# Patient Record
Sex: Male | Born: 1940
Health system: Southern US, Community
[De-identification: ages and names within clinical notes are randomized; demographics above are authoritative.]

## PROBLEM LIST (undated history)

## (undated) DIAGNOSIS — H9190 Unspecified hearing loss, unspecified ear: Secondary | ICD-10-CM

## (undated) DIAGNOSIS — Z720 Tobacco use: Secondary | ICD-10-CM

## (undated) DIAGNOSIS — Z860100 Personal history of colon polyps, unspecified: Secondary | ICD-10-CM

## (undated) DIAGNOSIS — J449 Chronic obstructive pulmonary disease, unspecified: Secondary | ICD-10-CM

## (undated) DIAGNOSIS — H02839 Dermatochalasis of unspecified eye, unspecified eyelid: Secondary | ICD-10-CM

## (undated) DIAGNOSIS — I10 Essential (primary) hypertension: Secondary | ICD-10-CM

## (undated) DIAGNOSIS — K760 Fatty (change of) liver, not elsewhere classified: Secondary | ICD-10-CM

## (undated) DIAGNOSIS — E7800 Pure hypercholesterolemia, unspecified: Secondary | ICD-10-CM

## (undated) DIAGNOSIS — R0989 Other specified symptoms and signs involving the circulatory and respiratory systems: Secondary | ICD-10-CM

## (undated) DIAGNOSIS — K635 Polyp of colon: Secondary | ICD-10-CM

## (undated) DIAGNOSIS — E119 Type 2 diabetes mellitus without complications: Secondary | ICD-10-CM

## (undated) DIAGNOSIS — J189 Pneumonia, unspecified organism: Secondary | ICD-10-CM

## (undated) DIAGNOSIS — Z87438 Personal history of other diseases of male genital organs: Secondary | ICD-10-CM

## (undated) DIAGNOSIS — M1990 Unspecified osteoarthritis, unspecified site: Secondary | ICD-10-CM

## (undated) HISTORY — DX: Polyp of colon: K63.5

## (undated) HISTORY — DX: Dermatochalasis of unspecified eye, unspecified eyelid: H02.839

## (undated) HISTORY — DX: Pure hypercholesterolemia, unspecified: E78.00

## (undated) HISTORY — PX: BLEPHAROPLASTY: SUR158

## (undated) HISTORY — PX: COLONOSCOPY: SHX174

## (undated) HISTORY — PX: CATARACT EXTRACTION W/ INTRAOCULAR LENS  IMPLANT, BILATERAL: SHX1307

## (undated) HISTORY — DX: Essential (primary) hypertension: I10

## (undated) HISTORY — DX: Tobacco use: Z72.0

## (undated) HISTORY — DX: Personal history of other diseases of male genital organs: Z87.438

## (undated) HISTORY — DX: Type 2 diabetes mellitus without complications: E11.9

## (undated) HISTORY — DX: Unspecified hearing loss, unspecified ear: H91.90

---

## 1999-08-20 ENCOUNTER — Encounter: Admission: RE | Admit: 1999-08-20 | Discharge: 1999-08-20 | Payer: Self-pay | Admitting: Family Medicine

## 1999-08-20 ENCOUNTER — Encounter: Payer: Self-pay | Admitting: Family Medicine

## 2004-04-01 ENCOUNTER — Ambulatory Visit: Payer: Self-pay | Admitting: Internal Medicine

## 2004-04-01 LAB — HM COLONOSCOPY

## 2004-04-14 ENCOUNTER — Ambulatory Visit: Payer: Self-pay | Admitting: Internal Medicine

## 2005-09-27 ENCOUNTER — Ambulatory Visit: Payer: Self-pay | Admitting: Cardiology

## 2010-10-02 ENCOUNTER — Encounter: Payer: Self-pay | Admitting: Family Medicine

## 2011-04-26 ENCOUNTER — Other Ambulatory Visit: Payer: Self-pay | Admitting: Family Medicine

## 2011-04-26 DIAGNOSIS — F17200 Nicotine dependence, unspecified, uncomplicated: Secondary | ICD-10-CM

## 2011-04-30 ENCOUNTER — Ambulatory Visit
Admission: RE | Admit: 2011-04-30 | Discharge: 2011-04-30 | Disposition: A | Discharge status: Home / Self Care | Payer: Medicare Other | Source: Ambulatory Visit | Attending: Family Medicine | Admitting: Family Medicine

## 2011-04-30 DIAGNOSIS — F17200 Nicotine dependence, unspecified, uncomplicated: Secondary | ICD-10-CM

## 2012-02-17 ENCOUNTER — Other Ambulatory Visit: Payer: Self-pay | Admitting: Family Medicine

## 2012-02-17 ENCOUNTER — Ambulatory Visit
Admission: RE | Admit: 2012-02-17 | Discharge: 2012-02-17 | Disposition: A | Discharge status: Home / Self Care | Payer: Medicare Other | Source: Ambulatory Visit | Attending: Family Medicine | Admitting: Family Medicine

## 2012-02-17 DIAGNOSIS — M545 Low back pain, unspecified: Secondary | ICD-10-CM

## 2012-06-26 ENCOUNTER — Ambulatory Visit: Payer: Self-pay | Admitting: Family Medicine

## 2012-06-30 ENCOUNTER — Ambulatory Visit (INDEPENDENT_AMBULATORY_CARE_PROVIDER_SITE_OTHER): Payer: Medicare Other | Admitting: Family Medicine

## 2012-06-30 ENCOUNTER — Encounter: Payer: Self-pay | Admitting: Family Medicine

## 2012-06-30 VITALS — BP 130/72 | HR 78 | Temp 98.2°F | Resp 14 | Wt 231.0 lb

## 2012-06-30 DIAGNOSIS — L309 Dermatitis, unspecified: Secondary | ICD-10-CM

## 2012-06-30 DIAGNOSIS — L259 Unspecified contact dermatitis, unspecified cause: Secondary | ICD-10-CM

## 2012-06-30 DIAGNOSIS — E119 Type 2 diabetes mellitus without complications: Secondary | ICD-10-CM | POA: Insufficient documentation

## 2012-06-30 MED ORDER — PREDNISONE 20 MG PO TABS: ORAL_TABLET | ORAL | Status: DC

## 2012-06-30 NOTE — Progress Notes (Signed)
  Subjective:    Patient ID: Scott Ward, male    DOB: [birth year], 72 y.o.   MRN: [id number]  HPI  Patient reports a one to two-month history of a rash on his back. It is very dry and pruritic. He did continue with over-the-counter cortisone cream with limited success. He now seems to be spreading to the dorsums of both forearms. He denies any contacts with an itchy rash. He denies any fevers. He denies any weight loss. Past Medical History  Diagnosis Date  . High cholesterol   . High blood pressure   . Nicotine abuse   . HOH (hard of hearing)   . Colon polyp   . History of BPH   . Diabetes mellitus without complication    Current Outpatient Prescriptions on File Prior to Visit  Medication Sig Dispense Refill  . aspirin 81 MG tablet Take 81 mg by mouth daily.        Marland Kitchen lisinopril (PRINIVIL,ZESTRIL) 20 MG tablet Take 20 mg by mouth daily.        . simvastatin (ZOCOR) 10 MG tablet Take 10 mg by mouth at bedtime.        . verapamil (VERELAN PM) 240 MG 24 hr capsule Take 240 mg by mouth at bedtime.         No current facility-administered medications on file prior to visit.   No Known Allergies History   Social History  . Marital Status: Divorced    Spouse Name: N/A    Number of Children: N/A  . Years of Education: N/A   Occupational History  . Not on file.   Social History Main Topics  . Smoking status: Current Every Day Smoker    Types: Cigarettes  . Smokeless tobacco: Not on file     Comment: 10 cigs a day  . Alcohol Use: No  . Drug Use: No  . Sexually Active: Not on file   Other Topics Concern  . Not on file   Social History Narrative  . No narrative on file     Review of Systems  All other systems reviewed and are negative.       Objective:   Physical Exam  Constitutional: He appears well-developed and well-nourished.  Cardiovascular: Normal rate and normal heart sounds.   Pulmonary/Chest: Effort normal. He has wheezes.   skin shows erythematous  plaques on his back, both shoulder blades and the dorsum of both forearms.  The plaques have lichenified striae and fine scale. They have an erythematous base. There are also numerous excoriations around the plaques..        Assessment & Plan:  1. Dermatitis This has the appearance of atopic dermatitis versus lichen sclerosis versus possible mycosis fungoides.  I explained to the patient I am not sure yet of the diagnosis. Recommended prednisone and 20 mg tablets. He is to take 3 tablets on days 1 and 2, 2 tabs on days 3 and 4, 1 tablet on day 5 and 6.  He is to return in one week if it is no better so that I can perform a skin biopsy to evaluate lesion further

## 2012-07-13 ENCOUNTER — Telehealth: Payer: Self-pay | Admitting: Family Medicine

## 2012-07-13 MED ORDER — PREDNISONE 20 MG PO TABS: ORAL_TABLET | ORAL | Status: DC

## 2012-07-13 NOTE — Telephone Encounter (Signed)
He could have 1 more prednisone taper pack.

## 2012-07-13 NOTE — Telephone Encounter (Signed)
Rx Refilled  

## 2012-07-13 NOTE — Telephone Encounter (Signed)
Pt wants to know if you can prescribe him another dose of prednisone please..states he is getting better but feels if he gets another dose then it should go away.

## 2012-08-09 ENCOUNTER — Other Ambulatory Visit: Payer: Self-pay | Admitting: Family Medicine

## 2012-10-11 ENCOUNTER — Telehealth: Payer: Self-pay | Admitting: Family Medicine

## 2012-10-11 MED ORDER — VERAPAMIL HCL ER 240 MG PO CP24: 240.0000 mg | ORAL_CAPSULE | Freq: Every day | ORAL | Status: DC

## 2012-10-11 MED ORDER — LISINOPRIL 20 MG PO TABS: ORAL_TABLET | ORAL | Status: DC

## 2012-10-11 NOTE — Telephone Encounter (Signed)
Rx Refilled  

## 2012-10-11 NOTE — Telephone Encounter (Addendum)
Lisinopril 20 mg tab 1 QD #90 Verapamil ER 240 mg tab 1 QD #90

## 2013-01-09 ENCOUNTER — Other Ambulatory Visit: Payer: Self-pay | Admitting: Family Medicine

## 2013-01-18 ENCOUNTER — Other Ambulatory Visit: Payer: Self-pay | Admitting: Family Medicine

## 2013-01-18 ENCOUNTER — Ambulatory Visit (INDEPENDENT_AMBULATORY_CARE_PROVIDER_SITE_OTHER): Payer: Medicare Other | Admitting: Family Medicine

## 2013-01-18 ENCOUNTER — Ambulatory Visit
Admission: RE | Admit: 2013-01-18 | Discharge: 2013-01-18 | Disposition: A | Discharge status: Home / Self Care | Payer: Medicare Other | Source: Ambulatory Visit | Attending: Family Medicine | Admitting: Family Medicine

## 2013-01-18 ENCOUNTER — Encounter: Payer: Self-pay | Admitting: Family Medicine

## 2013-01-18 VITALS — BP 136/74 | HR 80 | Temp 97.5°F | Resp 20 | Ht 71.0 in | Wt 234.0 lb

## 2013-01-18 DIAGNOSIS — Z125 Encounter for screening for malignant neoplasm of prostate: Secondary | ICD-10-CM

## 2013-01-18 DIAGNOSIS — E785 Hyperlipidemia, unspecified: Secondary | ICD-10-CM

## 2013-01-18 DIAGNOSIS — D485 Neoplasm of uncertain behavior of skin: Secondary | ICD-10-CM

## 2013-01-18 DIAGNOSIS — F17200 Nicotine dependence, unspecified, uncomplicated: Secondary | ICD-10-CM

## 2013-01-18 DIAGNOSIS — I10 Essential (primary) hypertension: Secondary | ICD-10-CM

## 2013-01-18 DIAGNOSIS — Z23 Encounter for immunization: Secondary | ICD-10-CM

## 2013-01-18 LAB — LIPID PANEL
HDL: 37 mg/dL — ABNORMAL LOW (ref >39)
Total CHOL/HDL Ratio: 3.6 Ratio

## 2013-01-18 LAB — CBC WITH DIFFERENTIAL/PLATELET
Basophils Absolute: 0 10*3/uL (ref 0.0–0.1)
Basophils Relative: 0 % (ref 0–1)
Eosinophils Absolute: 0.2 10*3/uL (ref 0.0–0.7)
Eosinophils Relative: 2 % (ref 0–5)
Lymphocytes Relative: 18 % (ref 12–46)
MCV: 92.9 fL (ref 78.0–100.0)
Platelets: 186 10*3/uL (ref 150–400)
RDW: 14.1 % (ref 11.5–15.5)
WBC: 8.4 10*3/uL (ref 4.0–10.5)

## 2013-01-18 LAB — COMPLETE METABOLIC PANEL WITH GFR
ALT: 21 U/L (ref 0–53)
Alkaline Phosphatase: 103 U/L (ref 39–117)
GFR, Est Non African American: 75 mL/min
Glucose, Bld: 130 mg/dL — ABNORMAL HIGH (ref 70–99)
Sodium: 142 mEq/L (ref 135–145)
Total Bilirubin: 0.5 mg/dL (ref 0.3–1.2)
Total Protein: 6.2 g/dL (ref 6.0–8.3)

## 2013-01-18 NOTE — Progress Notes (Signed)
Subjective:    Patient ID: Scott Ward, male    DOB: December 17, 1940, 72 y.o.   MRN: [id number]  HPI Patient is a 72 year old smoker with a history of hypertension hyperlipidemia and borderline diabetes mellitus. He is here for followup for his blood pressure and his cholesterol. He denies any chest pain shortness of breath or dyspnea on exertion. He is wheezing today on exam consistent with his long smoking habit and early emphysema. I saw him last in the summer and gave him a prednisone Dosepak for age eczema-like rash on his back. The rash subsided on prednisone and return immediately off it. He is again complaining of itching on his back. There are widespread erythematous slightly elevated plaques that are approximately 2-3 cm in size all over his back with fine white scale. He is overdue for colonoscopy. He is due for his prostate exam. Given his long smoking history he is also due for chest x-ray which is not had any more than 3 years. Past Medical History  Diagnosis Date  . High cholesterol   . High blood pressure   . Nicotine abuse   . HOH (hard of hearing)   . Colon polyp   . History of BPH   . Diabetes mellitus without complication    Current Outpatient Prescriptions on File Prior to Visit  Medication Sig Dispense Refill  . aspirin 81 MG tablet Take 81 mg by mouth daily.        Marland Kitchen lisinopril (PRINIVIL,ZESTRIL) 20 MG tablet TAKE 1 TABLET BY MOUTH DAILY  30 tablet  5  . simvastatin (ZOCOR) 10 MG tablet TAKE 1 TABLET AT BEDTIME  90 tablet  1  . verapamil (VERELAN PM) 240 MG 24 hr capsule Take 1 capsule (240 mg total) by mouth at bedtime.  30 capsule  5   No current facility-administered medications on file prior to visit.   No Known Allergies History   Social History  . Marital Status: Divorced    Spouse Name: N/A    Number of Children: N/A  . Years of Education: N/A   Occupational History  . Not on file.   Social History Main Topics  . Smoking status: Current Every Day Smoker     Types: Cigarettes  . Smokeless tobacco: Not on file     Comment: 10 cigs a day  . Alcohol Use: No  . Drug Use: No  . Sexual Activity: Not on file   Other Topics Concern  . Not on file   Social History Narrative  . No narrative on file   No family history on file.    Review of Systems  All other systems reviewed and are negative.       Objective:   Physical Exam  Vitals reviewed. Neck: Neck supple. No JVD present. No thyromegaly present.  Cardiovascular: Normal rate, regular rhythm and normal heart sounds.  Exam reveals no gallop and no friction rub.   No murmur heard. Pulmonary/Chest: Effort normal. No respiratory distress. He has wheezes. He has no rales. He exhibits no tenderness.  Abdominal: Soft. Bowel sounds are normal. He exhibits no distension. There is no tenderness. There is no rebound and no guarding.  Musculoskeletal: He exhibits no edema.  Lymphadenopathy:    He has no cervical adenopathy.  Skin: Rash noted. There is erythema.          Assessment & Plan:  1. HTN (hypertension) Patient's blood pressures well controlled. Continue his current medications at the present dosages. I will  check a CMP to evaluate his kidney function as well as his potassium. - COMPLETE METABOLIC PANEL WITH GFR - CBC with Differential  2. HLD (hyperlipidemia) Given his smoking history I recommend an LDL less than 100. I will check a fasting lipid panel today to see if he is at goal. I also want to recheck his fasting blood sugar to see if he now requires medication control his diabetes he - Lipid panel  3. Prostate cancer screening He is due for a PSA and declines digital rectal exam. He also declines a colonoscopy. - PSA, Medicare  4. Smoker unmotivated to quit I recommended smoking cessation. I will schedule the patient for chest x-ray to rule out pulmonary neoplasm. - DG Chest 2 View; Future  5. Neoplasm of uncertain behavior of skin I am unsure what the rash on  his back he is. It could be atopic dermatitis versus lichen planus versus mycosis fungoides. I anesthetized the lesion on his lower left flank with 0.1% lidocaine with epinephrine. Shave biopsy was performed and sent to pathology using sterile technique. Hemostasis was achieved with Drysol. Await the results of biopsy before determining the next course of treatment - Pathology  6. Need for prophylactic vaccination and inoculation against unspecified single disease - Flu Vaccine QUAD 36+ mos IM - Pneumococcal conjugate vaccine 13-valent IM

## 2013-01-24 ENCOUNTER — Encounter: Payer: Self-pay | Admitting: Family Medicine

## 2013-01-24 ENCOUNTER — Other Ambulatory Visit: Payer: Self-pay | Admitting: Family Medicine

## 2013-01-24 MED ORDER — MOMETASONE FUROATE 0.1 % EX CREA: 1.0000 "application " | TOPICAL_CREAM | Freq: Every day | CUTANEOUS | Status: DC

## 2013-01-24 NOTE — Telephone Encounter (Signed)
Med sent to pharmacy.

## 2013-07-04 ENCOUNTER — Other Ambulatory Visit: Payer: Self-pay | Admitting: Family Medicine

## 2013-07-04 NOTE — Telephone Encounter (Signed)
Medication refilled per protocol. 

## 2013-07-22 ENCOUNTER — Other Ambulatory Visit: Payer: Self-pay | Admitting: Family Medicine

## 2013-07-24 ENCOUNTER — Other Ambulatory Visit: Payer: Self-pay | Admitting: Family Medicine

## 2013-07-25 ENCOUNTER — Other Ambulatory Visit: Payer: Self-pay | Admitting: Family Medicine

## 2013-07-25 MED ORDER — VERAPAMIL HCL ER 240 MG PO TBCR: EXTENDED_RELEASE_TABLET | ORAL | Status: DC

## 2013-07-25 NOTE — Telephone Encounter (Signed)
Rx Refilled  

## 2013-10-01 ENCOUNTER — Other Ambulatory Visit: Payer: Self-pay | Admitting: Family Medicine

## 2013-10-12 ENCOUNTER — Encounter: Payer: Self-pay | Admitting: *Deleted

## 2013-10-15 ENCOUNTER — Encounter: Payer: Self-pay | Admitting: Neurology

## 2013-10-15 ENCOUNTER — Ambulatory Visit (INDEPENDENT_AMBULATORY_CARE_PROVIDER_SITE_OTHER): Payer: Medicare HMO | Admitting: Neurology

## 2013-10-15 VITALS — BP 154/85 | HR 79 | Ht 70.5 in | Wt 230.0 lb

## 2013-10-15 DIAGNOSIS — R5381 Other malaise: Secondary | ICD-10-CM

## 2013-10-15 DIAGNOSIS — R5383 Other fatigue: Secondary | ICD-10-CM

## 2013-10-15 DIAGNOSIS — R531 Weakness: Secondary | ICD-10-CM

## 2013-10-15 DIAGNOSIS — H02403 Unspecified ptosis of bilateral eyelids: Secondary | ICD-10-CM

## 2013-10-15 DIAGNOSIS — H02409 Unspecified ptosis of unspecified eyelid: Secondary | ICD-10-CM

## 2013-10-15 DIAGNOSIS — H538 Other visual disturbances: Secondary | ICD-10-CM

## 2013-10-15 NOTE — Progress Notes (Addendum)
[facility]    Provider:  Dr Jaynee Eagles Referring Provider: Susy Frizzle, MD Primary Care Physician:  Odette Fraction, MD  CC:  "Droopy Eyelids"  HPI:  Scott Ward is a 72 y.o. male here as a referral from Dr. Dennard Schaumann for ptosis and vision changes to rule out myasthenia gravis  Patient reports he has had "droopy eyelids" for 8 years with right>left.Unsure of setting it started in but no known inciting factors. Can't see unless he lifts his eyelids. Over the years ptosis has been getting worse, is severe. Has had one surgery so far to pin up his eyelids and now needs another. Son accompanies father and says the patient's father and siblings have also had droopy lids that occurred in their 45s as well. The son also feels his eyelids are starting to droop at the end of the day. In the morning the patient wakes up with the eyelid problems (right worse than left) and gets worse throughout the day. Vision is always blurry and gets worse throughout the day. No fatiguable chewing. No respiratory problems. Can't get out of a chair without using hands, Has difficulty walking up stairs and has to rest. Denies slurred speech or dysarthria. No changes in facial muscles or expression. Denies neck weakness. No other focal neurologic symptoms. Symptoms are progressive especially in the last 2 years with the right eyelid being more and more involved. No FHx of neurodegenerative disease or neuromuscular disease. Had cataract surgery previous to symptoms and doesn't feel that is a factor.   Reviewed notes, labs and imaging from outside physicians, which showed: Ophthalmology office visit from 10/01/2013 sites [facility] right 20/60, [facility] left 20/50. PERRL, no APD, normal slit lamp exam, normal fundoscopic exam,   Review of Systems: Patient complains of symptoms per HPI as well as the following symptoms blurry vision, eye lids, eye sight, and rash. Pertinent negatives per HPI. Otherwise out of a complete  14 system review, and all other reviewed systems are negative.   History   Social History  . Marital Status: Divorced    Spouse Name: N/A    Number of Children: 1  . Years of Education: 8   Occupational History  . RETIRED    Social History Main Topics  . Smoking status: Current Every Day Smoker -- 1.00 packs/day    Types: Cigarettes  . Smokeless tobacco: Never Used     Comment: 10 cigs a day  . Alcohol Use: No  . Drug Use: No  . Sexual Activity: Not on file   Other Topics Concern  . Not on file   Social History Narrative   Patient is single with one child.   Patient is left handed.   Patient has an 8 th grade education.   Patient drinks up to 3 cups daily.    No family history on file.Father died of heart attack. Grandfather and siblings and sons with droopy eyelids later in life. Otherwise no FHx of neuromuscular or neurodegenerative disorders.  Past Medical History  Diagnosis Date  . High cholesterol   . High blood pressure   . Nicotine abuse   . HOH (hard of hearing)   . Colon polyp   . History of BPH   . Diabetes mellitus without complication   . Dermatochalasis     Past Surgical History  Procedure Laterality Date  . Blepharoplasty    . Cataract extraction      Current Outpatient Prescriptions  Medication Sig Dispense Refill  . aspirin  81 MG tablet Take 81 mg by mouth daily.        Marland Kitchen lisinopril (PRINIVIL,ZESTRIL) 20 MG tablet TAKE 1 TABLET BY MOUTH DAILY  30 tablet  5  . mometasone (ELOCON) 0.1 % cream Apply 1 application topically daily.  45 g  1  . simvastatin (ZOCOR) 10 MG tablet TAKE 1 TABLET BY MOUTH AT BEDTIME  90 tablet  0  . verapamil (CALAN-SR) 240 MG CR tablet Take 240 mg by mouth daily. TAKE 1 TABLET AT BEDTIME       No current facility-administered medications for this visit.    Allergies as of 10/15/2013  . (No Known Allergies)    Vitals: BP 154/85  Pulse 79  Ht 5' 10.5" (1.791 m)  Wt 230 lb (104.327 kg)  BMI 32.52 kg/m2 Last  Weight:  Wt Readings from Last 1 Encounters:  10/15/13 230 lb (104.327 kg)   Last Height:   Ht Readings from Last 1 Encounters:  10/15/13 5' 10.5" (1.791 m)     Physical exam: Exam: Gen: NAD, conversant Eyes: anicteric sclerae, moist conjunctivae HENT: Atraumatic, oropharynx clear Neck: Trachea midline; supple,  Lungs: Decrease breath sounds at the bases, +rales                     CV: RRR, no MRG Abdomen: Soft, non-tender; obese Extremities: No peripheral edema  Skin: Normal temperature, no apparent rash,  Psych: Appropriate affect, pleasant   Neuro: Detailed Neurologic Exam   Speech:    Speech is normal; fluent and spontaneous with normal comprehension. No nasal speech.  Cognition:    The patient is oriented to person, place, and time;   Cranial Nerves:    The pupils are equal, round, and reactive to light. The fundi are normal. Visual fields are full to finger confrontation if eyelids are manually raised. Extraocular movements are intact. fatigueable ptosis with prolonged up gaze.  Trigeminal sensation is intact and the muscles of mastication are normal. Ptosis right > left otherwise the face is symmetric. Ice pack test left eye appears to be positive, right eye negative. The palate elevates in the midline. Voice is normal. Shoulder shrug is normal. The tongue has normal motion without fasciculations.   Coordination:    Normal finger to nose and heel to shin.   Gait:    Heel-toe and tandem gait are normal.     Motor Observation:    No asymmetry, no atrophy, and no involuntary movements noted. No pronator drift.  Tone:    Normal muscle tone.   Posture:    Posture is normal.     Strength: Mild bilateral hip flexion weakness otherwise strength is V/V in the upper and lower limbs.    Light Touch:    Normal light touch sensation in upper and lower extremities.   impaired right upper extremity.    Proprioception:    Normal proprioception in upper and lower  extremities. Romberg negative.  Reflex Exam:  DTR's:    Deep tendon reflexes in the upper extremities are brisk for age but symmetrical, and lower extremities are normal bilaterally.   Toes:    The toes are downgoing bilaterally.  Clonus:    Clonus is absent.     Assessment/plan: 73 year old male with a PMHx of HTN, HLD who is here for 8 years of worsening ptosis and blurred vision. He was referred for evaluation of Myasthenia Gravis. Patient does endorse worsening ptosis and blurry vision throughout the day as well as  weakness getting out of chairs, going up stairs and holding arms overhead. Denies dysphagia, dysarthria, changes in nasal quality or facial strength, respiratory symptoms or fatiguable chewing. Reports that there is a family history of late-onset ptosis in his son, brother, sister and father. Neuro exam significant for fatiguable ptosis and extra-occular muscles on prolonged upgaze and possibly improved left ptosis with ice-pack test in the office.  Will order workup for AchR antibodies and antibodies to rule out lambert eaton. If seronegative can consider late-onset congenital/hereditary myasthenic syndrome considering family history. Discussed EMG/NCS and they would prefer to wait for results of blood work to consider any further tests. Wil call son [name] at [phone]. Explained if patient has any respiratory difficulty or symptoms progress rapidly that they should proceed to the emergency room or call 911.    Harlene Salts, [facility] [address] [place], [place] [address]  Phone [phone] Fax [phone]   Lenor Coffin

## 2013-10-15 NOTE — Patient Instructions (Signed)
Overall you are doing fairly well but I do want to suggest a few things today:   Remember to drink plenty of fluid, eat healthy meals and do not skip any meals. Try to eat protein with a every meal and eat a healthy snack such as fruit or nuts in between meals. Try to keep a regular sleep-wake schedule and try to exercise daily, particularly in the form of walking, 20-30 minutes a day, if you can.    As far as diagnostic testing: please proceed to lab today   Please call us with any interim questions, concerns, problems, updates or refill requests.   I will call you so we can go over results with you on the phone and consider next steps.  My clinical assistant and I will answer any of your questions and relay your messages to me and also relay most of my messages to you.   Our phone number is [phone]. We also have an after hours call service for urgent matters and there is a physician on-call for urgent questions. For any emergencies you know to call 911 or go to the nearest emergency room

## 2013-10-20 LAB — ACETYLCHOLINE RECEPTOR, MODULATING: Acetylcholine Rec Mod Ab: <12 % (ref 0–20)

## 2013-10-20 LAB — ACETYLCHOLINE RECEPTOR, BINDING

## 2013-10-20 LAB — ACETYLCHOLINE RECEPTOR, BLOCKING: AChR Blocking Abs, Serum: 20 % (ref 0–25)

## 2013-10-20 LAB — VGCC ANTIBODY: VGCC ANTIBODY: NEGATIVE

## 2013-10-28 ENCOUNTER — Other Ambulatory Visit: Payer: Self-pay | Admitting: Family Medicine

## 2013-10-29 NOTE — Telephone Encounter (Signed)
Refill appropriate and filled per protocol. 

## 2014-01-05 ENCOUNTER — Other Ambulatory Visit: Payer: Self-pay | Admitting: Family Medicine

## 2014-01-07 NOTE — Telephone Encounter (Signed)
Med refilled for 30 days only, needs ov

## 2014-05-06 ENCOUNTER — Other Ambulatory Visit: Payer: Self-pay | Admitting: Family Medicine

## 2014-05-06 ENCOUNTER — Encounter: Payer: Self-pay | Admitting: Family Medicine

## 2014-05-06 NOTE — Telephone Encounter (Signed)
Pt has not been seen since 12/2012.  One month refill only.  Letter sent to make appt.

## 2014-05-17 ENCOUNTER — Other Ambulatory Visit (INDEPENDENT_AMBULATORY_CARE_PROVIDER_SITE_OTHER): Payer: Medicare HMO

## 2014-05-17 ENCOUNTER — Ambulatory Visit: Payer: Medicare HMO | Admitting: Family Medicine

## 2014-05-17 DIAGNOSIS — Z23 Encounter for immunization: Secondary | ICD-10-CM

## 2014-05-17 DIAGNOSIS — I10 Essential (primary) hypertension: Secondary | ICD-10-CM | POA: Diagnosis not present

## 2014-05-17 DIAGNOSIS — E119 Type 2 diabetes mellitus without complications: Secondary | ICD-10-CM | POA: Diagnosis not present

## 2014-05-17 DIAGNOSIS — Z0000 Encounter for general adult medical examination without abnormal findings: Secondary | ICD-10-CM | POA: Diagnosis not present

## 2014-05-17 LAB — LIPID PANEL
CHOLESTEROL: 145 mg/dL (ref 0–200)
HDL: 37 mg/dL — ABNORMAL LOW (ref >=40)
LDL Cholesterol: 78 mg/dL (ref 0–99)
TRIGLYCERIDES: 150 mg/dL — AB (ref <150)
Total CHOL/HDL Ratio: 3.9 Ratio
VLDL: 30 mg/dL (ref 0–40)

## 2014-05-17 LAB — CBC WITH DIFFERENTIAL/PLATELET
Basophils Absolute: 0 10*3/uL (ref 0.0–0.1)
Basophils Relative: 0 % (ref 0–1)
EOS ABS: 0.2 10*3/uL (ref 0.0–0.7)
Eosinophils Relative: 2 % (ref 0–5)
HEMATOCRIT: 49.5 % (ref 39.0–52.0)
HEMOGLOBIN: 16.9 g/dL (ref 13.0–17.0)
LYMPHS ABS: 2.1 10*3/uL (ref 0.7–4.0)
Lymphocytes Relative: 26 % (ref 12–46)
MCH: 31.8 pg (ref 26.0–34.0)
MCHC: 34.1 g/dL (ref 30.0–36.0)
MCV: 93 fL (ref 78.0–100.0)
MONO ABS: 0.7 10*3/uL (ref 0.1–1.0)
MONOS PCT: 9 % (ref 3–12)
MPV: 10.5 fL (ref 8.6–12.4)
Neutro Abs: 5 10*3/uL (ref 1.7–7.7)
Neutrophils Relative %: 63 % (ref 43–77)
Platelets: 191 10*3/uL (ref 150–400)
RBC: 5.32 MIL/uL (ref 4.22–5.81)
RDW: 14.3 % (ref 11.5–15.5)
WBC: 8 10*3/uL (ref 4.0–10.5)

## 2014-05-17 LAB — COMPLETE METABOLIC PANEL WITH GFR
ALBUMIN: 3.8 g/dL (ref 3.5–5.2)
ALK PHOS: 102 U/L (ref 39–117)
ALT: 18 U/L (ref 0–53)
AST: 17 U/L (ref 0–37)
BILIRUBIN TOTAL: 0.5 mg/dL (ref 0.2–1.2)
BUN: 17 mg/dL (ref 6–23)
CO2: 24 mEq/L (ref 19–32)
Calcium: 8.9 mg/dL (ref 8.4–10.5)
Chloride: 104 mEq/L (ref 96–112)
Creat: 0.84 mg/dL (ref 0.50–1.35)
GFR, EST NON AFRICAN AMERICAN: 86 mL/min
GFR, Est African American: >89 mL/min
GLUCOSE: 125 mg/dL — AB (ref 70–99)
POTASSIUM: 4.3 meq/L (ref 3.5–5.3)
Sodium: 140 mEq/L (ref 135–145)
Total Protein: 6.4 g/dL (ref 6.0–8.3)

## 2014-05-17 LAB — HEMOGLOBIN A1C
Hgb A1c MFr Bld: 6.8 % — ABNORMAL HIGH (ref <5.7)
MEAN PLASMA GLUCOSE: 148 mg/dL — AB (ref <117)

## 2014-05-18 LAB — PSA, MEDICARE: PSA: 1.35 ng/mL (ref <=4.00)

## 2014-05-20 ENCOUNTER — Ambulatory Visit (INDEPENDENT_AMBULATORY_CARE_PROVIDER_SITE_OTHER): Payer: Medicare HMO | Admitting: Family Medicine

## 2014-05-20 ENCOUNTER — Encounter: Payer: Self-pay | Admitting: Family Medicine

## 2014-05-20 VITALS — BP 136/84 | HR 80 | Temp 98.0°F | Resp 20 | Ht 71.0 in | Wt 235.0 lb

## 2014-05-20 DIAGNOSIS — Z23 Encounter for immunization: Secondary | ICD-10-CM

## 2014-05-20 DIAGNOSIS — E785 Hyperlipidemia, unspecified: Secondary | ICD-10-CM | POA: Diagnosis not present

## 2014-05-20 DIAGNOSIS — I10 Essential (primary) hypertension: Secondary | ICD-10-CM | POA: Diagnosis not present

## 2014-05-20 DIAGNOSIS — IMO0002 Reserved for concepts with insufficient information to code with codable children: Secondary | ICD-10-CM

## 2014-05-20 DIAGNOSIS — E1165 Type 2 diabetes mellitus with hyperglycemia: Secondary | ICD-10-CM | POA: Diagnosis not present

## 2014-05-20 NOTE — Addendum Note (Signed)
Addended by: Shary Decamp B on: 05/20/2014 03:55 PM   Modules accepted: Orders

## 2014-05-20 NOTE — Progress Notes (Signed)
Subjective:    Patient ID: Scott Ward, male    DOB: 08-24-1940, 74 y.o.   MRN: [id number]  HPI Patient is a very pleasant 74 year old white male who is here today for a checkup. His blood pressures well controlled at 136/84. He denies any chest pain shortness of breath or dyspnea on exertion. Unfortunately the patient continues to smoke. He denies any cough or hemoptysis. He is wheezing on examination today but he denies any shortness of breath. He is also on simvastatin 10 mg a day for hyperlipidemia. He denies any myalgias or right upper quadrant pain. His most recent lab work as listed below. His blood sugar test also continues to rise. His Hemoccult A1c is now up to 6.8. He is currently drinking a lot of sweets. Eating a lot of bread. Lab on 05/17/2014  Component Date Value Ref Range Status  . WBC 05/17/2014 8.0  4.0 - 10.5 K/uL Final  . RBC 05/17/2014 5.32  4.22 - 5.81 MIL/uL Final  . Hemoglobin 05/17/2014 16.9  13.0 - 17.0 g/dL Final  . HCT 05/17/2014 49.5  39.0 - 52.0 % Final  . MCV 05/17/2014 93.0  78.0 - 100.0 fL Final  . MCH 05/17/2014 31.8  26.0 - 34.0 pg Final  . MCHC 05/17/2014 34.1  30.0 - 36.0 g/dL Final  . RDW 05/17/2014 14.3  11.5 - 15.5 % Final  . Platelets 05/17/2014 191  150 - 400 K/uL Final  . MPV 05/17/2014 10.5  8.6 - 12.4 fL Final  . Neutrophils Relative % 05/17/2014 63  43 - 77 % Final  . Neutro Abs 05/17/2014 5.0  1.7 - 7.7 K/uL Final  . Lymphocytes Relative 05/17/2014 26  12 - 46 % Final  . Lymphs Abs 05/17/2014 2.1  0.7 - 4.0 K/uL Final  . Monocytes Relative 05/17/2014 9  3 - 12 % Final  . Monocytes Absolute 05/17/2014 0.7  0.1 - 1.0 K/uL Final  . Eosinophils Relative 05/17/2014 2  0 - 5 % Final  . Eosinophils Absolute 05/17/2014 0.2  0.0 - 0.7 K/uL Final  . Basophils Relative 05/17/2014 0  0 - 1 % Final  . Basophils Absolute 05/17/2014 0.0  0.0 - 0.1 K/uL Final  . Smear Review 05/17/2014 Criteria for review not met   Final  . Sodium 05/17/2014 140  135 -  145 mEq/L Final  . Potassium 05/17/2014 4.3  3.5 - 5.3 mEq/L Final  . Chloride 05/17/2014 104  96 - 112 mEq/L Final  . CO2 05/17/2014 24  19 - 32 mEq/L Final  . Glucose, Bld 05/17/2014 125* 70 - 99 mg/dL Final  . BUN 05/17/2014 17  6 - 23 mg/dL Final  . Creat 05/17/2014 0.84  0.50 - 1.35 mg/dL Final  . Total Bilirubin 05/17/2014 0.5  0.2 - 1.2 mg/dL Final  . Alkaline Phosphatase 05/17/2014 102  39 - 117 U/L Final  . AST 05/17/2014 17  0 - 37 U/L Final  . ALT 05/17/2014 18  0 - 53 U/L Final  . Total Protein 05/17/2014 6.4  6.0 - 8.3 g/dL Final  . Albumin 05/17/2014 3.8  3.5 - 5.2 g/dL Final  . Calcium 05/17/2014 8.9  8.4 - 10.5 mg/dL Final  . GFR, Est African American 05/17/2014 >89   Final  . GFR, Est Non African American 05/17/2014 86   Final   Comment:   The estimated GFR is a calculation valid for adults (>=9 years old) that uses the CKD-EPI algorithm to adjust for  age and sex. It is   not to be used for children, pregnant women, hospitalized patients,    patients on dialysis, or with rapidly changing kidney function. According to the NKDEP, eGFR >89 is normal, 60-89 shows mild impairment, 30-59 shows moderate impairment, 15-29 shows severe impairment and <15 is ESRD.     Marland Kitchen Cholesterol 05/17/2014 145  0 - 200 mg/dL Final   Comment: ATP III Classification:       < 200        mg/dL        Desirable      200 - 239     mg/dL        Borderline High      >= 240        mg/dL        High     . Triglycerides 05/17/2014 150* <150 mg/dL Final  . HDL 05/17/2014 37* >=40 mg/dL Final   ** Please note change in reference range(s). **  . Total CHOL/HDL Ratio 05/17/2014 3.9   Final  . VLDL 05/17/2014 30  0 - 40 mg/dL Final  . LDL Cholesterol 05/17/2014 78  0 - 99 mg/dL Final   Comment:   Total Cholesterol/HDL Ratio:CHD Risk                        Coronary Heart Disease Risk Table                                        Men       Women          1/2 Average Risk              3.4         3.3              Average Risk              5.0        4.4           2X Average Risk              9.6        7.1           3X Average Risk             23.4       11.0 Use the calculated Patient Ratio above and the CHD Risk table  to determine the patient's CHD Risk. ATP III Classification (LDL):       < 100        mg/dL         Optimal      100 - 129     mg/dL         Near or Above Optimal      130 - 159     mg/dL         Borderline High      160 - 189     mg/dL         High       > 190        mg/dL         Very High     . Hgb A1c MFr Bld 05/17/2014 6.8* <5.7 % Final   Comment:  According to the ADA Clinical Practice Recommendations for 2011, when HbA1c is used as a screening test:     >=6.5%   Diagnostic of Diabetes Mellitus            (if abnormal result is confirmed)   5.7-6.4%   Increased risk of developing Diabetes Mellitus   References:Diagnosis and Classification of Diabetes Mellitus,Diabetes PJAS,5053,97(QBHAL 1):S62-S69 and Standards of Medical Care in         Diabetes - 2011,Diabetes PFXT,0240,97 (Suppl 1):S11-S61.     . Mean Plasma Glucose 05/17/2014 148* <117 mg/dL Final  . PSA 05/17/2014 1.35  <=4.00 ng/mL Final   Comment: Test Methodology: ECLIA PSA (Electrochemiluminescence Immunoassay)   For PSA values from 2.5-4.0, particularly in younger men <60 years old, the AUA and NCCN suggest testing for % Free PSA (3515) and evaluation of the rate of increase in PSA (PSA velocity).    Past Medical History  Diagnosis Date  . High cholesterol   . High blood pressure   . Nicotine abuse   . HOH (hard of hearing)   . Colon polyp   . History of BPH   . Diabetes mellitus without complication   . Dermatochalasis    Past Surgical History  Procedure Laterality Date  . Blepharoplasty    . Cataract extraction     Current Outpatient Prescriptions on File Prior to Visit  Medication Sig Dispense Refill   . aspirin 81 MG tablet Take 81 mg by mouth daily.      Marland Kitchen lisinopril (PRINIVIL,ZESTRIL) 20 MG tablet TAKE 1 TABLET DAILY 30 tablet 0  . simvastatin (ZOCOR) 10 MG tablet TAKE 1 TABLET BY MOUTH AT BEDTIME 30 tablet 1  . verapamil (CALAN-SR) 240 MG CR tablet Take 240 mg by mouth daily. TAKE 1 TABLET AT BEDTIME     No current facility-administered medications on file prior to visit.   No Known Allergies History   Social History  . Marital Status: Divorced    Spouse Name: N/A  . Number of Children: 1  . Years of Education: 8   Occupational History  . RETIRED    Social History Main Topics  . Smoking status: Current Every Day Smoker -- 1.00 packs/day    Types: Cigarettes  . Smokeless tobacco: Never Used     Comment: 10 cigs a day  . Alcohol Use: No  . Drug Use: No  . Sexual Activity: Not on file   Other Topics Concern  . Not on file   Social History Narrative   Patient is single with one child.   Patient is left handed.   Patient has an 8 th grade education.   Patient drinks up to 3 cups daily.      Review of Systems  All other systems reviewed and are negative.      Objective:   Physical Exam  Constitutional: He appears well-developed and well-nourished. No distress.  HENT:  Right Ear: External ear normal.  Left Ear: External ear normal.  Nose: Nose normal.  Mouth/Throat: Oropharynx is clear and moist. No oropharyngeal exudate.  Neck: Neck supple. No JVD present. No thyromegaly present.  Cardiovascular: Normal rate, regular rhythm, normal heart sounds and intact distal pulses.   No murmur heard. Pulmonary/Chest: Effort normal and breath sounds normal. No respiratory distress. He has no wheezes. He has no rales.  Abdominal: Soft. Bowel sounds are normal. He exhibits no distension and no mass. There is no tenderness. There is no rebound and no guarding.  Musculoskeletal: He  exhibits no edema.  Lymphadenopathy:    He has no cervical adenopathy.  Skin: He is not  diaphoretic.  Vitals reviewed.         Assessment & Plan:  HLD (hyperlipidemia)  Benign essential HTN  Diabetes mellitus type II, uncontrolled  Patient's blood pressures well controlled. I will make no changes in his simvastatin. His blood pressure is adequately controlled on make no changes in his lisinopril or his verapamil. Unfortunately his blood sugar is elevated. His goal hemoglobin A1c is less than 6.5. We had a long discussion regarding low carbohydrate diet. I will recheck his hemoglobin A1c in 6 months. I've also recommended smoking cessation. Patient received Pneumovax 23 today in clinic. This completes his vaccination series. His PSA is normal. I did recommend a colonoscopy but the patient refused.

## 2014-06-12 ENCOUNTER — Other Ambulatory Visit: Payer: Self-pay | Admitting: Family Medicine

## 2014-06-12 NOTE — Telephone Encounter (Signed)
Refill appropriate and filled per protocol. 

## 2014-08-26 ENCOUNTER — Other Ambulatory Visit: Payer: Self-pay | Admitting: Family Medicine

## 2014-08-26 MED ORDER — LISINOPRIL 20 MG PO TABS: 20.0000 mg | ORAL_TABLET | Freq: Every day | ORAL | Status: DC

## 2014-08-26 MED ORDER — VERAPAMIL HCL ER 240 MG PO TBCR: 240.0000 mg | EXTENDED_RELEASE_TABLET | Freq: Every day | ORAL | Status: DC

## 2014-10-04 ENCOUNTER — Other Ambulatory Visit: Payer: Self-pay | Admitting: Family Medicine

## 2014-10-04 NOTE — Telephone Encounter (Signed)
Refill appropriate and filled per protocol. 

## 2014-10-15 ENCOUNTER — Other Ambulatory Visit: Payer: Self-pay | Admitting: Family Medicine

## 2014-10-15 NOTE — Telephone Encounter (Signed)
Refill appropriate and filled per protocol. 

## 2014-11-22 ENCOUNTER — Encounter: Payer: Self-pay | Admitting: Family Medicine

## 2014-11-22 ENCOUNTER — Ambulatory Visit (INDEPENDENT_AMBULATORY_CARE_PROVIDER_SITE_OTHER): Payer: Medicare HMO | Admitting: Family Medicine

## 2014-11-22 VITALS — BP 140/80 | HR 82 | Temp 97.8°F | Resp 20 | Ht 71.0 in | Wt 232.0 lb

## 2014-11-22 DIAGNOSIS — E119 Type 2 diabetes mellitus without complications: Secondary | ICD-10-CM

## 2014-11-22 DIAGNOSIS — E785 Hyperlipidemia, unspecified: Secondary | ICD-10-CM

## 2014-11-22 DIAGNOSIS — I10 Essential (primary) hypertension: Secondary | ICD-10-CM | POA: Diagnosis not present

## 2014-11-22 DIAGNOSIS — J438 Other emphysema: Secondary | ICD-10-CM

## 2014-11-22 DIAGNOSIS — Z23 Encounter for immunization: Secondary | ICD-10-CM

## 2014-11-22 LAB — CBC WITH DIFFERENTIAL/PLATELET
Basophils Absolute: 0.1 10*3/uL (ref 0.0–0.1)
Basophils Relative: 1 % (ref 0–1)
Eosinophils Absolute: 0.2 10*3/uL (ref 0.0–0.7)
Eosinophils Relative: 3 % (ref 0–5)
HEMATOCRIT: 47.9 % (ref 39.0–52.0)
HEMOGLOBIN: 16.6 g/dL (ref 13.0–17.0)
LYMPHS PCT: 19 % (ref 12–46)
Lymphs Abs: 1.6 10*3/uL (ref 0.7–4.0)
MCH: 32.2 pg (ref 26.0–34.0)
MCHC: 34.7 g/dL (ref 30.0–36.0)
MCV: 92.8 fL (ref 78.0–100.0)
MONO ABS: 0.7 10*3/uL (ref 0.1–1.0)
MONOS PCT: 8 % (ref 3–12)
MPV: 10.5 fL (ref 8.6–12.4)
NEUTROS ABS: 5.7 10*3/uL (ref 1.7–7.7)
Neutrophils Relative %: 69 % (ref 43–77)
Platelets: 203 10*3/uL (ref 150–400)
RBC: 5.16 MIL/uL (ref 4.22–5.81)
RDW: 14.7 % (ref 11.5–15.5)
WBC: 8.2 10*3/uL (ref 4.0–10.5)

## 2014-11-22 LAB — HEMOGLOBIN A1C
Hgb A1c MFr Bld: 7.2 % — ABNORMAL HIGH (ref <5.7)
MEAN PLASMA GLUCOSE: 160 mg/dL — AB (ref <117)

## 2014-11-22 LAB — COMPLETE METABOLIC PANEL WITH GFR
ALBUMIN: 3.9 g/dL (ref 3.6–5.1)
ALK PHOS: 98 U/L (ref 40–115)
ALT: 23 U/L (ref 9–46)
AST: 19 U/L (ref 10–35)
BILIRUBIN TOTAL: 0.5 mg/dL (ref 0.2–1.2)
BUN: 19 mg/dL (ref 7–25)
CALCIUM: 8.8 mg/dL (ref 8.6–10.3)
CO2: 28 mmol/L (ref 20–31)
CREATININE: 0.93 mg/dL (ref 0.70–1.18)
Chloride: 104 mmol/L (ref 98–110)
GFR, Est African American: >89 mL/min (ref >=60)
GFR, Est Non African American: 80 mL/min (ref >=60)
Glucose, Bld: 134 mg/dL — ABNORMAL HIGH (ref 70–99)
POTASSIUM: 4.2 mmol/L (ref 3.5–5.3)
Sodium: 139 mmol/L (ref 135–146)
Total Protein: 6.2 g/dL (ref 6.1–8.1)

## 2014-11-22 LAB — LIPID PANEL
CHOL/HDL RATIO: 3.2 ratio (ref <=5.0)
CHOLESTEROL: 134 mg/dL (ref 125–200)
HDL: 42 mg/dL (ref >=40)
LDL Cholesterol: 70 mg/dL (ref <130)
Triglycerides: 109 mg/dL (ref <150)
VLDL: 22 mg/dL (ref <30)

## 2014-11-22 MED ORDER — MOMETASONE FUROATE 0.1 % EX CREA: 1.0000 "application " | TOPICAL_CREAM | Freq: Every day | CUTANEOUS | Status: DC

## 2014-11-22 MED ORDER — ALBUTEROL SULFATE HFA 108 (90 BASE) MCG/ACT IN AERS
2.0000 | INHALATION_SPRAY | Freq: Four times a day (QID) | RESPIRATORY_TRACT | Status: DC | PRN
Start: 2014-11-22 — End: 2016-03-08

## 2014-11-22 NOTE — Progress Notes (Signed)
Subjective:    Patient ID: Scott Ward, male    DOB: Nov 29, 1940, 74 y.o.   MRN: [id number]  HPI 05/20/14 Patient is a very pleasant 74 year old white male who is here today for a checkup. His blood pressures well controlled at 136/84. He denies any chest pain shortness of breath or dyspnea on exertion. Unfortunately the patient continues to smoke. He denies any cough or hemoptysis. He is wheezing on examination today but he denies any shortness of breath. He is also on simvastatin 10 mg a day for hyperlipidemia. He denies any myalgias or right upper quadrant pain. His most recent lab work as listed below. His blood sugar test also continues to rise. His Hemoccult A1c is now up to 6.8. He is currently drinking a lot of sweets. Eating a lot of bread.  At thattime, my plkan was: Patient's blood pressures well controlled. I will make no changes in his simvastatin. His blood pressure is adequately controlled on make no changes in his lisinopril or his verapamil. Unfortunately his blood sugar is elevated. His goal hemoglobin A1c is less than 6.5. We had a long discussion regarding low carbohydrate diet. I will recheck his hemoglobin A1c in 6 months. I've also recommended smoking cessation. Patient received Pneumovax 23 today in clinic. This completes his vaccination series. His PSA is normal. I did recommend a colonoscopy but the patient refused. 11/22/14 Patient is here today for follow-up. He is not adhering to a low carbohydrate diet. He continues to smoke one pack of cigarettes per day. He is taking an aspirin 81 mg per day. He has audible wheezing today on his physical exam. He does report shortness of breath with exertion and easy fatigability. His blood pressure is adequately controlled at 140/80. He denies any chest pain or angina. He denies any myalgias or right upper quadrant pain. He denies any polyuria, polydipsia, or blurred vision Past Medical History  Diagnosis Date  . High cholesterol   .  High blood pressure   . Nicotine abuse   . HOH (hard of hearing)   . Colon polyp   . History of BPH   . Diabetes mellitus without complication   . Dermatochalasis    Past Surgical History  Procedure Laterality Date  . Blepharoplasty    . Cataract extraction     Current Outpatient Prescriptions on File Prior to Visit  Medication Sig Dispense Refill  . aspirin 81 MG tablet Take 81 mg by mouth daily.      Marland Kitchen lisinopril (PRINIVIL,ZESTRIL) 20 MG tablet Take 1 tablet (20 mg total) by mouth daily. 90 tablet 3  . simvastatin (ZOCOR) 10 MG tablet TAKE 1 TABLET BY MOUTH EVERY NIGHT AT BEDTIME 30 tablet 3  . verapamil (CALAN-SR) 240 MG CR tablet TAKE 1 TABLET BY MOUTH EVERY NIGHT AT BEDTIME 30 tablet 2   No current facility-administered medications on file prior to visit.   No Known Allergies Social History   Social History  . Marital Status: Divorced    Spouse Name: N/A  . Number of Children: 1  . Years of Education: 8   Occupational History  . RETIRED    Social History Main Topics  . Smoking status: Current Every Day Smoker -- 1.00 packs/day    Types: Cigarettes  . Smokeless tobacco: Never Used     Comment: 10 cigs a day  . Alcohol Use: No  . Drug Use: No  . Sexual Activity: Not on file   Other Topics Concern  .  Not on file   Social History Narrative   Patient is single with one child.   Patient is left handed.   Patient has an 8 th grade education.   Patient drinks up to 3 cups daily.      Review of Systems  All other systems reviewed and are negative.      Objective:   Physical Exam  Constitutional: He appears well-developed and well-nourished. No distress.  HENT:  Right Ear: External ear normal.  Left Ear: External ear normal.  Nose: Nose normal.  Mouth/Throat: Oropharynx is clear and moist. No oropharyngeal exudate.  Neck: Neck supple. No JVD present. No thyromegaly present.  Cardiovascular: Normal rate, regular rhythm, normal heart sounds and intact  distal pulses.   No murmur heard. Pulmonary/Chest: Effort normal and breath sounds normal. No respiratory distress. He has no wheezes. He has no rales.  Abdominal: Soft. Bowel sounds are normal. He exhibits no distension and no mass. There is no tenderness. There is no rebound and no guarding.  Musculoskeletal: He exhibits no edema.  Lymphadenopathy:    He has no cervical adenopathy.  Skin: He is not diaphoretic.  Vitals reviewed.         Assessment & Plan:  Diabetes mellitus type II, controlled - Plan: CBC with Differential/Platelet, COMPLETE METABOLIC PANEL WITH GFR, Lipid panel, Hemoglobin A1c, Microalbumin, urine, Flu Vaccine QUAD 36+ mos IM  Other emphysema - Plan: albuterol (PROVENTIL HFA;VENTOLIN HFA) 108 (90 BASE) MCG/ACT inhaler  Need for prophylactic vaccination and inoculation against influenza - Plan: Flu Vaccine QUAD 36+ mos IM  Benign essential HTN  HLD (hyperlipidemia)  I believe his shortness of breath is likely due to emphysema. I continue to strongly encourage the patient quit smoking. Meanwhile I'll start him on albuterol 2 puffs inhaled every 6 hours as needed. I will check a fasting lipid panel. Goal LDL cholesterol is less than 100. I will also check a hemoglobin A1c. Goal hemoglobin A1c is less than 6.5. I spent 5-10 minutes discussing a low carbohydrate diet. I believe his blood pressure is adequately controlled today at 140/80.  Patient's pneumonia vaccines are up-to-date. He received his flu shot today.

## 2014-11-23 LAB — MICROALBUMIN, URINE: Microalb, Ur: <0.2 mg/dL (ref <2.0)

## 2014-11-27 ENCOUNTER — Encounter: Payer: Self-pay | Admitting: Family Medicine

## 2014-11-29 ENCOUNTER — Telehealth: Payer: Self-pay | Admitting: *Deleted

## 2014-11-29 MED ORDER — METFORMIN HCL 500 MG PO TABS: 500.0000 mg | ORAL_TABLET | Freq: Two times a day (BID) | ORAL | Status: DC

## 2014-11-29 NOTE — Telephone Encounter (Signed)
Pt called stating that he called his pharmacy to see if his metformin was ready and they did not have it, I filled the medication while on phone and informed him that it is at pharmacy now.

## 2015-01-31 ENCOUNTER — Other Ambulatory Visit: Payer: Self-pay | Admitting: Family Medicine

## 2015-07-02 ENCOUNTER — Other Ambulatory Visit: Payer: Self-pay | Admitting: Family Medicine

## 2015-07-02 MED ORDER — SIMVASTATIN 10 MG PO TABS: 10.0000 mg | ORAL_TABLET | Freq: Every day | ORAL | Status: DC

## 2015-07-31 ENCOUNTER — Other Ambulatory Visit: Payer: Self-pay | Admitting: Family Medicine

## 2015-07-31 ENCOUNTER — Encounter: Payer: Self-pay | Admitting: Family Medicine

## 2015-07-31 NOTE — Telephone Encounter (Signed)
Medication refill for one time only.  Patient needs to be seen.  Letter sent for patient to call and schedule 

## 2015-08-15 ENCOUNTER — Encounter: Payer: Self-pay | Admitting: Family Medicine

## 2015-08-15 ENCOUNTER — Ambulatory Visit (INDEPENDENT_AMBULATORY_CARE_PROVIDER_SITE_OTHER): Payer: Medicare HMO | Admitting: Family Medicine

## 2015-08-15 VITALS — BP 136/76 | HR 80 | Temp 97.7°F | Resp 18 | Ht 71.0 in | Wt 229.0 lb

## 2015-08-15 DIAGNOSIS — E119 Type 2 diabetes mellitus without complications: Secondary | ICD-10-CM

## 2015-08-15 DIAGNOSIS — I10 Essential (primary) hypertension: Secondary | ICD-10-CM

## 2015-08-15 DIAGNOSIS — J438 Other emphysema: Secondary | ICD-10-CM

## 2015-08-15 DIAGNOSIS — E785 Hyperlipidemia, unspecified: Secondary | ICD-10-CM | POA: Diagnosis not present

## 2015-08-15 LAB — LIPID PANEL
CHOL/HDL RATIO: 3.5 ratio (ref <=5.0)
CHOLESTEROL: 159 mg/dL (ref 125–200)
HDL: 45 mg/dL (ref >=40)
LDL Cholesterol: 87 mg/dL (ref <130)
Triglycerides: 136 mg/dL (ref <150)
VLDL: 27 mg/dL (ref <30)

## 2015-08-15 LAB — CBC WITH DIFFERENTIAL/PLATELET
BASOS ABS: 0 {cells}/uL (ref 0–200)
BASOS PCT: 0 %
EOS ABS: 142 {cells}/uL (ref 15–500)
Eosinophils Relative: 2 %
HCT: 47.9 % (ref 38.5–50.0)
HEMOGLOBIN: 16.5 g/dL (ref 13.0–17.0)
Lymphocytes Relative: 19 %
Lymphs Abs: 1349 cells/uL (ref 850–3900)
MCH: 32 pg (ref 27.0–33.0)
MCHC: 34.4 g/dL (ref 32.0–36.0)
MCV: 93 fL (ref 80.0–100.0)
MPV: 10.5 fL (ref 7.5–12.5)
Monocytes Absolute: 781 cells/uL (ref 200–950)
Monocytes Relative: 11 %
NEUTROS ABS: 4828 {cells}/uL (ref 1500–7800)
Neutrophils Relative %: 68 %
Platelets: 197 10*3/uL (ref 140–400)
RBC: 5.15 MIL/uL (ref 4.20–5.80)
RDW: 14.3 % (ref 11.0–15.0)
WBC: 7.1 10*3/uL (ref 3.8–10.8)

## 2015-08-15 LAB — COMPLETE METABOLIC PANEL WITH GFR
ALBUMIN: 4 g/dL (ref 3.6–5.1)
ALK PHOS: 86 U/L (ref 40–115)
ALT: 16 U/L (ref 9–46)
AST: 16 U/L (ref 10–35)
BUN: 19 mg/dL (ref 7–25)
CALCIUM: 8.8 mg/dL (ref 8.6–10.3)
CO2: 27 mmol/L (ref 20–31)
Chloride: 104 mmol/L (ref 98–110)
Creat: 0.91 mg/dL (ref 0.70–1.18)
GFR, EST NON AFRICAN AMERICAN: 82 mL/min (ref >=60)
Glucose, Bld: 130 mg/dL — ABNORMAL HIGH (ref 70–99)
POTASSIUM: 4.3 mmol/L (ref 3.5–5.3)
Sodium: 140 mmol/L (ref 135–146)
Total Bilirubin: 0.4 mg/dL (ref 0.2–1.2)
Total Protein: 6.3 g/dL (ref 6.1–8.1)

## 2015-08-15 NOTE — Progress Notes (Signed)
   Subjective:    Patient ID: Scott Ward, male    DOB: 1940-09-22, 75 y.o.   MRN: [id number]  HPI Patient is here today for follow-up of his diabetes.  In September, his hemoglobin A1c had risen to 7.2 and at that time I started him on metformin. The patient discontinued metformin due to increased gas and bloating. He has been on no medication for 3 months. He is still taking his blood pressure medication and his cholesterol medication. Unfortunately he is still smoking. On his examination he is wheezing audibly bilaterally but he denies any shortness of breath or episodes of bronchitis and he has no desire to quit smoking. He is compliant with taking an aspirin. Past Medical History  Diagnosis Date  . High cholesterol   . High blood pressure   . Nicotine abuse   . HOH (hard of hearing)   . Colon polyp   . History of BPH   . Diabetes mellitus without complication ([place])   . Dermatochalasis    Past Surgical History  Procedure Laterality Date  . Blepharoplasty    . Cataract extraction     Current Outpatient Prescriptions on File Prior to Visit  Medication Sig Dispense Refill  . albuterol (PROVENTIL HFA;VENTOLIN HFA) 108 (90 BASE) MCG/ACT inhaler Inhale 2 puffs into the lungs every 6 (six) hours as needed for wheezing or shortness of breath. 1 Inhaler 0  . aspirin 81 MG tablet Take 81 mg by mouth daily.      Marland Kitchen lisinopril (PRINIVIL,ZESTRIL) 20 MG tablet Take 1 tablet (20 mg total) by mouth daily. 90 tablet 3  . metFORMIN (GLUCOPHAGE) 500 MG tablet TAKE 1 TABLET (500 MG TOTAL) BY MOUTH 2  TIMES DAILY WITH A MEAL. (Patient not taking: Reported on 08/15/2015) 60 tablet 3  . mometasone (ELOCON) 0.1 % cream Apply 1 application topically daily. 45 g 3  . simvastatin (ZOCOR) 10 MG tablet TAKE ONE TABLET BY MOUTH EVERY NIGHT AT BEDTIME 30 tablet 0  . verapamil (CALAN-SR) 240 MG CR tablet TAKE 1 TABLET BY MOUTH EVERY NIGHT AT BEDTIME 30 tablet 2   No current facility-administered medications on  file prior to visit.   No Known Allergies    Review of Systems  All other systems reviewed and are negative.      Objective:   Physical Exam  Cardiovascular: Normal rate, regular rhythm and normal heart sounds.   No murmur heard. Pulmonary/Chest: Effort normal and breath sounds normal. No respiratory distress. He has no wheezes. He has no rales.  Abdominal: Soft. Bowel sounds are normal. He exhibits no distension. There is no tenderness. There is no rebound.          Assessment & Plan:  Controlled type 2 diabetes mellitus without complication, without long-term current use of insulin ([place]) - Plan: COMPLETE METABOLIC PANEL WITH GFR, CBC with Differential/Platelet, Lipid panel, Microalbumin, urine, Hemoglobin A1c  Other emphysema ([facility])  Benign essential HTN  HLD (hyperlipidemia)  I will recheck the patient's hemoglobin A1c. If greater than 7, I would recommend Januvia. Recommended smoking cessation but he has no desire to quit smoking. His blood pressures well controlled. I will check a fasting lipid panel, his goal LDL cholesterol is less than 100.

## 2015-08-16 LAB — HEMOGLOBIN A1C
Hgb A1c MFr Bld: 6.4 % — ABNORMAL HIGH (ref <5.7)
MEAN PLASMA GLUCOSE: 137 mg/dL

## 2015-08-16 LAB — MICROALBUMIN, URINE: MICROALB UR: 1.4 mg/dL

## 2015-08-19 ENCOUNTER — Encounter: Payer: Self-pay | Admitting: Family Medicine

## 2015-09-03 ENCOUNTER — Other Ambulatory Visit: Payer: Self-pay | Admitting: Family Medicine

## 2015-09-03 NOTE — Telephone Encounter (Signed)
Refill appropriate and filled per protocol. 

## 2015-10-05 ENCOUNTER — Other Ambulatory Visit: Payer: Self-pay | Admitting: Family Medicine

## 2015-10-08 ENCOUNTER — Other Ambulatory Visit: Payer: Self-pay | Admitting: Family Medicine

## 2015-10-08 NOTE — Telephone Encounter (Signed)
Refill appropriate and filled per protocol. 

## 2015-12-19 ENCOUNTER — Other Ambulatory Visit: Payer: Self-pay | Admitting: Family Medicine

## 2015-12-19 MED ORDER — SIMVASTATIN 10 MG PO TABS: 10.0000 mg | ORAL_TABLET | Freq: Every day | ORAL | 0 refills | Status: DC

## 2015-12-19 MED ORDER — LISINOPRIL 20 MG PO TABS: 20.0000 mg | ORAL_TABLET | Freq: Every day | ORAL | 3 refills | Status: DC

## 2015-12-19 MED ORDER — METFORMIN HCL 500 MG PO TABS: ORAL_TABLET | ORAL | 0 refills | Status: DC

## 2015-12-19 NOTE — Telephone Encounter (Signed)
Medication called/sent to requested pharmacy  

## 2016-01-12 ENCOUNTER — Telehealth: Payer: Self-pay | Admitting: Family Medicine

## 2016-01-12 NOTE — Telephone Encounter (Signed)
Pt called and states that [facility] put him on metformin and he is allergic to it and when ask what it does to him he states it just gives him too much gas. He has not taken any in the last 2-4 months and does not want to take it as he states his last blood work was normal. He does need a f/u appt however he wanted to wait until the 1st of the year as he will have new ins. Appt make for January 2018. He is aware to come fasting.

## 2016-03-01 DIAGNOSIS — J189 Pneumonia, unspecified organism: Secondary | ICD-10-CM

## 2016-03-01 HISTORY — DX: Pneumonia, unspecified organism: J18.9

## 2016-03-02 ENCOUNTER — Inpatient Hospital Stay (HOSPITAL_COMMUNITY)
Admission: EM | Admit: 2016-03-02 | Discharge: 2016-03-08 | DRG: 193 | Disposition: A | Discharge status: Home / Self Care | Payer: Medicare HMO | Attending: Internal Medicine | Admitting: Internal Medicine

## 2016-03-02 ENCOUNTER — Emergency Department (HOSPITAL_COMMUNITY): Payer: Medicare HMO

## 2016-03-02 ENCOUNTER — Encounter (HOSPITAL_COMMUNITY): Payer: Self-pay

## 2016-03-02 DIAGNOSIS — E11649 Type 2 diabetes mellitus with hypoglycemia without coma: Secondary | ICD-10-CM | POA: Diagnosis present

## 2016-03-02 DIAGNOSIS — H9190 Unspecified hearing loss, unspecified ear: Secondary | ICD-10-CM | POA: Diagnosis present

## 2016-03-02 DIAGNOSIS — T380X5A Adverse effect of glucocorticoids and synthetic analogues, initial encounter: Secondary | ICD-10-CM | POA: Diagnosis present

## 2016-03-02 DIAGNOSIS — Z23 Encounter for immunization: Secondary | ICD-10-CM | POA: Diagnosis not present

## 2016-03-02 DIAGNOSIS — I10 Essential (primary) hypertension: Secondary | ICD-10-CM | POA: Diagnosis present

## 2016-03-02 DIAGNOSIS — Z791 Long term (current) use of non-steroidal anti-inflammatories (NSAID): Secondary | ICD-10-CM | POA: Diagnosis not present

## 2016-03-02 DIAGNOSIS — E785 Hyperlipidemia, unspecified: Secondary | ICD-10-CM | POA: Diagnosis present

## 2016-03-02 DIAGNOSIS — J181 Lobar pneumonia, unspecified organism: Secondary | ICD-10-CM

## 2016-03-02 DIAGNOSIS — Z7982 Long term (current) use of aspirin: Secondary | ICD-10-CM | POA: Diagnosis not present

## 2016-03-02 DIAGNOSIS — E119 Type 2 diabetes mellitus without complications: Secondary | ICD-10-CM | POA: Diagnosis not present

## 2016-03-02 DIAGNOSIS — R05 Cough: Secondary | ICD-10-CM | POA: Diagnosis not present

## 2016-03-02 DIAGNOSIS — J189 Pneumonia, unspecified organism: Secondary | ICD-10-CM | POA: Diagnosis not present

## 2016-03-02 DIAGNOSIS — J438 Other emphysema: Secondary | ICD-10-CM

## 2016-03-02 DIAGNOSIS — J9600 Acute respiratory failure, unspecified whether with hypoxia or hypercapnia: Secondary | ICD-10-CM | POA: Diagnosis present

## 2016-03-02 DIAGNOSIS — R69 Illness, unspecified: Secondary | ICD-10-CM | POA: Diagnosis not present

## 2016-03-02 DIAGNOSIS — F17210 Nicotine dependence, cigarettes, uncomplicated: Secondary | ICD-10-CM | POA: Diagnosis present

## 2016-03-02 DIAGNOSIS — J441 Chronic obstructive pulmonary disease with (acute) exacerbation: Secondary | ICD-10-CM | POA: Diagnosis present

## 2016-03-02 DIAGNOSIS — Z8249 Family history of ischemic heart disease and other diseases of the circulatory system: Secondary | ICD-10-CM | POA: Diagnosis not present

## 2016-03-02 DIAGNOSIS — Z9849 Cataract extraction status, unspecified eye: Secondary | ICD-10-CM | POA: Diagnosis not present

## 2016-03-02 DIAGNOSIS — Z888 Allergy status to other drugs, medicaments and biological substances status: Secondary | ICD-10-CM | POA: Diagnosis not present

## 2016-03-02 DIAGNOSIS — Z79899 Other long term (current) drug therapy: Secondary | ICD-10-CM

## 2016-03-02 DIAGNOSIS — R0902 Hypoxemia: Secondary | ICD-10-CM | POA: Diagnosis not present

## 2016-03-02 DIAGNOSIS — J440 Chronic obstructive pulmonary disease with acute lower respiratory infection: Secondary | ICD-10-CM | POA: Diagnosis present

## 2016-03-02 DIAGNOSIS — J9601 Acute respiratory failure with hypoxia: Secondary | ICD-10-CM | POA: Diagnosis present

## 2016-03-02 DIAGNOSIS — J449 Chronic obstructive pulmonary disease, unspecified: Secondary | ICD-10-CM | POA: Diagnosis not present

## 2016-03-02 HISTORY — DX: Pneumonia, unspecified organism: J18.9

## 2016-03-02 HISTORY — DX: Unspecified osteoarthritis, unspecified site: M19.90

## 2016-03-02 HISTORY — DX: Chronic obstructive pulmonary disease, unspecified: J44.9

## 2016-03-02 LAB — BASIC METABOLIC PANEL
ANION GAP: 11 (ref 5–15)
BUN: 25 mg/dL — AB (ref 6–20)
CHLORIDE: 104 mmol/L (ref 101–111)
CO2: 24 mmol/L (ref 22–32)
Calcium: 8.8 mg/dL — ABNORMAL LOW (ref 8.9–10.3)
Creatinine, Ser: 0.98 mg/dL (ref 0.61–1.24)
GFR calc Af Amer: >60 mL/min (ref >60)
GFR calc non Af Amer: >60 mL/min (ref >60)
Glucose, Bld: 135 mg/dL — ABNORMAL HIGH (ref 65–99)
POTASSIUM: 3.8 mmol/L (ref 3.5–5.1)
SODIUM: 139 mmol/L (ref 135–145)

## 2016-03-02 LAB — PROTIME-INR
INR: 1.12
PROTHROMBIN TIME: 14.4 s (ref 11.4–15.2)

## 2016-03-02 LAB — I-STAT ARTERIAL BLOOD GAS, ED
BICARBONATE: 24.3 mmol/L (ref 20.0–28.0)
O2 Saturation: 93 %
PO2 ART: 66 mmHg — AB (ref 83.0–108.0)
TCO2: 25 mmol/L (ref 0–100)
pCO2 arterial: 36.9 mmHg (ref 32.0–48.0)
pH, Arterial: 7.426 (ref 7.350–7.450)

## 2016-03-02 LAB — CBC
HEMATOCRIT: 44 % (ref 39.0–52.0)
HEMOGLOBIN: 15.3 g/dL (ref 13.0–17.0)
MCH: 32.3 pg (ref 26.0–34.0)
MCHC: 34.8 g/dL (ref 30.0–36.0)
MCV: 93 fL (ref 78.0–100.0)
Platelets: 184 10*3/uL (ref 150–400)
RBC: 4.73 MIL/uL (ref 4.22–5.81)
RDW: 13.8 % (ref 11.5–15.5)
WBC: 14.4 10*3/uL — AB (ref 4.0–10.5)

## 2016-03-02 LAB — GLUCOSE, CAPILLARY: Glucose-Capillary: 287 mg/dL — ABNORMAL HIGH (ref 65–99)

## 2016-03-02 LAB — BRAIN NATRIURETIC PEPTIDE: B NATRIURETIC PEPTIDE 5: 127.8 pg/mL — AB (ref 0.0–100.0)

## 2016-03-02 LAB — I-STAT CG4 LACTIC ACID, ED
LACTIC ACID, VENOUS: 1.41 mmol/L (ref 0.5–1.9)
Lactic Acid, Venous: 1.24 mmol/L (ref 0.5–1.9)

## 2016-03-02 MED ORDER — ALBUTEROL SULFATE (2.5 MG/3ML) 0.083% IN NEBU
5.0000 mg | INHALATION_SOLUTION | Freq: Once | RESPIRATORY_TRACT | Status: DC
  Filled 2016-03-02: qty 6

## 2016-03-02 MED ORDER — SODIUM CHLORIDE 0.9 % IV SOLN
INTRAVENOUS | Status: DC
  Administered 2016-03-02: 21:00:00 via INTRAVENOUS

## 2016-03-02 MED ORDER — ALBUTEROL SULFATE (2.5 MG/3ML) 0.083% IN NEBU
5.0000 mg | INHALATION_SOLUTION | Freq: Once | RESPIRATORY_TRACT | Status: AC
  Administered 2016-03-02: 5 mg via RESPIRATORY_TRACT
  Filled 2016-03-02: qty 6

## 2016-03-02 MED ORDER — ONDANSETRON HCL 4 MG PO TABS: 4.0000 mg | ORAL_TABLET | Freq: Four times a day (QID) | ORAL | Status: DC | PRN

## 2016-03-02 MED ORDER — POLYETHYLENE GLYCOL 3350 17 G PO PACK
17.0000 g | PACK | Freq: Every day | ORAL | Status: DC
  Administered 2016-03-03 – 2016-03-08 (×4): 17 g via ORAL
  Filled 2016-03-02 (×6): qty 1

## 2016-03-02 MED ORDER — ACETAMINOPHEN 650 MG RE SUPP: 650.0000 mg | Freq: Four times a day (QID) | RECTAL | Status: DC | PRN

## 2016-03-02 MED ORDER — ACETAMINOPHEN 325 MG PO TABS: 650.0000 mg | ORAL_TABLET | Freq: Four times a day (QID) | ORAL | Status: DC | PRN

## 2016-03-02 MED ORDER — HYDRALAZINE HCL 20 MG/ML IJ SOLN: 10.0000 mg | Freq: Four times a day (QID) | INTRAMUSCULAR | Status: DC | PRN

## 2016-03-02 MED ORDER — DEXTROSE 5 % IV SOLN
500.0000 mg | Freq: Once | INTRAVENOUS | Status: AC
  Administered 2016-03-02: 500 mg via INTRAVENOUS
  Filled 2016-03-02: qty 500

## 2016-03-02 MED ORDER — LISINOPRIL 10 MG PO TABS
20.0000 mg | ORAL_TABLET | Freq: Every day | ORAL | Status: DC
  Administered 2016-03-03 – 2016-03-08 (×6): 20 mg via ORAL
  Filled 2016-03-02 (×6): qty 2

## 2016-03-02 MED ORDER — INSULIN ASPART 100 UNIT/ML ~~LOC~~ SOLN
0.0000 [IU] | Freq: Three times a day (TID) | SUBCUTANEOUS | Status: DC
  Administered 2016-03-03: 2 [IU] via SUBCUTANEOUS
  Administered 2016-03-03: 5 [IU] via SUBCUTANEOUS
  Administered 2016-03-03 – 2016-03-04 (×3): 2 [IU] via SUBCUTANEOUS
  Administered 2016-03-04: 3 [IU] via SUBCUTANEOUS
  Administered 2016-03-05 (×2): 2 [IU] via SUBCUTANEOUS
  Administered 2016-03-06: 1 [IU] via SUBCUTANEOUS
  Administered 2016-03-06 – 2016-03-07 (×2): 3 [IU] via SUBCUTANEOUS
  Administered 2016-03-08: 1 [IU] via SUBCUTANEOUS

## 2016-03-02 MED ORDER — ALBUTEROL SULFATE (2.5 MG/3ML) 0.083% IN NEBU
2.5000 mg | INHALATION_SOLUTION | RESPIRATORY_TRACT | Status: DC | PRN
  Filled 2016-03-02: qty 3

## 2016-03-02 MED ORDER — IPRATROPIUM BROMIDE 0.02 % IN SOLN
0.5000 mg | RESPIRATORY_TRACT | Status: DC
  Administered 2016-03-02 – 2016-03-04 (×7): 0.5 mg via RESPIRATORY_TRACT
  Filled 2016-03-02 (×9): qty 2.5

## 2016-03-02 MED ORDER — METHYLPREDNISOLONE SODIUM SUCC 125 MG IJ SOLR
125.0000 mg | Freq: Once | INTRAMUSCULAR | Status: AC
  Administered 2016-03-02: 125 mg via INTRAVENOUS
  Filled 2016-03-02: qty 2

## 2016-03-02 MED ORDER — DEXTROSE 5 % IV SOLN
1.0000 g | Freq: Once | INTRAVENOUS | Status: AC
  Administered 2016-03-02: 1 g via INTRAVENOUS
  Filled 2016-03-02: qty 10

## 2016-03-02 MED ORDER — INFLUENZA VAC SPLIT QUAD 0.5 ML IM SUSY
0.5000 mL | PREFILLED_SYRINGE | INTRAMUSCULAR | Status: AC
  Administered 2016-03-03: 0.5 mL via INTRAMUSCULAR

## 2016-03-02 MED ORDER — SILVER SULFADIAZINE 1 % EX CREA
TOPICAL_CREAM | Freq: Two times a day (BID) | CUTANEOUS | Status: DC
  Administered 2016-03-03 (×3): via TOPICAL
  Administered 2016-03-04: 1 via TOPICAL
  Administered 2016-03-04 – 2016-03-06 (×5): via TOPICAL
  Filled 2016-03-02: qty 85

## 2016-03-02 MED ORDER — DEXTROSE 5 % IV SOLN
1.0000 g | INTRAVENOUS | Status: DC
  Administered 2016-03-03 – 2016-03-08 (×6): 1 g via INTRAVENOUS
  Filled 2016-03-02 (×6): qty 10

## 2016-03-02 MED ORDER — DEXTROSE 5 % IV SOLN
500.0000 mg | INTRAVENOUS | Status: DC
  Administered 2016-03-03 – 2016-03-06 (×3): 500 mg via INTRAVENOUS
  Filled 2016-03-02 (×6): qty 500

## 2016-03-02 MED ORDER — ALBUTEROL SULFATE (2.5 MG/3ML) 0.083% IN NEBU
2.5000 mg | INHALATION_SOLUTION | Freq: Once | RESPIRATORY_TRACT | Status: AC
  Administered 2016-03-02: 2.5 mg via RESPIRATORY_TRACT

## 2016-03-02 MED ORDER — ALBUTEROL SULFATE (2.5 MG/3ML) 0.083% IN NEBU
2.5000 mg | INHALATION_SOLUTION | RESPIRATORY_TRACT | Status: DC
  Administered 2016-03-02 – 2016-03-04 (×9): 2.5 mg via RESPIRATORY_TRACT
  Filled 2016-03-02 (×9): qty 3

## 2016-03-02 MED ORDER — ONDANSETRON HCL 4 MG/2ML IJ SOLN: 4.0000 mg | Freq: Four times a day (QID) | INTRAMUSCULAR | Status: DC | PRN

## 2016-03-02 MED ORDER — SIMVASTATIN 10 MG PO TABS
10.0000 mg | ORAL_TABLET | Freq: Every day | ORAL | Status: DC
  Administered 2016-03-02 – 2016-03-07 (×6): 10 mg via ORAL
  Filled 2016-03-02 (×6): qty 1

## 2016-03-02 MED ORDER — OXYCODONE HCL 5 MG PO TABS: 5.0000 mg | ORAL_TABLET | ORAL | Status: DC | PRN

## 2016-03-02 MED ORDER — ENOXAPARIN SODIUM 40 MG/0.4ML ~~LOC~~ SOLN
40.0000 mg | SUBCUTANEOUS | Status: DC
  Administered 2016-03-02 – 2016-03-07 (×6): 40 mg via SUBCUTANEOUS
  Filled 2016-03-02 (×6): qty 0.4

## 2016-03-02 MED ORDER — VERAPAMIL HCL ER 120 MG PO TBCR
120.0000 mg | EXTENDED_RELEASE_TABLET | Freq: Every day | ORAL | Status: DC
  Administered 2016-03-03 – 2016-03-07 (×5): 120 mg via ORAL
  Filled 2016-03-02 (×6): qty 1

## 2016-03-02 MED ORDER — SODIUM CHLORIDE 0.9% FLUSH
3.0000 mL | Freq: Two times a day (BID) | INTRAVENOUS | Status: DC
  Administered 2016-03-03 – 2016-03-08 (×8): 3 mL via INTRAVENOUS

## 2016-03-02 MED ORDER — METHYLPREDNISOLONE SODIUM SUCC 125 MG IJ SOLR
60.0000 mg | Freq: Four times a day (QID) | INTRAMUSCULAR | Status: DC
  Administered 2016-03-02 – 2016-03-04 (×7): 60 mg via INTRAVENOUS
  Filled 2016-03-02 (×7): qty 2

## 2016-03-02 MED ORDER — ASPIRIN EC 81 MG PO TBEC
81.0000 mg | DELAYED_RELEASE_TABLET | Freq: Every day | ORAL | Status: DC
  Administered 2016-03-02 – 2016-03-08 (×7): 81 mg via ORAL
  Filled 2016-03-02 (×7): qty 1

## 2016-03-02 MED ORDER — ALBUTEROL (5 MG/ML) CONTINUOUS INHALATION SOLN
10.0000 mg/h | INHALATION_SOLUTION | RESPIRATORY_TRACT | Status: AC
  Administered 2016-03-02: 10 mg/h via RESPIRATORY_TRACT
  Filled 2016-03-02: qty 20

## 2016-03-02 MED ORDER — SENNA 8.6 MG PO TABS
1.0000 | ORAL_TABLET | Freq: Two times a day (BID) | ORAL | Status: DC
  Administered 2016-03-02 – 2016-03-08 (×8): 8.6 mg via ORAL
  Filled 2016-03-02 (×11): qty 1

## 2016-03-02 MED ORDER — NICOTINE 14 MG/24HR TD PT24
14.0000 mg | MEDICATED_PATCH | Freq: Every day | TRANSDERMAL | Status: DC
  Administered 2016-03-02 – 2016-03-08 (×7): 14 mg via TRANSDERMAL
  Filled 2016-03-02 (×7): qty 1

## 2016-03-02 MED ORDER — ADULT MULTIVITAMIN W/MINERALS CH
1.0000 | ORAL_TABLET | Freq: Every day | ORAL | Status: DC
  Administered 2016-03-02 – 2016-03-08 (×7): 1 via ORAL
  Filled 2016-03-02 (×7): qty 1

## 2016-03-02 NOTE — H&P (Signed)
[facility] History and Physical  Scott Ward [id number] DOB: 29-Jan-1941 DOA: 03/02/2016   PCP: Odette Fraction, MD  Specialists: None  Chief Complaint: Shortness of breath and cough  HPI: Scott Ward is a 76 y.o. male with a past medical history of hypertension, diabetes, which is primarily diet controlled, tobacco abuse, no known lung disease, who was in his usual state of health till about a week ago when he started developing a cough. Then he became progressively short of breath. He was coughing up a greenish expectoration. Denies any blood in the sputum. He was wheezing. He thinks he may have had a fever but is not certain. Denies any sick contacts, although he did attend a few Christmas parties. Denies any travel anywhere. He does have some chest pain especially with cough. He does get leg swelling at the end of the day, but it goes away after he spends the night on a bed. He denies any nausea, vomiting. Has had some lightheadedness, but denies any syncopal episode. On Tuesday he was on the floor attempting to turn on his fireplace and then he couldn't get up due to weakness and had to call for help. He dragged his head, across the carpet and as a result sustained injury to his forehead. Today patient's son got concerned about the patient's breathing and so he took him to an urgent care center. From there he was sent to the emergency department for further evaluation.  In the emergency department, patient was found be wheezing. He was noted have oxygen saturation of 88%. He was tachypneic. Evaluation revealed a pneumonia in the right lower lobe. Patient also was thought to have an element of COPD exacerbation.  Home Medications: Prior to Admission medications   Medication Sig Start Date End Date Taking? Authorizing Provider  aspirin 81 MG tablet Take 81 mg by mouth daily.     Yes Historical Provider, MD  ibuprofen (ADVIL,MOTRIN) 200 MG tablet Take 400 mg by mouth every 8  (eight) hours as needed for moderate pain.   Yes Historical Provider, MD  lisinopril (PRINIVIL,ZESTRIL) 20 MG tablet Take 1 tablet (20 mg total) by mouth daily. 12/19/15  Yes Susy Frizzle, MD  Pseudoeph-Doxylamine-DM-APAP (DAYQUIL/NYQUIL COLD/FLU RELIEF PO) Take 2 capsules by mouth 2 (two) times daily as needed (cold).   Yes Historical Provider, MD  simvastatin (ZOCOR) 10 MG tablet Take 1 tablet (10 mg total) by mouth at bedtime. 12/19/15  Yes Susy Frizzle, MD  verapamil (CALAN-SR) 240 MG CR tablet TAKE 1 TABLET (240 MG TOTAL) BY MOUTH AT BEDTIME. 10/08/15  Yes Susy Frizzle, MD  albuterol (PROVENTIL HFA;VENTOLIN HFA) 108 (90 BASE) MCG/ACT inhaler Inhale 2 puffs into the lungs every 6 (six) hours as needed for wheezing or shortness of breath. 11/22/14   Susy Frizzle, MD    Allergies:  Allergies  Allergen Reactions  . Metformin And Related Other (See Comments)    Per pt gives him too much gas and refuses to take.    Past Medical History: Past Medical History:  Diagnosis Date  . Colon polyp   . Dermatochalasis   . Diabetes mellitus without complication ([place])   . High blood pressure   . High cholesterol   . History of BPH   . HOH (hard of hearing)   . Nicotine abuse     Past Surgical History:  Procedure Laterality Date  . BLEPHAROPLASTY    . CATARACT EXTRACTION      Social History: Patient  lives alone. His son and grandchildren take care of him out and help him out. He does drive himself. Usually independent with daily activities. He smokes one to one and half packs of cigarettes on a daily basis. Denies any alcohol use or illicit drug use.  Family History:  Family History  Problem Relation Age of Onset  . Heart attack Father   . Heart attack Sister   . Lung cancer Sister      Review of Systems - General ROS: positive for  - fatigue Psychological ROS: negative Ophthalmic ROS: negative ENT ROS: negative Allergy and Immunology ROS: negative Hematological and  Lymphatic ROS: negative Endocrine ROS: negative Respiratory ROS: As in history of present illness Cardiovascular ROS: As in history of present illness Gastrointestinal ROS: Constipated Genito-Urinary ROS: positive for - change in urinary stream Musculoskeletal ROS: negative Neurological ROS: no TIA or stroke symptoms Dermatological ROS: negative  Physical Examination  Vitals:   03/02/16 1630 03/02/16 1643 03/02/16 1645 03/02/16 1700  BP: 154/73   163/70  Pulse: 82  80 88  Resp: 25  (!) 29 (!) 31  Temp:      TempSrc:      SpO2: (!) 89% 92% 92% 92%  Weight:      Height:        BP 163/70   Pulse 88   Temp 98.8 F (37.1 C) (Oral)   Resp (!) 31   Ht 5\' 9"  (1.753 m)   Wt 107.5 kg (237 lb)   SpO2 92%   BMI 35.00 kg/m   General appearance: alert, cooperative, appears stated age and no distress Head: Abrasion noted over the forehead Eyes: conjunctivae/corneas clear. PERRL, EOM's intact.  Throat: lips, mucosa, and tongue normal; teeth and gums normal Neck: no adenopathy, no carotid bruit, no JVD, supple, symmetrical, trachea midline and thyroid not enlarged, symmetric, no tenderness/mass/nodules Back: symmetric, no curvature. ROM normal. No CVA tenderness. Resp: Coarse breath sounds bilaterally with the wheezing. No rhonchi. Few crackles at the right base Cardio: regular rate and rhythm, S1, S2 normal, no murmur, click, rub or gallop GI: soft, non-tender; bowel sounds normal; no masses,  no organomegaly Extremities: Edema is noted. Bilateral lower extremities. No erythema. Pulses are present. Pulses: 2+ and symmetric Skin: Skin color, texture, turgor normal. No rashes or lesions Lymph nodes: Cervical, supraclavicular, and axillary nodes normal. Neurologic: Cranial nerves 2-12 intact. Motor strength equal bilateral upper and lower extremities. He is awake and alert. Oriented 3.   Labs on Admission: I have personally reviewed following labs and imaging  studies  CBC:  Recent Labs Lab 03/02/16 1227  WBC 14.4*  HGB 15.3  HCT 44.0  MCV 93.0  PLT [id number]   Basic Metabolic Panel:  Recent Labs Lab 03/02/16 1227  NA 139  K 3.8  CL 104  CO2 24  GLUCOSE 135*  BUN 25*  CREATININE 0.98  CALCIUM 8.8*   GFR: Estimated Creatinine Clearance: 77.5 mL/min (by C-G formula based on SCr of 0.98 mg/dL).  Coagulation Profile:  Recent Labs Lab 03/02/16 1227  INR 1.12   Radiological Exams on Admission: Dg Chest 2 View  Result Date: 03/02/2016 CLINICAL DATA:  Productive cough with hypoxia. EXAM: CHEST  2 VIEW COMPARISON:  01/18/2013. FINDINGS: Upright AP and lateral radiographs demonstrates asymmetric opacity in the RIGHT middle lobe consistent with pneumonia. Findings represent an interval change from priors. Mild bibasilar subsegmental atelectasis. No effusion or pneumothorax. Low lung volumes accentuate cardiac size which could be mildly increased. Thoracic atherosclerosis.  No acute osseous findings. IMPRESSION: RIGHT middle lobe pneumonia. Electronically Signed   By: Staci Righter M.D.   On: 03/02/2016 14:46    My interpretation of Electrocardiogram: Sinus rhythm in the 70s. Normal axis. Intervals are normal. No Q waves. No concerning ST changes. And nonspecific T-wave changes.   Problem List  Principal Problem:   CAP (community acquired pneumonia) Active Problems:   Diabetes mellitus without complication ([place])   Acute respiratory failure (HCC)   Acute exacerbation of chronic obstructive pulmonary disease (COPD) ([place])   Essential hypertension   Assessment: This is a 76 year old Caucasian male with a past medical history as stated earlier, who presents with a one-week history of progressively worsening shortness of breath and cough. Patient is found to have pneumonia. There is also an element of COPD exacerbation. He has acute respiratory failure with hypoxia. He is afebrile here. Influenza is less likely.  Plan: #1 acute  respiratory failure with hypoxia: Secondary to COPD exacerbation and pneumonia. He is doing better on oxygen. ABG will be checked to help decide if patient needs to go to step down or if he can just go to telemetry floor.  #2 Community-acquired pneumonia: Patient has been given ceftriaxone and azithromycin. This will be continued. Urine for strep and legionella antigens.  #3 possible acute COPD exacerbation: Patient has a history of smoking. He's never been diagnosed formally with COPD, but he likely has it. He is wheezing. He'll be given Solu-Medrol, nebulizer treatments. Antibiotics as mentioned above. Smoking cessation counseling has been provided. Nicotine patch will be utilized.  #4 history of essential hypertension: Continue with his home medications. Hydralazine as needed.   #5 history of diabetes mellitus type 2: Patient had previously been on metformin. However, he has not tolerated this well. He is primarily diet controlled. Check CBGs. Check HbA1c. Since he will be on steroids, we will place him on a sliding scale coverage.  #6 Constipation: He reports that he hasn't had a bowel movement in a week. His been constipated on and off for a year. He has had a colonoscopy. We'll check TSH.   DVT Prophylaxis: Lovenox Code Status: Full code Family Communication: Discussed with the patient and his son  Disposition Plan: PT and OT evaluation will be obtained. Patient appears to be deconditioned. If he recovers quickly, he should be able to return home at discharge. Consults called: None  Admission status: Patient has acute respiratory failure with hypoxia and has COPD exacerbation and pneumonia. He will be inpatient status.      Further management decisions will depend on results of further testing and patient's response to treatment.   [facility]  [facility] Pager [phone]  If 7PM-7AM, please contact night-coverage [email or web address] Password [email or web address]  03/02/2016, 5:22 PM

## 2016-03-02 NOTE — ED Provider Notes (Addendum)
[facility] DEPT Provider Note   CSN: [id number] Arrival date & time: 03/02/16 1139     History    Chief Complaint  Patient presents with  . Cough     HPI Scott Ward is a 76 y.o. male.  76yo M w/ PMH including T2DM, HLD, HTN who p/w cough and shortness of breath. Patient reports a one-week history of persistent cough productive of yellow phlegm as well as progressively worsening shortness of breath. He reports intermittent subjective fevers at home. He denies any chest pain, vomiting, or diarrhea. He has had associated increased weakness over the past few days. No sick contacts or recent travel. Son states that he has never been formally worked up for COPD but he is suspicious of it because of his one pack per day smoking history.   Past Medical History:  Diagnosis Date  . Colon polyp   . Dermatochalasis   . Diabetes mellitus without complication ([place])   . High blood pressure   . High cholesterol   . History of BPH   . HOH (hard of hearing)   . Nicotine abuse      Patient Active Problem List   Diagnosis Date Noted  . Ptosis 10/15/2013  . Blurred vision, bilateral 10/15/2013  . Weakness 10/15/2013  . Diabetes mellitus without complication [facility])     Past Surgical History:  Procedure Laterality Date  . BLEPHAROPLASTY    . CATARACT EXTRACTION          Home Medications    Prior to Admission medications   Medication Sig Start Date End Date Taking? Authorizing Provider  aspirin 81 MG tablet Take 81 mg by mouth daily.     Yes Historical Provider, MD  ibuprofen (ADVIL,MOTRIN) 200 MG tablet Take 400 mg by mouth every 8 (eight) hours as needed for moderate pain.   Yes Historical Provider, MD  lisinopril (PRINIVIL,ZESTRIL) 20 MG tablet Take 1 tablet (20 mg total) by mouth daily. 12/19/15  Yes Susy Frizzle, MD  Pseudoeph-Doxylamine-DM-APAP (DAYQUIL/NYQUIL COLD/FLU RELIEF PO) Take 2 capsules by mouth 2 (two) times daily as needed (cold).   Yes Historical  Provider, MD  simvastatin (ZOCOR) 10 MG tablet Take 1 tablet (10 mg total) by mouth at bedtime. 12/19/15  Yes Susy Frizzle, MD  verapamil (CALAN-SR) 240 MG CR tablet TAKE 1 TABLET (240 MG TOTAL) BY MOUTH AT BEDTIME. 10/08/15  Yes Susy Frizzle, MD  albuterol (PROVENTIL HFA;VENTOLIN HFA) 108 (90 BASE) MCG/ACT inhaler Inhale 2 puffs into the lungs every 6 (six) hours as needed for wheezing or shortness of breath. 11/22/14   Susy Frizzle, MD      Family History  Problem Relation Age of Onset  . Heart attack Father      Social History  Substance Use Topics  . Smoking status: Current Every Day Smoker    Packs/day: 1.00    Types: Cigarettes  . Smokeless tobacco: Never Used     Comment: 10 cigs a day  . Alcohol use No     Allergies     Metformin and related    Review of Systems  10 Systems reviewed and are negative for acute change except as noted in the HPI.   Physical Exam Updated Vital Signs BP 153/91   Pulse 90   Temp 98.8 F (37.1 C) (Oral)   Resp (!) 30   Ht 5\' 9"  (1.753 m)   Wt 237 lb (107.5 kg)   SpO2 92%   BMI 35.00 kg/m  Physical Exam  Constitutional: He is oriented to person, place, and time. He appears well-developed and well-nourished. No distress.  uncomfortable  HENT:  Head: Normocephalic and atraumatic.  Moist mucous membranes  Eyes: Conjunctivae are normal. Pupils are equal, round, and reactive to light.  Eyes watering, clear drainage b/l  Neck: Neck supple.  Cardiovascular: Normal rate, regular rhythm and normal heart sounds.   No murmur heard. Pulmonary/Chest:  Increased WOB w/ tachypnea, crackles and rhonchi b/l, occasional end-expiratory wheeze  Abdominal: Soft. Bowel sounds are normal. He exhibits no distension. There is no tenderness.  Musculoskeletal: He exhibits edema (1+ pitting edema BLE).  Neurological: He is alert and oriented to person, place, and time.  Fluent speech  Skin: Skin is warm and dry.  Psychiatric: He has  a normal mood and affect. Judgment normal.  Nursing note and vitals reviewed.     ED Treatments / Results  Labs (all labs ordered are listed, but only abnormal results are displayed) Labs Reviewed  BASIC METABOLIC PANEL - Abnormal; Notable for the following:       Result Value   Glucose, Bld 135 (*)    BUN 25 (*)    Calcium 8.8 (*)    All other components within normal limits  CBC - Abnormal; Notable for the following:    WBC 14.4 (*)    All other components within normal limits  PROTIME-INR  BRAIN NATRIURETIC PEPTIDE  I-STAT CG4 LACTIC ACID, ED  I-STAT CG4 LACTIC ACID, ED     EKG  EKG Interpretation  Date/Time:  Tuesday March 02 2016 12:26:34 EST Ventricular Rate:  79 PR Interval:  138 QRS Duration: 102 QT Interval:  412 QTC Calculation: 472 R Axis:   28 Text Interpretation:  Normal sinus rhythm Normal ECG No previous ECGs available Confirmed by Taela Charbonneau MD, Jaylynn Siefert [id number]) on 03/02/2016 1:04:36 PM         Radiology Dg Chest 2 View  Result Date: 03/02/2016 CLINICAL DATA:  Productive cough with hypoxia. EXAM: CHEST  2 VIEW COMPARISON:  01/18/2013. FINDINGS: Upright AP and lateral radiographs demonstrates asymmetric opacity in the RIGHT middle lobe consistent with pneumonia. Findings represent an interval change from priors. Mild bibasilar subsegmental atelectasis. No effusion or pneumothorax. Low lung volumes accentuate cardiac size which could be mildly increased. Thoracic atherosclerosis. No acute osseous findings. IMPRESSION: RIGHT middle lobe pneumonia. Electronically Signed   By: Staci Righter M.D.   On: 03/02/2016 14:46    Procedures Procedures (including critical care time) .Critical Care Performed by: Sharlett Iles Authorized by: Sharlett Iles   Critical care provider statement:    Critical care time (minutes):  45   Critical care time was exclusive of:  Separately billable procedures and treating other patients   Critical care was  necessary to treat or prevent imminent or life-threatening deterioration of the following conditions:  Respiratory failure   Critical care was time spent personally by me on the following activities:  Development of treatment plan with patient or surrogate, evaluation of patient's response to treatment, examination of patient, obtaining history from patient or surrogate, ordering and performing treatments and interventions, ordering and review of laboratory studies, ordering and review of radiographic studies, pulse oximetry, re-evaluation of patient's condition and review of old charts    Medications Ordered in ED  Medications  azithromycin (ZITHROMAX) 500 mg in dextrose 5 % 250 mL IVPB (500 mg Intravenous New Bag/Given 03/02/16 1443)  albuterol (PROVENTIL) (2.5 MG/3ML) 0.083% nebulizer solution 5 mg (5 mg Nebulization  Given 03/02/16 1332)  cefTRIAXone (ROCEPHIN) 1 g in dextrose 5 % 50 mL IVPB (0 g Intravenous Stopped 03/02/16 1442)     Initial Impression / Assessment and Plan / ED Course  I have reviewed the triage vital signs and the nursing notes.  Pertinent labs & imaging results that were available during my care of the patient were reviewed by me and considered in my medical decision making (see chart for details).  Clinical Course     Pt w/ progressively worsening SOB and cough w/ fevers. On exam, he was tachypneic w/ crackles and rhonchi b/l, no respiratory distress. 88% on RA in triage, placed on 2L [place]. S x-ray does show right middle lobe pneumonia. Labs show normal lactate, CBC 14.4. Gave the patient ceftriaxone and azithromycin to treat CAP. The patient received 2 separate albuterol treatments and on reexamination he had much more pronounced wheezing. I think that he is moving better air and then his response I suspect possible COPD exacerbation underlying his pneumonia. Gave Solu-Medrol and placed on 1 hour continuous albuterol. He remains stable with mild improvement of symptoms on  reexamination. I discussed admission with [facility], Dr. [place] Pink; I appreciate his assistance with patient's care. He will admit the patient for further treatment.  Final Clinical Impressions(s) / ED Diagnoses   Final diagnoses:  Community acquired pneumonia of right middle lobe of lung ([place])  COPD exacerbation ([place])  Hypoxia     New Prescriptions   No medications on file       Sharlett Iles, MD 03/02/16 [place], MD 03/02/16 1746

## 2016-03-02 NOTE — ED Triage Notes (Signed)
Pt presents from [facility] for bilateral PNA. Pt has productive cough with yellow mucus. Pt son reports increased weakness over past few days. Pt is on RA at home, every day smoker. Pt O2 sats 88% on RA on arrival.

## 2016-03-02 NOTE — ED Notes (Signed)
Patient transported to X-ray 

## 2016-03-02 NOTE — ED Notes (Signed)
Placed patient on oxygen [place] (2L)

## 2016-03-03 ENCOUNTER — Ambulatory Visit: Payer: Medicare HMO | Admitting: Physician Assistant

## 2016-03-03 DIAGNOSIS — J189 Pneumonia, unspecified organism: Principal | ICD-10-CM

## 2016-03-03 LAB — COMPREHENSIVE METABOLIC PANEL
ALBUMIN: 2.7 g/dL — AB (ref 3.5–5.0)
ALT: 42 U/L (ref 17–63)
ANION GAP: 9 (ref 5–15)
AST: 52 U/L — AB (ref 15–41)
Alkaline Phosphatase: 77 U/L (ref 38–126)
BUN: 26 mg/dL — AB (ref 6–20)
CHLORIDE: 105 mmol/L (ref 101–111)
CO2: 27 mmol/L (ref 22–32)
Calcium: 8.9 mg/dL (ref 8.9–10.3)
Creatinine, Ser: 1.02 mg/dL (ref 0.61–1.24)
GFR calc Af Amer: >60 mL/min (ref >60)
GFR calc non Af Amer: >60 mL/min (ref >60)
GLUCOSE: 240 mg/dL — AB (ref 65–99)
POTASSIUM: 3.9 mmol/L (ref 3.5–5.1)
SODIUM: 141 mmol/L (ref 135–145)
TOTAL PROTEIN: 6.1 g/dL — AB (ref 6.5–8.1)
Total Bilirubin: 0.4 mg/dL (ref 0.3–1.2)

## 2016-03-03 LAB — CBC
HEMATOCRIT: 41.5 % (ref 39.0–52.0)
HEMOGLOBIN: 14.2 g/dL (ref 13.0–17.0)
MCH: 31.7 pg (ref 26.0–34.0)
MCHC: 34.2 g/dL (ref 30.0–36.0)
MCV: 92.6 fL (ref 78.0–100.0)
Platelets: 192 10*3/uL (ref 150–400)
RBC: 4.48 MIL/uL (ref 4.22–5.81)
RDW: 13.8 % (ref 11.5–15.5)
WBC: 14.6 10*3/uL — ABNORMAL HIGH (ref 4.0–10.5)

## 2016-03-03 LAB — TSH: TSH: 0.18 u[IU]/mL — ABNORMAL LOW (ref 0.350–4.500)

## 2016-03-03 LAB — HIV ANTIBODY (ROUTINE TESTING W REFLEX): HIV SCREEN 4TH GENERATION: NONREACTIVE

## 2016-03-03 LAB — GLUCOSE, CAPILLARY
GLUCOSE-CAPILLARY: 283 mg/dL — AB (ref 65–99)
Glucose-Capillary: 193 mg/dL — ABNORMAL HIGH (ref 65–99)
Glucose-Capillary: 189 mg/dL — ABNORMAL HIGH (ref 65–99)

## 2016-03-03 MED ORDER — IPRATROPIUM BROMIDE 0.02 % IN SOLN: 0.5000 mg | RESPIRATORY_TRACT | Status: DC | PRN

## 2016-03-03 MED ORDER — INSULIN ASPART 100 UNIT/ML ~~LOC~~ SOLN
3.0000 [IU] | Freq: Three times a day (TID) | SUBCUTANEOUS | Status: DC
  Administered 2016-03-03 – 2016-03-08 (×13): 3 [IU] via SUBCUTANEOUS

## 2016-03-03 NOTE — Progress Notes (Signed)
New Admission Note:   Arrival Method: From ED via stretcher  Mental Orientation: AOx4 Telemetry: box 2w11 Assessment: Completed Skin: Intact IV: R AC Pain: denies pain Tubes: None Safety Measures: Safety Fall Prevention Plan has been discussed  Admission:  2 [place] Orientation: Patient has been orientated to the room, unit and staff.  Family: Son at bedside  Orders to be reviewed and implemented. Will continue to monitor the patient. Call light has been placed within reach and bed alarm has been activated. Tele Box applied. CCMD notified.    Isac Caddy, RN

## 2016-03-03 NOTE — Evaluation (Signed)
Occupational Therapy Evaluation Patient Details Name: Scott Ward MRN: [id number] DOB: 1940/06/11 Today's Date: 03/03/2016    History of Present Illness Scott Ward is a 76 y.o. male with a past medical history of hypertension, diabetes, which is primarily diet controlled, tobacco abuse, no known lung disease, who was in his usual state of health till about a week ago when he started developing a cough. This was followed by an episode of weakness. In ED he was tachypneic with O2 sat 88% and chest x-ray suggestive of PNA.    Clinical Impression   Pt reports he was independent with ADL PTA. Currently pt min assist for ADL and functional mobility due to balance deficits and fatigue with activity. SpO2=89% on 4L O2 via [place] during activity. Pt planning to d/c home with intermittent supervision from family. Recommending HHOT and 24/7 supervision for follow up to maximize independence and safety with ADL and functional mobility upon return home. Pt would benefit from continued skilled OT to address established goals.    Follow Up Recommendations  Home health OT;Supervision/Assistance - 24 hour    Equipment Recommendations  Tub/shower seat    Recommendations for Other Services       Precautions / Restrictions Precautions Precautions: Fall Precaution Comments: watch O2 sats Restrictions Weight Bearing Restrictions: No      Mobility Bed Mobility               General bed mobility comments: Pt OOB in chair upon arrival  Transfers Overall transfer level: Needs assistance Equipment used: Rolling walker (2 wheeled) Transfers: Sit to/from Stand Sit to Stand: Min guard         General transfer comment: Min guard for sit to stand from chair. Good hand placement and technique. Min assist for balance once in standing.    Balance Overall balance assessment: Needs assistance Sitting-balance support: Feet supported;No upper extremity supported Sitting balance-Leahy Scale: Good      Standing balance support: Single extremity supported;During functional activity Standing balance-Leahy Scale: Poor Standing balance comment: Required single UE support and min assist for standing balance when voiding in urinal                            ADL Overall ADL's : Needs assistance/impaired Eating/Feeding: Set up;Sitting   Grooming: Minimal assistance;Standing Grooming Details (indicate cue type and reason): Min assist for standing balance Upper Body Bathing: Set up;Supervision/ safety;Sitting   Lower Body Bathing: Minimal assistance;Sit to/from stand   Upper Body Dressing : Set up;Supervision/safety;Sitting   Lower Body Dressing: Minimal assistance;Sit to/from stand Lower Body Dressing Details (indicate cue type and reason): Pt able to adjust socks in sitting. Min assist for balance in standing Toilet Transfer: Minimal assistance;Ambulation;RW Toilet Transfer Details (indicate cue type and reason): Simulated by sit to stand from chair with functional mobility. Pt able to stand and void in urinal with min assist for balance and single extremity supported.          Functional mobility during ADLs: Minimal assistance;Rolling walker General ADL Comments: SpO2=89% on 4L O2 following activity. Discussed potential need for 24/7 supervision upon return home; pts son reports they are working on setting up increased supervision.     Vision     Perception     Praxis      Pertinent Vitals/Pain Pain Assessment: No/denies pain     Hand Dominance     Extremity/Trunk Assessment Upper Extremity Assessment Upper Extremity Assessment: Generalized  weakness   Lower Extremity Assessment Lower Extremity Assessment: Defer to PT evaluation   Cervical / Trunk Assessment Cervical / Trunk Assessment: Kyphotic   Communication Communication Communication: No difficulties   Cognition Arousal/Alertness: Awake/alert Behavior During Therapy: WFL for tasks  assessed/performed Overall Cognitive Status: Within Functional Limits for tasks assessed                     General Comments       Exercises       Shoulder Instructions      Home Living Family/patient expects to be discharged to:: Private residence Living Arrangements: Alone Available Help at Discharge: Family;Available PRN/intermittently Type of Home: House Home Access: Stairs to enter [employer] of Steps: 2   Home Layout: One level     Bathroom Shower/Tub: Tub/shower unit Shower/tub characteristics: Architectural technologist:  (one standard, one handicapped)     Home Equipment: None   Additional Comments: son reports they are working on being able to provide pt with increased supervision upon return home. Son has access to RW and shower chair for pt.      Prior Functioning/Environment Level of Independence: Independent        Comments: pt drives, cooks, cleans        OT Problem List: Decreased strength;Decreased activity tolerance;Impaired balance (sitting and/or standing);Decreased knowledge of use of DME or AE;Decreased knowledge of precautions;Cardiopulmonary status limiting activity   OT Treatment/Interventions: Self-care/ADL training;Energy conservation;DME and/or AE instruction;Therapeutic activities;Patient/family education;Balance training    OT Goals(Current goals can be found in the care plan section) Acute Rehab OT Goals Patient Stated Goal: return home OT Goal Formulation: With patient/family Time For Goal Achievement: 03/17/16 Potential to Achieve Goals: Good ADL Goals Pt Will Perform Upper Body Bathing: sitting;with supervision Pt Will Perform Lower Body Bathing: with supervision;sit to/from stand Pt Will Perform Upper Body Dressing: with supervision;sitting (with gathering clothing items) Pt Will Perform Lower Body Dressing: with supervision;sit to/from stand (with gathering clothing items) Pt Will Perform Tub/Shower  Transfer: Tub transfer;with supervision;ambulating;shower seat;rolling walker Additional ADL Goal #1: Pt will independently verbally recall 3 energy conservation strategies.  OT Frequency: Min 2X/week   Barriers to D/C: Decreased caregiver support  pt lives alone       Co-evaluation              End of Session Equipment Utilized During Treatment: Rolling walker;Gait belt;Oxygen Nurse Communication: Mobility status;Other (comment) (SpO2, pt able to void in urinal)  Activity Tolerance: Patient tolerated treatment well Patient left: in chair;with call bell/phone within reach;with family/visitor present   Time: 1453-1511 OT Time Calculation (min): 18 min Charges:  OT General Charges $OT Visit: 1 Procedure OT Evaluation $OT Eval Moderate Complexity: 1 Procedure G-Codes:     Binnie Kand M.S., OTR/L Pager: [phone]  03/03/2016, 3:44 PM

## 2016-03-03 NOTE — Progress Notes (Addendum)
Patient ID: Scott Ward, male   DOB: 02-Jul-1940, 76 y.o.   MRN: [id number]  PROGRESS NOTE    Scott Ward  [id number] DOB: 04-08-1940 DOA: 03/02/2016  PCP: Odette Fraction, MD   Brief Narrative:   76 y.o. male with a past medical history of hypertension, diabetes, primarily diet controlled, tobacco abuse. Patient presented to [facility] 03/02/2016 with worsening shortness of breath, wheezing and cough productive of greenish sputum for past week prior to this admission. Patient also reported subjective fevers. No sick contacts at home. Patient also reported generalized weakness and fatigue.  On admission, patient was hemodynamically stable. His oxygen saturation was 88% on room air but has improved to 94% with nasal cannula oxygen support. His chest x-ray showed right middle lobe pneumonia. He was started on azithromycin and Rocephin and admitted for management of COPD exacerbation and pneumonia.   Assessment & Plan:   Acute respiratory failure with hypoxia / acute exacerbation of COPD - Hypoxia likely secondary to combination of COPD and pneumonia - Continue current nebulizer treatments, albuterol and Atrovent every 4 hours scheduled and every 2 hours as needed for shortness of breath or wheezing - Continue Solu-Medrol 60 mg IV every 6 hours - Continue oxygen support via nasal cannula to keep oxygen saturation above 90%  Right middle lobe pneumonia / leukocytosis - Chest x-ray concerning for right middle lobe pneumonia - Patient started on empiric Rocephin and azithromycin - Respiratory culture pending  - HIV, legionella and strep pneumonia pending   Essential hypertension - Continue lisinopril   Tobacco abuse  - Continue nicotine patch  - Counseled on smoking cessation   Steroid induced hypoglycemia - CBG 193 - Obtain A1c - Added novolog 3 Units TIDAC - Continue sliding scale insulin    DVT prophylaxis: Lovenox subcutaneous Code Status: full code  Family  Communication: No family at the bedside Disposition Plan: Anticipate discharge by 03/05/2016   Consultants:   PT  Procedures:   None   Antimicrobials:   Azithromycin and Rocephin 03/03/2015 -->   Subjective: No overnight events.  Objective: Vitals:   03/03/16 0419 03/03/16 0511 03/03/16 0735 03/03/16 1151  BP:  (!) 143/67    Pulse:  73    Resp:      Temp:  98 F (36.7 C)    TempSrc:  Oral    SpO2: 94% 93% 93% 93%  Weight:      Height:        Intake/Output Summary (Last 24 hours) at 03/03/16 1232 Last data filed at 03/03/16 0932  Gross per 24 hour  Intake              610 ml  Output                0 ml  Net              610 ml   Filed Weights   03/02/16 1202 03/02/16 2006  Weight: 107.5 kg (237 lb) 103 kg (227 lb 1.6 oz)    Examination:  General exam: Appears calm and comfortable  Respiratory system: Wheezing and rhonchi on the right side Cardiovascular system: S1 & S2 heard, RRR. No pedal edema. Gastrointestinal system: Abdomen is nondistended, soft and nontender. No organomegaly or masses felt. Normal bowel sounds heard. Central nervous system: Alert and oriented. No focal neurological deficits. Extremities: Symmetric 5 x 5 power. Skin: No rashes, lesions or ulcers Psychiatry: Judgement and insight appear normal. Mood & affect appropriate.   Data  Reviewed: I have personally reviewed following labs and imaging studies  CBC:  Recent Labs Lab 03/02/16 1227 03/03/16 0223  WBC 14.4* 14.6*  HGB 15.3 14.2  HCT 44.0 41.5  MCV 93.0 92.6  PLT 184 [id number]   Basic Metabolic Panel:  Recent Labs Lab 03/02/16 1227 03/03/16 0223  NA 139 141  K 3.8 3.9  CL 104 105  CO2 24 27  GLUCOSE 135* 240*  BUN 25* 26*  CREATININE 0.98 1.02  CALCIUM 8.8* 8.9   GFR: Estimated Creatinine Clearance: 72.9 mL/min (by C-G formula based on SCr of 1.02 mg/dL). Liver Function Tests:  Recent Labs Lab 03/03/16 0223  AST 52*  ALT 42  ALKPHOS 77  BILITOT 0.4  PROT  6.1*  ALBUMIN 2.7*   No results for input(s): LIPASE, AMYLASE in the last 168 hours. No results for input(s): AMMONIA in the last 168 hours. Coagulation Profile:  Recent Labs Lab 03/02/16 1227  INR 1.12   Cardiac Enzymes: No results for input(s): CKTOTAL, CKMB, CKMBINDEX, TROPONINI in the last 168 hours. BNP (last 3 results) No results for input(s): PROBNP in the last 8760 hours. HbA1C: No results for input(s): HGBA1C in the last 72 hours. CBG:  Recent Labs Lab 03/02/16 2333 03/03/16 0639  GLUCAP 287* 193*   Lipid Profile: No results for input(s): CHOL, HDL, LDLCALC, TRIG, CHOLHDL, LDLDIRECT in the last 72 hours. Thyroid Function Tests:  Recent Labs  03/03/16 0223  TSH 0.180*   Anemia Panel: No results for input(s): VITAMINB12, FOLATE, FERRITIN, TIBC, IRON, RETICCTPCT in the last 72 hours. Urine analysis: No results found for: COLORURINE, APPEARANCEUR, LABSPEC, PHURINE, GLUCOSEU, HGBUR, BILIRUBINUR, KETONESUR, PROTEINUR, UROBILINOGEN, NITRITE, LEUKOCYTESUR Sepsis Labs: @LABRCNTIP (procalcitonin:4,lacticidven:4)   )No results found for this or any previous visit (from the past 240 hour(s)).    Radiology Studies: Dg Chest 2 View Result Date: 03/02/2016 RIGHT middle lobe pneumonia. Electronically Signed   By: Staci Righter M.D.   On: 03/02/2016 14:46     Scheduled Meds: . albuterol  2.5 mg Nebulization Q4H  . aspirin EC  81 mg Oral Daily  . azithromycin  500 mg Intravenous Q24H  . cefTRIAXone (ROCEPHIN)  IV  1 g Intravenous Q24H  . enoxaparin (LOVENOX) injection  40 mg Subcutaneous Q24H  . insulin aspart  0-9 Units Subcutaneous TID WC  . ipratropium  0.5 mg Nebulization Q4H  . lisinopril  20 mg Oral Daily  . methylPREDNISolone (SOLU-MEDROL) injection  60 mg Intravenous Q6H  . multivitamin with minerals  1 tablet Oral Daily  . nicotine  14 mg Transdermal Daily  . polyethylene glycol  17 g Oral Daily  . senna  1 tablet Oral BID  . silver sulfADIAZINE    Topical BID  . simvastatin  10 mg Oral QHS  . sodium chloride flush  3 mL Intravenous Q12H  . verapamil  120 mg Oral QHS   Continuous Infusions:   LOS: 1 day    Time spent: 25 minutes  Greater than 50% of the time spent on counseling and coordinating the care.   Leisa Lenz, MD [facility] Pager [phone]  If 7PM-7AM, please contact night-coverage [email or web address] Password [facility] 03/03/2016, 12:32 PM

## 2016-03-03 NOTE — Care Management Note (Signed)
Case Management Note Marvetta Gibbons RN, BSN Unit 2W-Case Manager [phone]  Patient Details  Name: Scott VONGUNTEN MRN: [id number] Date of Birth: 03-12-1940  Subjective/Objective:   Pt admitted with CAP                 Action/Plan: PTA pt lived at home alone, per PT eval recommendations for HHPT, may need home 02- will monitor and see if pt able to wean off prior to d/c. CM to follow for d/c needs.   Expected Discharge Date:                  Expected Discharge Plan:  [place]  In-House Referral:     Discharge planning Services  CM Consult  Post Acute Care Choice:  Home Health Choice offered to:     DME Arranged:    DME Agency:     HH Arranged:    [place] Agency:     Status of Service:  In process, will continue to follow  If discussed at Long Length of Stay Meetings, dates discussed:    Additional Comments:  [name], RN 03/03/2016, 11:12 AM

## 2016-03-03 NOTE — Evaluation (Signed)
Physical Therapy Evaluation Patient Details Name: Scott Ward MRN: [id number] DOB: [birth year] Today's Date: 03/03/2016   History of Present Illness  LEVERNE Ward is a 76 y.o. male with a past medical history of hypertension, diabetes, which is primarily diet controlled, tobacco abuse, no known lung disease, who was in his usual state of health till about a week ago when he started developing a cough. This was followed by an episode of weakness. In ED he was tachypneic with O2 sat 88% and chest x-ray suggestive of PNA.   Clinical Impression  Pt admitted with above diagnosis. Pt currently with functional limitations due to the deficits listed below (see PT Problem List). Pt currently on 4L O2 and desat to 88% with ambulation at that level so unable to begin O2 wean. Suspect he may need to go home on O2. Pt fatigues quickly with gait, ambulated 10' with RW before needing to return to room.  Pt will benefit from skilled PT to increase their independence and safety with mobility to allow discharge to the venue listed below.       Follow Up Recommendations Home health PT;Supervision - Intermittent    Equipment Recommendations  Rolling walker with 5" wheels    Recommendations for Other Services       Precautions / Restrictions Precautions Precautions: Fall Precaution Comments: watch O2 sats Restrictions Weight Bearing Restrictions: No      Mobility  Bed Mobility Overal bed mobility: Modified Independent             General bed mobility comments: increased time  Transfers Overall transfer level: Needs assistance Equipment used: Rolling walker (2 wheeled) Transfers: Sit to/from Stand Sit to Stand: Supervision         General transfer comment: vc's for hand placement, supervision for safety. Pt with tremor noted throughout movement, improved as he was up longer.  Ambulation/Gait Ambulation/Gait assistance: Min assist Ambulation Distance (Feet): 100 Feet Assistive device:  Rolling walker (2 wheeled) Gait Pattern/deviations: Step-through pattern;Decreased stride length Gait velocity: decreased Gait velocity interpretation: Below normal speed for age/gender General Gait Details: pt steady with RW, he did not use on PTA but did not feel steady enough to try walking without. HR 115 bpm during ambulation. O2 sats remained 88% on 4L O2 so could not attempt ambulation without O2. After 73', pt very fatigued 2/4 DOE with wheezing, and needed to return to room  Stairs            Wheelchair Mobility    Modified Rankin (Stroke Patients Only)       Balance Overall balance assessment: Needs assistance Sitting-balance support: No upper extremity supported Sitting balance-Leahy Scale: Good     Standing balance support: Single extremity supported Standing balance-Leahy Scale: Fair                               Pertinent Vitals/Pain      Home Living Family/patient expects to be discharged to:: Private residence Living Arrangements: Alone Available Help at Discharge: Family;Available PRN/intermittently Type of Home: House Home Access: Stairs to enter   Entrance Stairs-Number of Steps: 2 Home Layout: One level Home Equipment: None Additional Comments: family lives about 10 mi away, appears to be involved, checks on him almost daily    Prior Function Level of Independence: Independent         Comments: pt drives, cooks, Chief Executive Officer  Extremity/Trunk Assessment   Upper Extremity Assessment Upper Extremity Assessment: Generalized weakness    Lower Extremity Assessment Lower Extremity Assessment: Generalized weakness    Cervical / Trunk Assessment Cervical / Trunk Assessment: Kyphotic  Communication   Communication: No difficulties  Cognition Arousal/Alertness: Awake/alert Behavior During Therapy: WFL for tasks assessed/performed Overall Cognitive Status: Within Functional Limits for tasks assessed                       General Comments General comments (skin integrity, edema, etc.): pt was not on O2 PTA but his son reports that he suspected that he had been having breathing difficulties past few months.     Exercises     Assessment/Plan    PT Assessment Patient needs continued PT services  PT Problem List Decreased strength;Decreased activity tolerance;Decreased balance;Decreased mobility;Decreased knowledge of use of DME;Decreased knowledge of precautions;Cardiopulmonary status limiting activity          PT Treatment Interventions DME instruction;Gait training;Stair training;Functional mobility training;Therapeutic activities;Therapeutic exercise;Balance training;Patient/family education    PT Goals (Current goals can be found in the Care Plan section)  Acute Rehab PT Goals Patient Stated Goal: return home PT Goal Formulation: With patient Time For Goal Achievement: 03/17/16 Potential to Achieve Goals: Good    Frequency Min 3X/week   Barriers to discharge Decreased caregiver support lives alone but son reports they are working on having someone with him, in part to keep him from smoking    Co-evaluation               End of Session Equipment Utilized During Treatment: Gait belt;Oxygen Activity Tolerance: Patient tolerated treatment well Patient left: in chair;with call bell/phone within reach;with family/visitor present Nurse Communication: Mobility status         Time: [id number] PT Time Calculation (min) (ACUTE ONLY): 46 min   Charges:   PT Evaluation $PT Eval Moderate Complexity: 1 Procedure PT Treatments $Gait Training: 8-22 mins $Therapeutic Activity: 8-22 mins   PT G Codes:      Leighton Roach, PT  Acute Rehab Services  [place] 03/03/2016, 10:45 AM

## 2016-03-04 LAB — CBC
HCT: 38.8 % — ABNORMAL LOW (ref 39.0–52.0)
Hemoglobin: 13.8 g/dL (ref 13.0–17.0)
MCH: 32.2 pg (ref 26.0–34.0)
MCHC: 35.6 g/dL (ref 30.0–36.0)
MCV: 90.4 fL (ref 78.0–100.0)
PLATELETS: 215 10*3/uL (ref 150–400)
RBC: 4.29 MIL/uL (ref 4.22–5.81)
RDW: 13.6 % (ref 11.5–15.5)
WBC: 25.9 10*3/uL — ABNORMAL HIGH (ref 4.0–10.5)

## 2016-03-04 LAB — GLUCOSE, CAPILLARY
GLUCOSE-CAPILLARY: 191 mg/dL — AB (ref 65–99)
Glucose-Capillary: 194 mg/dL — ABNORMAL HIGH (ref 65–99)
Glucose-Capillary: 165 mg/dL — ABNORMAL HIGH (ref 65–99)
Glucose-Capillary: 239 mg/dL — ABNORMAL HIGH (ref 65–99)

## 2016-03-04 LAB — EXPECTORATED SPUTUM ASSESSMENT W GRAM STAIN, RFLX TO RESP C

## 2016-03-04 LAB — BASIC METABOLIC PANEL
Anion gap: 7 (ref 5–15)
BUN: 34 mg/dL — AB (ref 6–20)
CHLORIDE: 106 mmol/L (ref 101–111)
CO2: 28 mmol/L (ref 22–32)
CREATININE: 0.98 mg/dL (ref 0.61–1.24)
Calcium: 8.8 mg/dL — ABNORMAL LOW (ref 8.9–10.3)
GFR calc Af Amer: >60 mL/min (ref >60)
GFR calc non Af Amer: >60 mL/min (ref >60)
Glucose, Bld: 153 mg/dL — ABNORMAL HIGH (ref 65–99)
Potassium: 3.9 mmol/L (ref 3.5–5.1)
SODIUM: 141 mmol/L (ref 135–145)

## 2016-03-04 LAB — HEMOGLOBIN A1C
Hgb A1c MFr Bld: 6.9 % — ABNORMAL HIGH (ref 4.8–5.6)
Mean Plasma Glucose: 151 mg/dL

## 2016-03-04 LAB — STREP PNEUMONIAE URINARY ANTIGEN: STREP PNEUMO URINARY ANTIGEN: NEGATIVE

## 2016-03-04 LAB — EXPECTORATED SPUTUM ASSESSMENT W REFEX TO RESP CULTURE

## 2016-03-04 MED ORDER — METHYLPREDNISOLONE SODIUM SUCC 125 MG IJ SOLR
60.0000 mg | INTRAMUSCULAR | Status: DC
  Administered 2016-03-05: 60 mg via INTRAVENOUS
  Filled 2016-03-04: qty 2

## 2016-03-04 MED ORDER — IPRATROPIUM BROMIDE 0.02 % IN SOLN
0.5000 mg | Freq: Three times a day (TID) | RESPIRATORY_TRACT | Status: DC
  Administered 2016-03-04 – 2016-03-05 (×2): 0.5 mg via RESPIRATORY_TRACT
  Filled 2016-03-04 (×2): qty 2.5

## 2016-03-04 MED ORDER — ALBUTEROL SULFATE (2.5 MG/3ML) 0.083% IN NEBU
2.5000 mg | INHALATION_SOLUTION | Freq: Three times a day (TID) | RESPIRATORY_TRACT | Status: DC
  Administered 2016-03-04 – 2016-03-05 (×2): 2.5 mg via RESPIRATORY_TRACT
  Filled 2016-03-04 (×2): qty 3

## 2016-03-04 NOTE — Progress Notes (Signed)
Physical Therapy Treatment Patient Details Name: Scott Ward MRN: [id number] DOB: 11-Jan-1941 Today's Date: 03/04/2016    History of Present Illness Scott Ward is a 76 y.o. male with a past medical history of hypertension, diabetes, which is primarily diet controlled, tobacco abuse, no known lung disease, who was in his usual state of health till about a week ago when he started developing a cough. This was followed by an episode of weakness. In ED he was tachypneic with O2 sat 88% and chest x-ray suggestive of PNA.     PT Comments    Pt de-sat on 3 L/min with gait to 87%.  With 4L/min de-sat to 88%, but improved with pursed lip breathing.  Pt ambulated pushing o2 tank, but feel RW is best option.  Con't to recommend HHPT.  Follow Up Recommendations  Home health PT;Supervision - Intermittent     Equipment Recommendations  Rolling walker with 5" wheels (son reports they already got)    Recommendations for Other Services       Precautions / Restrictions Precautions Precautions: Fall Precaution Comments: watch O2 sats Restrictions Weight Bearing Restrictions: No    Mobility  Bed Mobility               General bed mobility comments: Pt OOB in chair upon arrival  Transfers Overall transfer level: Needs assistance Equipment used: None Transfers: Sit to/from Stand Sit to Stand: Min guard         General transfer comment: min/guard for safety and steadying upon standing, but no physical assistance required  Ambulation/Gait Ambulation/Gait assistance: Min guard Ambulation Distance (Feet): 200 Feet (with 2 standing rest breaks) Assistive device:  (pushed o2 tank) Gait Pattern/deviations: Step-through pattern Gait velocity: decreased   General Gait Details: Pt wanted to try ambulating without RW, but wanted nearby, so had him push o2 tank for some support.  Amb on 3 L/min and o2 dropped to 87%, pursed lip breathing and increased only to 88%. increased to 4L/min and  after another bout of ambulation checked and 88%, but improved to 90% with pursed lip breathing.  Pt legs began feeling shaky at end of gait and pt reporting fatigue.   Stairs            Wheelchair Mobility    Modified Rankin (Stroke Patients Only)       Balance     Sitting balance-Leahy Scale: Good     Standing balance support: Single extremity supported Standing balance-Leahy Scale: Poor Standing balance comment: required UE support                    Cognition Arousal/Alertness: Awake/alert Behavior During Therapy: WFL for tasks assessed/performed Overall Cognitive Status: Within Functional Limits for tasks assessed                      Exercises      General Comments        Pertinent Vitals/Pain Pain Assessment: No/denies pain    Home Living                      Prior Function            PT Goals (current goals can now be found in the care plan section) Acute Rehab PT Goals Patient Stated Goal: return home PT Goal Formulation: With patient Time For Goal Achievement: 03/17/16 Potential to Achieve Goals: Good Progress towards PT goals: Progressing toward goals    Frequency  Min 3X/week      PT Plan Current plan remains appropriate    Co-evaluation             End of Session Equipment Utilized During Treatment: Gait belt;Oxygen Activity Tolerance: Patient tolerated treatment well Patient left: in chair;with call bell/phone within reach;with family/visitor present;Other (comment) (with respiratory therapy)     Time: [id number] PT Time Calculation (min) (ACUTE ONLY): 19 min  Charges:  $Gait Training: 8-22 mins                    G Codes:      Scott Ward 03/04/2016, 12:28 PM

## 2016-03-04 NOTE — Progress Notes (Signed)
Patient ID: Scott Ward, male   DOB: October 15, 1940, 76 y.o.   MRN: [id number]  PROGRESS NOTE    Scott Ward  [id number] DOB: 11-29-1940 DOA: 03/02/2016  PCP: Odette Fraction, MD   Brief Narrative:   76 y.o. male with a past medical history of hypertension, diabetes, primarily diet controlled, tobacco abuse. Patient presented to [facility] 03/02/2016 with worsening shortness of breath, wheezing and cough productive of greenish sputum for past week prior to this admission. Patient also reported subjective fevers. No sick contacts at home. Patient also reported generalized weakness and fatigue.  On admission, patient was hemodynamically stable. His oxygen saturation was 88% on room air but has improved to 94% with nasal cannula oxygen support. His chest x-ray showed right middle lobe pneumonia. He was started on azithromycin and Rocephin and admitted for management of COPD exacerbation and pneumonia.   Assessment & Plan:   Acute respiratory failure with hypoxia / acute exacerbation of COPD - Hypoxia likely secondary to combination of COPD and pneumonia - Continue current nebulizer treatments, albuterol and Atrovent every 4 hours scheduled and every 2 hours as needed for shortness of breath or wheezing - Decrease Solu-Medrol 60 mg IV from every 6 hours scheduled to every 24 hours - No rhonchi or wheezing on exam this am but pt is still little short of breath  - Continue oxygen support via nasal cannula to keep oxygen saturation above 90%  Right middle lobe pneumonia / leukocytosis - Chest x-ray concerning for right middle lobe pneumonia - Continue Rocephin and azithromycin - Respiratory culture pending as of 03/05/2015 - HIV and strep pneumonia negative - Legionella pending  - WBC count up likely from steroids   Essential hypertension - Continue lisinopril   Tobacco abuse  - Continue nicotine patch  - Counseled on smoking cessation   Steroid induced hypoglycemia - A1c 6.9 on this  admission - Added novolog 3 Units TIDAC - CBG's in past 24 hours: 283, 189, 191 - We are reducing steroids dose so hopefully CBG's will improve with that change  - Continue sliding scale insulin  Dyslipidemia - Continue simvastatin 10 mg at bedtime     DVT prophylaxis: Lovenox subcutaneous Code Status: full code  Family Communication: No family at the bedside Disposition Plan: Anticipate discharge by 03/05/2016   Consultants:   PT  Procedures:   None   Antimicrobials:   Azithromycin and Rocephin 03/03/2015 -->   Subjective: No overnight events.  Objective: Vitals:   03/03/16 1545 03/03/16 2137 03/03/16 2152 03/04/16 0417  BP:  (!) 123/53  (!) 164/76  Pulse:  81  82  Resp:  20  20  Temp:  98.4 F (36.9 C)  97.9 F (36.6 C)  TempSrc:  Oral  Oral  SpO2: 92% 94% 96% 90%  Weight:    105.2 kg (231 lb 14.4 oz)  Height:        Intake/Output Summary (Last 24 hours) at 03/04/16 0956 Last data filed at 03/04/16 0541  Gross per 24 hour  Intake              480 ml  Output              860 ml  Net             -380 ml   Filed Weights   03/02/16 1202 03/02/16 2006 03/04/16 0417  Weight: 107.5 kg (237 lb) 103 kg (227 lb 1.6 oz) 105.2 kg (231 lb 14.4 oz)  Examination:  General exam: Appears calm and comfortable  Respiratory system: Wheezing and rhonchi on the right side Cardiovascular system: S1 & S2 heard, RRR. No pedal edema. Gastrointestinal system: Abdomen is nondistended, soft and nontender. No organomegaly or masses felt. Normal bowel sounds heard. Central nervous system: Alert and oriented. No focal neurological deficits. Extremities: Symmetric 5 x 5 power. Skin: No rashes, lesions or ulcers Psychiatry: Judgement and insight appear normal. Mood & affect appropriate.   Data Reviewed: I have personally reviewed following labs and imaging studies  CBC:  Recent Labs Lab 03/02/16 1227 03/03/16 0223 03/04/16 0154  WBC 14.4* 14.6* 25.9*  HGB 15.3 14.2  13.8  HCT 44.0 41.5 38.8*  MCV 93.0 92.6 90.4  PLT 184 192 [id number]   Basic Metabolic Panel:  Recent Labs Lab 03/02/16 1227 03/03/16 0223 03/04/16 0154  NA 139 141 141  K 3.8 3.9 3.9  CL 104 105 106  CO2 24 27 28   GLUCOSE 135* 240* 153*  BUN 25* 26* 34*  CREATININE 0.98 1.02 0.98  CALCIUM 8.8* 8.9 8.8*   GFR: Estimated Creatinine Clearance: 76.6 mL/min (by C-G formula based on SCr of 0.98 mg/dL). Liver Function Tests:  Recent Labs Lab 03/03/16 0223  AST 52*  ALT 42  ALKPHOS 77  BILITOT 0.4  PROT 6.1*  ALBUMIN 2.7*   No results for input(s): LIPASE, AMYLASE in the last 168 hours. No results for input(s): AMMONIA in the last 168 hours. Coagulation Profile:  Recent Labs Lab 03/02/16 1227  INR 1.12   Cardiac Enzymes: No results for input(s): CKTOTAL, CKMB, CKMBINDEX, TROPONINI in the last 168 hours. BNP (last 3 results) No results for input(s): PROBNP in the last 8760 hours. HbA1C:  Recent Labs  03/03/16 0223  HGBA1C 6.9*   CBG:  Recent Labs Lab 03/02/16 2333 03/03/16 0639 03/03/16 1743 03/03/16 2129 03/04/16 0618  GLUCAP 287* 193* 283* 189* 191*   Lipid Profile: No results for input(s): CHOL, HDL, LDLCALC, TRIG, CHOLHDL, LDLDIRECT in the last 72 hours. Thyroid Function Tests:  Recent Labs  03/03/16 0223  TSH 0.180*   Anemia Panel: No results for input(s): VITAMINB12, FOLATE, FERRITIN, TIBC, IRON, RETICCTPCT in the last 72 hours. Urine analysis: No results found for: COLORURINE, APPEARANCEUR, LABSPEC, PHURINE, GLUCOSEU, HGBUR, BILIRUBINUR, KETONESUR, PROTEINUR, UROBILINOGEN, NITRITE, LEUKOCYTESUR Sepsis Labs: @LABRCNTIP (procalcitonin:4,lacticidven:4)   )No results found for this or any previous visit (from the past 240 hour(s)).    Radiology Studies: Dg Chest 2 View Result Date: 03/02/2016 RIGHT middle lobe pneumonia. Electronically Signed   By: Staci Righter M.D.   On: 03/02/2016 14:46     Scheduled Meds: . albuterol  2.5 mg  Nebulization Q4H  . aspirin EC  81 mg Oral Daily  . azithromycin  500 mg Intravenous Q24H  . cefTRIAXone (ROCEPHIN)  IV  1 g Intravenous Q24H  . enoxaparin (LOVENOX) injection  40 mg Subcutaneous Q24H  . insulin aspart  0-9 Units Subcutaneous TID WC  . insulin aspart  3 Units Subcutaneous TID WC  . ipratropium  0.5 mg Nebulization Q4H  . lisinopril  20 mg Oral Daily  . [START ON 03/05/2016] methylPREDNISolone (SOLU-MEDROL) injection  60 mg Intravenous Q24H  . multivitamin with minerals  1 tablet Oral Daily  . nicotine  14 mg Transdermal Daily  . polyethylene glycol  17 g Oral Daily  . senna  1 tablet Oral BID  . silver sulfADIAZINE   Topical BID  . simvastatin  10 mg Oral QHS  . sodium chloride flush  3 mL Intravenous Q12H  . verapamil  120 mg Oral QHS   Continuous Infusions:   LOS: 2 days    Time spent: 25 minutes  Greater than 50% of the time spent on counseling and coordinating the care.   Leisa Lenz, MD [facility] Pager [phone]  If 7PM-7AM, please contact night-coverage [email or web address] Password [email or web address] 03/04/2016, 9:56 AM

## 2016-03-05 LAB — CBC
HEMATOCRIT: 41 % (ref 39.0–52.0)
Hemoglobin: 14.2 g/dL (ref 13.0–17.0)
MCH: 31.6 pg (ref 26.0–34.0)
MCHC: 34.6 g/dL (ref 30.0–36.0)
MCV: 91.3 fL (ref 78.0–100.0)
Platelets: 235 10*3/uL (ref 150–400)
RBC: 4.49 MIL/uL (ref 4.22–5.81)
RDW: 13.8 % (ref 11.5–15.5)
WBC: 28.4 10*3/uL — AB (ref 4.0–10.5)

## 2016-03-05 LAB — GLUCOSE, CAPILLARY
GLUCOSE-CAPILLARY: 155 mg/dL — AB (ref 65–99)
GLUCOSE-CAPILLARY: 177 mg/dL — AB (ref 65–99)
Glucose-Capillary: 154 mg/dL — ABNORMAL HIGH (ref 65–99)
Glucose-Capillary: 130 mg/dL — ABNORMAL HIGH (ref 65–99)

## 2016-03-05 LAB — LEGIONELLA PNEUMOPHILA SEROGP 1 UR AG: L. pneumophila Serogp 1 Ur Ag: NEGATIVE

## 2016-03-05 LAB — BASIC METABOLIC PANEL
ANION GAP: 8 (ref 5–15)
BUN: 37 mg/dL — ABNORMAL HIGH (ref 6–20)
CO2: 29 mmol/L (ref 22–32)
Calcium: 8.7 mg/dL — ABNORMAL LOW (ref 8.9–10.3)
Chloride: 105 mmol/L (ref 101–111)
Creatinine, Ser: 0.95 mg/dL (ref 0.61–1.24)
GFR calc Af Amer: >60 mL/min (ref >60)
GLUCOSE: 135 mg/dL — AB (ref 65–99)
POTASSIUM: 4.8 mmol/L (ref 3.5–5.1)
Sodium: 142 mmol/L (ref 135–145)

## 2016-03-05 MED ORDER — PREDNISONE 20 MG PO TABS
50.0000 mg | ORAL_TABLET | Freq: Every day | ORAL | Status: DC
  Administered 2016-03-06 – 2016-03-07 (×2): 50 mg via ORAL
  Filled 2016-03-05 (×3): qty 2

## 2016-03-05 NOTE — Progress Notes (Signed)
Patient ID: Scott Ward, male   DOB: Jan 11, 1941, 76 y.o.   MRN: [id number]  PROGRESS NOTE    Scott Ward  [id number] DOB: [birth year] DOA: 03/02/2016  PCP: Odette Fraction, MD   Brief Narrative:   76 y.o. male with a past medical history of hypertension, diabetes, primarily diet controlled, tobacco abuse. Patient presented to [facility] 03/02/2016 with worsening shortness of breath, wheezing and cough productive of greenish sputum for past week prior to this admission. Patient also reported subjective fevers. No sick contacts at home. Patient also reported generalized weakness and fatigue.  On admission, patient was hemodynamically stable. His oxygen saturation was 88% on room air but has improved to 94% with nasal cannula oxygen support. His chest x-ray showed right middle lobe pneumonia. He was started on azithromycin and Rocephin and admitted for management of COPD exacerbation and pneumonia.   Assessment & Plan:   Acute respiratory failure with hypoxia / acute exacerbation of COPD - Hypoxia likely secondary to combination of COPD and pneumonia - Continue Albuterol and Atrovent every 4 hours scheduled and every 2 hours as needed for shortness of breath or wheezing - Stop Solu-Medrol and use prednisone 50 mg a day starting tomorrow, order already placed - Continue oxygen support via nasal cannula to keep oxygen saturation above 90% - Instructed to use incentive spirometry every hour while awake  - Ambulate in hallway daily without oxygen to assess oxygen needs  Right middle lobe pneumonia / leukocytosis - Chest x-ray concerning for right middle lobe pneumonia - We will continue azithromycin and Rocephin - Respiratory culture pending - HIV and strep pneumonia negative - Legionella pending  - Follow-up CBC tomorrow morning  Essential hypertension - Continue lisinopril   Tobacco abuse  - Continue nicotine patch  - Counseled on smoking cessation   Steroid induced  hypoglycemia - A1c 6.9 on this admission - Continue NovoLog 3 units 3 times a day before meals and sliding scale insulin - CBG's in past 24 hours: 239, 154, 130  Dyslipidemia - Continue simvastatin 10 mg at bedtime     DVT prophylaxis: Lovenox subcutaneous Code Status: full code  Family Communication: Patient's son and grandson at the bedside Disposition Plan: Anticipate discharge by 03/08/2016   Consultants:   PT  Procedures:   None   Antimicrobials:   Azithromycin and Rocephin 03/03/2015 -->   Subjective: No overnight events.  Objective: Vitals:   03/05/16 0633 03/05/16 0850 03/05/16 0920 03/05/16 1056  BP: (!) 157/73   (!) 150/88  Pulse: 77     Resp: 16     Temp: 97.6 F (36.4 C)     TempSrc: Oral     SpO2: 95% 92% 91%   Weight:      Height:        Intake/Output Summary (Last 24 hours) at 03/05/16 1147 Last data filed at 03/05/16 0634  Gross per 24 hour  Intake              720 ml  Output              530 ml  Net              190 ml   Filed Weights   03/02/16 2006 03/04/16 0417 03/05/16 0320  Weight: 103 kg (227 lb 1.6 oz) 105.2 kg (231 lb 14.4 oz) 104.9 kg (231 lb 4.8 oz)    Examination:  General exam: No distress but appears ill  Respiratory system: Diminished breath sounds but  no wheezing  Cardiovascular system: S1 & S2 heard, rate controlled  Gastrointestinal system: appreciate bowel sounds, non tender abdomen  Central nervous system: No focal neurological deficits. Extremities: No swelling, palpable pulses  Skin: No rashes, lesions or ulcers, skin is warm and dry  Psychiatry: Normal mood and behavior   Data Reviewed: I have personally reviewed following labs and imaging studies  CBC:  Recent Labs Lab 03/02/16 1227 03/03/16 0223 03/04/16 0154 03/05/16 0354  WBC 14.4* 14.6* 25.9* 28.4*  HGB 15.3 14.2 13.8 14.2  HCT 44.0 41.5 38.8* 41.0  MCV 93.0 92.6 90.4 91.3  PLT 184 192 215 [id number]   Basic Metabolic Panel:  Recent Labs Lab  03/02/16 1227 03/03/16 0223 03/04/16 0154 03/05/16 0354  NA 139 141 141 142  K 3.8 3.9 3.9 4.8  CL 104 105 106 105  CO2 24 27 28 29   GLUCOSE 135* 240* 153* 135*  BUN 25* 26* 34* 37*  CREATININE 0.98 1.02 0.98 0.95  CALCIUM 8.8* 8.9 8.8* 8.7*   GFR: Estimated Creatinine Clearance: 79 mL/min (by C-G formula based on SCr of 0.95 mg/dL). Liver Function Tests:  Recent Labs Lab 03/03/16 0223  AST 52*  ALT 42  ALKPHOS 77  BILITOT 0.4  PROT 6.1*  ALBUMIN 2.7*   No results for input(s): LIPASE, AMYLASE in the last 168 hours. No results for input(s): AMMONIA in the last 168 hours. Coagulation Profile:  Recent Labs Lab 03/02/16 1227  INR 1.12   Cardiac Enzymes: No results for input(s): CKTOTAL, CKMB, CKMBINDEX, TROPONINI in the last 168 hours. BNP (last 3 results) No results for input(s): PROBNP in the last 8760 hours. HbA1C:  Recent Labs  03/03/16 0223  HGBA1C 6.9*   CBG:  Recent Labs Lab 03/04/16 1120 03/04/16 1624 03/04/16 2042 03/05/16 0619 03/05/16 1101  GLUCAP 194* 165* 239* 154* 130*   Lipid Profile: No results for input(s): CHOL, HDL, LDLCALC, TRIG, CHOLHDL, LDLDIRECT in the last 72 hours. Thyroid Function Tests:  Recent Labs  03/03/16 0223  TSH 0.180*   Anemia Panel: No results for input(s): VITAMINB12, FOLATE, FERRITIN, TIBC, IRON, RETICCTPCT in the last 72 hours. Urine analysis: No results found for: COLORURINE, APPEARANCEUR, [place], PHURINE, GLUCOSEU, HGBUR, BILIRUBINUR, KETONESUR, PROTEINUR, UROBILINOGEN, NITRITE, LEUKOCYTESUR Sepsis Labs: @LABRCNTIP (procalcitonin:4,lacticidven:4)   Recent Results (from the past 240 hour(s))  Culture, sputum-assessment     Status: None   Collection Time: 03/03/16  3:44 AM  Result Value Ref Range Status   Specimen Description EXPECTORATED SPUTUM  Final   Special Requests NONE  Final   Sputum evaluation THIS SPECIMEN IS ACCEPTABLE FOR SPUTUM CULTURE  Final   Report Status 03/04/2016 FINAL  Final    Culture, respiratory (NON-Expectorated)     Status: None (Preliminary result)   Collection Time: 03/03/16  3:44 AM  Result Value Ref Range Status   Specimen Description EXPECTORATED SPUTUM  Final   Special Requests NONE Reflexed from [id number]  Final   Gram Stain   Final    ABUNDANT WBC PRESENT,BOTH PMN AND MONONUCLEAR FEW GRAM NEGATIVE RODS RARE GRAM POSITIVE COCCI IN PAIRS FEW GRAM POSITIVE RODS FEW SQUAMOUS EPITHELIAL CELLS PRESENT    Culture TOO YOUNG TO READ  Final   Report Status PENDING  Incomplete      Radiology Studies: Dg Chest 2 View Result Date: 03/02/2016 RIGHT middle lobe pneumonia. Electronically Signed   By: Staci Righter M.D.   On: 03/02/2016 14:46     Scheduled Meds: . albuterol  2.5 mg Nebulization TID  .  aspirin EC  81 mg Oral Daily  . azithromycin  500 mg Intravenous Q24H  . cefTRIAXone (ROCEPHIN)  IV  1 g Intravenous Q24H  . enoxaparin (LOVENOX) injection  40 mg Subcutaneous Q24H  . insulin aspart  0-9 Units Subcutaneous TID WC  . insulin aspart  3 Units Subcutaneous TID WC  . ipratropium  0.5 mg Nebulization TID  . lisinopril  20 mg Oral Daily  . methylPREDNISolone (SOLU-MEDROL) injection  60 mg Intravenous Q24H  . multivitamin with minerals  1 tablet Oral Daily  . nicotine  14 mg Transdermal Daily  . polyethylene glycol  17 g Oral Daily  . senna  1 tablet Oral BID  . silver sulfADIAZINE   Topical BID  . simvastatin  10 mg Oral QHS  . sodium chloride flush  3 mL Intravenous Q12H  . verapamil  120 mg Oral QHS   Continuous Infusions:   LOS: 3 days    Time spent: 25 minutes  Greater than 50% of the time spent on counseling and coordinating the care.   Leisa Lenz, MD [facility] Pager [phone]  If 7PM-7AM, please contact night-coverage [email or web address] Password [email or web address] 03/05/2016, 11:47 AM

## 2016-03-05 NOTE — Progress Notes (Signed)
Physical Therapy Treatment Patient Details Name: Scott Ward MRN: [id number] DOB: [birth year] Today's Date: 03/05/2016    History of Present Illness Scott Ward is a 76 y.o. male with a past medical history of hypertension, diabetes, which is primarily diet controlled, tobacco abuse, no known lung disease, who was in his usual state of health till about a week ago when he started developing a cough. This was followed by an episode of weakness. In ED he was tachypneic with O2 sat 88% and chest x-ray suggestive of PNA.     PT Comments    Initiated stair training today with o2 intact at 4 L/min to keep o2 89-90% range.  He needs frequent cueing for proper breathing techniques.  Educated on seated LE therex he can do to prevent strength loss.  Con't to recommend HHPT.   Follow Up Recommendations  Home health PT;Supervision - Intermittent     Equipment Recommendations  None recommended by PT    Recommendations for Other Services       Precautions / Restrictions Precautions Precautions: Fall Precaution Comments: watch O2 sats Restrictions Weight Bearing Restrictions: No    Mobility  Bed Mobility               General bed mobility comments: Pt OOB in chair upon arrival  Transfers Overall transfer level: Needs assistance Equipment used: None Transfers: Sit to/from Stand Sit to Stand: Supervision         General transfer comment: S for safety  Ambulation/Gait Ambulation/Gait assistance: Min guard Ambulation Distance (Feet): 270 Feet Assistive device: Rolling walker (2 wheeled) Gait Pattern/deviations: Step-through pattern Gait velocity: decreased   General Gait Details: Amb with RW to stairs with o2 on 3 L/min with o2 86%, so increased to 4L/min. O2 increased to 89-90% with max cueing for proper breathing techniques.   Stairs Stairs: Yes   Stair Management: One rail Left;Alternating pattern;Step to pattern;Backwards;Forwards Number of Stairs: 2 (x2 reps  ) General stair comments: Pt did step through pattern 1st rep and then educated on step to pattern and returned demonstrated.  Wheelchair Mobility    Modified Rankin (Stroke Patients Only)       Balance     Sitting balance-Leahy Scale: Good     Standing balance support: Single extremity supported Standing balance-Leahy Scale: Poor Standing balance comment: needs UE support                    Cognition Arousal/Alertness: Awake/alert Behavior During Therapy: WFL for tasks assessed/performed Overall Cognitive Status: Within Functional Limits for tasks assessed                      Exercises      General Comments General comments (skin integrity, edema, etc.): Pt left with o2 back on 2.5 L/min on wall unit with sat 93% while sitting in chair      Pertinent Vitals/Pain Pain Assessment: No/denies pain    Home Living                      Prior Function            PT Goals (current goals can now be found in the care plan section) Acute Rehab PT Goals Patient Stated Goal: return home PT Goal Formulation: With patient Time For Goal Achievement: 03/17/16 Potential to Achieve Goals: Good Progress towards PT goals: Progressing toward goals    Frequency    Min 3X/week  PT Plan Current plan remains appropriate    Co-evaluation             End of Session Equipment Utilized During Treatment: Gait belt;Oxygen Activity Tolerance: Patient tolerated treatment well Patient left: in chair;with call bell/phone within reach;with family/visitor present     Time: [id number] PT Time Calculation (min) (ACUTE ONLY): 20 min  Charges:  $Gait Training: 8-22 mins                    G Codes:      Scott Ward 03/05/2016, 11:02 AM

## 2016-03-05 NOTE — Progress Notes (Signed)
Occupational Therapy Treatment Patient Details Name: Scott Ward MRN: [id number] DOB: 05/20/1940 Today's Date: 03/05/2016    History of present illness Scott Ward is a 76 y.o. male with a past medical history of hypertension, diabetes, which is primarily diet controlled, tobacco abuse, no known lung disease, who was in his usual state of health till about a week ago when he started developing a cough. This was followed by an episode of weakness. In ED he was tachypneic with O2 sat 88% and chest x-ray suggestive of PNA.    OT comments  Pt still with limitations in O2 sats and endurance.  Oxygen decreased to 87% on room air in sitting when donning gripper socks.  Maintained at 92-93% on 2.5 Ls during activity.  Still needs supervision for safety with tub/shower transfers.  Son and grandson present and have purchased a shower seat and are installing grab bars as well.  Will continue to follow for endurance building and ADL independence.    Follow Up Recommendations  Home health OT;Supervision - Intermittent    Equipment Recommendations  None recommended by OT    Recommendations for Other Services      Precautions / Restrictions Precautions Precautions: Fall Precaution Comments: watch O2 sats Restrictions Weight Bearing Restrictions: No       Mobility Bed Mobility               General bed mobility comments: Pt OOB in chair upon arrival  Transfers Overall transfer level: Needs assistance Equipment used: Rolling walker (2 wheeled) Transfers: Sit to/from Stand Sit to Stand: Supervision         General transfer comment: S for safety    Balance Overall balance assessment: Needs assistance Sitting-balance support: Feet supported;No upper extremity supported Sitting balance-Leahy Scale: Good     Standing balance support: Single extremity supported Standing balance-Leahy Scale: Fair Standing balance comment: needs UE support                   ADL Overall  ADL's : Needs assistance/impaired                                 Tub/ Shower Transfer: Tub transfer;Supervision/safety;Shower seat;Rolling walker;Ambulation;Stand-pivot   Functional mobility during ADLs: Rolling walker General ADL Comments: Oxygen sats decreased to 87% on room air when attempting to donn his socks.  Oxygen sats remained at 92% when maintained on 2.5 Ls during activity.  Pt's son and grandson present for session as well.  Discussed placement of grab bars in the shower, which son is going to install.  He already has a shower seat purchased as well.  Educated pt and family on energy conservation strategies and provided handout as well.  Had pt complete short distance mobiity and simulated shower/tub transfers while managing O2 tubing.  Min guard assist for transfer with mod instructional cueing for technique to manage O2 line.                  Cognition   Behavior During Therapy: WFL for tasks assessed/performed Overall Cognitive Status: Within Functional Limits for tasks assessed                                    Pertinent Vitals/ Pain       Pain Assessment: No/denies pain  Frequency  Min 2X/week        Progress Toward Goals  OT Goals(current goals can now be found in the care plan section)  Progress towards OT goals: Progressing toward goals  Acute Rehab OT Goals Patient Stated Goal: return home  Plan Discharge plan remains appropriate       End of Session Equipment Utilized During Treatment: Rolling walker;Oxygen   Activity Tolerance Patient tolerated treatment well   Patient Left in chair;with call bell/phone within reach;with family/visitor present   Nurse Communication Mobility status (Need for dietary consult for managing DM at home. )        Time: [id number] OT Time Calculation (min): 13 min  Charges: OT General Charges $OT Visit: 1 Procedure OT Treatments $Self Care/Home Management : 38-52  mins  Scott Ward OTR/L 03/05/2016, 1:05 PM

## 2016-03-05 NOTE — Care Management Note (Signed)
Case Management Note Marvetta Gibbons RN, BSN Unit 2W-Case Manager [phone]  Patient Details  Name: Scott Ward MRN: [id number] Date of Birth: August 27, 1940  Subjective/Objective:   Pt admitted with CAP                 Action/Plan: PTA pt lived at home alone, per PT eval recommendations for HHPT, may need home 02- will monitor and see if pt able to wean off prior to d/c. CM to follow for d/c needs.   Expected Discharge Date:                  Expected Discharge Plan:  [place]  In-House Referral:     Discharge planning Services  CM Consult  Post Acute Care Choice:  Home Health, Durable Medical Equipment Choice offered to:  Patient, Adult Children  DME Arranged:    DME Agency:     HH Arranged:  RN, PT, OT, Nurse's Aide [place] Agency:     Status of Service:  In process, will continue to follow  If discussed at Long Length of Stay Meetings, dates discussed:    Additional Comments:  03/05/16- 1015- Marvetta Gibbons RN, CM- f/u done with pt's son at the beside regarding d/c needs and [facility]- per son pt will have needed DME at home- still watching 02 sats here to determine if pt will need home 02- son has not yet decided if he wants [facility] services (has the list) CM will f/u with son prior to discharge regarding d/c needs and HH- also provided son with list of private duty agencies per request.   03/15/15- 1600- Marvetta Gibbons RN, CM- attempted to f/u with son at bedside regarding [facility] choice- per pt son has left to go "take care of somethings" informed pt that CM will f/u in am when son returns.   [name], RN 03/05/2016, 10:54 AM

## 2016-03-05 NOTE — Care Management Important Message (Signed)
Important Message  Patient Details  Name: Scott Ward MRN: [id number] Date of Birth: 11-20-1940   Medicare Important Message Given:  Yes    Nathen May 03/05/2016, 1:38 PM

## 2016-03-06 LAB — GLUCOSE, CAPILLARY
GLUCOSE-CAPILLARY: 119 mg/dL — AB (ref 65–99)
Glucose-Capillary: 109 mg/dL — ABNORMAL HIGH (ref 65–99)
Glucose-Capillary: 137 mg/dL — ABNORMAL HIGH (ref 65–99)
Glucose-Capillary: 201 mg/dL — ABNORMAL HIGH (ref 65–99)

## 2016-03-06 LAB — CBC
HEMATOCRIT: 42.6 % (ref 39.0–52.0)
Hemoglobin: 14.5 g/dL (ref 13.0–17.0)
MCH: 31.5 pg (ref 26.0–34.0)
MCHC: 34 g/dL (ref 30.0–36.0)
MCV: 92.4 fL (ref 78.0–100.0)
Platelets: 226 10*3/uL (ref 150–400)
RBC: 4.61 MIL/uL (ref 4.22–5.81)
RDW: 14.2 % (ref 11.5–15.5)
WBC: 20.6 10*3/uL — ABNORMAL HIGH (ref 4.0–10.5)

## 2016-03-06 NOTE — Progress Notes (Addendum)
Patient ID: Scott Ward, male   DOB: 06-29-1940, 76 y.o.   MRN: [id number]  PROGRESS NOTE    Scott Ward  [id number] DOB: [birth year] DOA: 03/02/2016  PCP: Odette Fraction, MD   Brief Narrative:   76 y.o. male with a past medical history of hypertension, diabetes, primarily diet controlled, tobacco abuse. Patient presented to [facility] 03/02/2016 with worsening shortness of breath, wheezing and cough productive of greenish sputum for past week prior to this admission. Patient also reported subjective fevers. No sick contacts at home. Patient also reported generalized weakness and fatigue.  On admission, patient was hemodynamically stable. His oxygen saturation was 88% on room air but has improved to 94% with nasal cannula oxygen support. His chest x-ray showed right middle lobe pneumonia. He was started on azithromycin and Rocephin and admitted for management of COPD exacerbation and pneumonia.   Assessment & Plan:   Acute respiratory failure with hypoxia / acute exacerbation of COPD - Hypoxia likely secondary to combination of COPD and pneumonia - Continue Albuterol and Atrovent every 4 hours scheduled and every 2 hours as needed for shortness of breath or wheezing - Started prednisone 50 mg daily today (stopped solumedrol, last dose 1/5) - Continue oxygen support via nasal cannula to keep oxygen saturation above 90% - Using IS PRN daily   Right middle lobe pneumonia / leukocytosis - Chest x-ray concerning for right middle lobe pneumonia - Continue azithromycin and Rocephin - Respiratory culture pending - reincubated for better growth  - HIV and strep pneumonia negative - Legionella is negative  - WBC count improving   Essential hypertension - Continue lisinopril   Tobacco abuse  - Continue nicotine patch  - Counseled on smoking cessation   Steroid induced hypoglycemia - A1c 6.9 on this admission - Continue NovoLog 3 units 3 times a day before meals and sliding scale  insulin  Dyslipidemia - Continue simvastatin 10 mg at bedtime     DVT prophylaxis: Lovenox subcutaneous Code Status: full code  Family Communication: Patient's son and grandson at the bedside 1/5 Disposition Plan: Anticipate discharge by 03/08/2016   Consultants:   PT  Procedures:   None   Antimicrobials:   Azithromycin and Rocephin 03/03/2015 -->   Subjective: No overnight events.  Objective: Vitals:   03/05/16 1252 03/05/16 1259 03/05/16 1955 03/06/16 0446  BP:  (!) 103/47 (!) 167/79 (!) 154/82  Pulse: 83 80 76 74  Resp:  17 20   Temp:  98.1 F (36.7 C) 98.7 F (37.1 C) 98.4 F (36.9 C)  TempSrc:  Oral Oral Oral  SpO2: 92% 94% 94% 93%  Weight:      Height:        Intake/Output Summary (Last 24 hours) at 03/06/16 1223 Last data filed at 03/06/16 0900  Gross per 24 hour  Intake              480 ml  Output              880 ml  Net             -400 ml   Filed Weights   03/02/16 2006 03/04/16 0417 03/05/16 0320  Weight: 103 kg (227 lb 1.6 oz) 105.2 kg (231 lb 14.4 oz) 104.9 kg (231 lb 4.8 oz)    Examination:  General exam: No distress Respiratory system: Diminished breath sounds but no wheezing  Cardiovascular system: S1 & S2 heard, rate controlled  Gastrointestinal system: (+) BS, non tender  Central  nervous system: Nonfocal Extremities: No swelling, palpable pulses  Skin: warm and dry  Psychiatry: Normal mood and behavior, no agitation, no restlessness   Data Reviewed: I have personally reviewed following labs and imaging studies  CBC:  Recent Labs Lab 03/02/16 1227 03/03/16 0223 03/04/16 0154 03/05/16 0354 03/06/16 0218  WBC 14.4* 14.6* 25.9* 28.4* 20.6*  HGB 15.3 14.2 13.8 14.2 14.5  HCT 44.0 41.5 38.8* 41.0 42.6  MCV 93.0 92.6 90.4 91.3 92.4  PLT 184 192 215 235 [id number]   Basic Metabolic Panel:  Recent Labs Lab 03/02/16 1227 03/03/16 0223 03/04/16 0154 03/05/16 0354  NA 139 141 141 142  K 3.8 3.9 3.9 4.8  CL 104 105 106 105    CO2 24 27 28 29   GLUCOSE 135* 240* 153* 135*  BUN 25* 26* 34* 37*  CREATININE 0.98 1.02 0.98 0.95  CALCIUM 8.8* 8.9 8.8* 8.7*   GFR: Estimated Creatinine Clearance: 79 mL/min (by C-G formula based on SCr of 0.95 mg/dL). Liver Function Tests:  Recent Labs Lab 03/03/16 0223  AST 52*  ALT 42  ALKPHOS 77  BILITOT 0.4  PROT 6.1*  ALBUMIN 2.7*   No results for input(s): LIPASE, AMYLASE in the last 168 hours. No results for input(s): AMMONIA in the last 168 hours. Coagulation Profile:  Recent Labs Lab 03/02/16 1227  INR 1.12   Cardiac Enzymes: No results for input(s): CKTOTAL, CKMB, CKMBINDEX, TROPONINI in the last 168 hours. BNP (last 3 results) No results for input(s): PROBNP in the last 8760 hours. HbA1C: No results for input(s): HGBA1C in the last 72 hours. CBG:  Recent Labs Lab 03/05/16 1101 03/05/16 1731 03/05/16 2110 03/06/16 0604 03/06/16 1120  GLUCAP 130* 155* 177* 109* 137*   Lipid Profile: No results for input(s): CHOL, HDL, LDLCALC, TRIG, CHOLHDL, LDLDIRECT in the last 72 hours. Thyroid Function Tests: No results for input(s): TSH, T4TOTAL, FREET4, T3FREE, THYROIDAB in the last 72 hours. Anemia Panel: No results for input(s): VITAMINB12, FOLATE, FERRITIN, TIBC, IRON, RETICCTPCT in the last 72 hours. Urine analysis: No results found for: COLORURINE, APPEARANCEUR, LABSPEC, PHURINE, GLUCOSEU, HGBUR, BILIRUBINUR, KETONESUR, PROTEINUR, UROBILINOGEN, NITRITE, LEUKOCYTESUR Sepsis Labs: @LABRCNTIP (procalcitonin:4,lacticidven:4)   Recent Results (from the past 240 hour(s))  Culture, sputum-assessment     Status: None   Collection Time: 03/03/16  3:44 AM  Result Value Ref Range Status   Specimen Description EXPECTORATED SPUTUM  Final   Special Requests NONE  Final   Sputum evaluation THIS SPECIMEN IS ACCEPTABLE FOR SPUTUM CULTURE  Final   Report Status 03/04/2016 FINAL  Final  Culture, respiratory (NON-Expectorated)     Status: None (Preliminary result)    Collection Time: 03/03/16  3:44 AM  Result Value Ref Range Status   Specimen Description EXPECTORATED SPUTUM  Final   Special Requests NONE Reflexed from [id number]  Final   Gram Stain   Final    ABUNDANT WBC PRESENT,BOTH PMN AND MONONUCLEAR FEW GRAM NEGATIVE RODS RARE GRAM POSITIVE COCCI IN PAIRS FEW GRAM POSITIVE RODS FEW SQUAMOUS EPITHELIAL CELLS PRESENT    Culture CULTURE REINCUBATED FOR BETTER GROWTH  Final   Report Status PENDING  Incomplete      Radiology Studies: Dg Chest 2 View Result Date: 03/02/2016 RIGHT middle lobe pneumonia. Electronically Signed   By: Staci Righter M.D.   On: 03/02/2016 14:46     Scheduled Meds: . aspirin EC  81 mg Oral Daily  . azithromycin  500 mg Intravenous Q24H  . cefTRIAXone (ROCEPHIN)  IV  1 g Intravenous  Q24H  . enoxaparin (LOVENOX) injection  40 mg Subcutaneous Q24H  . insulin aspart  0-9 Units Subcutaneous TID WC  . insulin aspart  3 Units Subcutaneous TID WC  . lisinopril  20 mg Oral Daily  . multivitamin with minerals  1 tablet Oral Daily  . nicotine  14 mg Transdermal Daily  . polyethylene glycol  17 g Oral Daily  . predniSONE  50 mg Oral Q breakfast  . senna  1 tablet Oral BID  . silver sulfADIAZINE   Topical BID  . simvastatin  10 mg Oral QHS  . sodium chloride flush  3 mL Intravenous Q12H  . verapamil  120 mg Oral QHS   Continuous Infusions:   LOS: 4 days    Time spent: 25 minutes  Greater than 50% of the time spent on counseling and coordinating the care.   Leisa Lenz, MD [facility] Pager [phone]  If 7PM-7AM, please contact night-coverage [email or web address] Password [email or web address] 03/06/2016, 12:23 PM

## 2016-03-07 LAB — CBC
HEMATOCRIT: 46 % (ref 39.0–52.0)
Hemoglobin: 16.1 g/dL (ref 13.0–17.0)
MCH: 32.2 pg (ref 26.0–34.0)
MCHC: 35 g/dL (ref 30.0–36.0)
MCV: 92 fL (ref 78.0–100.0)
PLATELETS: 244 10*3/uL (ref 150–400)
RBC: 5 MIL/uL (ref 4.22–5.81)
RDW: 13.8 % (ref 11.5–15.5)
WBC: 20 10*3/uL — AB (ref 4.0–10.5)

## 2016-03-07 LAB — CULTURE, RESPIRATORY W GRAM STAIN

## 2016-03-07 LAB — GLUCOSE, CAPILLARY
GLUCOSE-CAPILLARY: 91 mg/dL (ref 65–99)
Glucose-Capillary: 83 mg/dL (ref 65–99)
Glucose-Capillary: 118 mg/dL — ABNORMAL HIGH (ref 65–99)
Glucose-Capillary: 233 mg/dL — ABNORMAL HIGH (ref 65–99)

## 2016-03-07 LAB — CULTURE, RESPIRATORY: CULTURE: NORMAL

## 2016-03-07 MED ORDER — LOPERAMIDE HCL 2 MG PO CAPS
2.0000 mg | ORAL_CAPSULE | ORAL | Status: DC | PRN
  Administered 2016-03-07: 2 mg via ORAL
  Filled 2016-03-07: qty 1

## 2016-03-07 MED ORDER — AZITHROMYCIN 500 MG PO TABS
500.0000 mg | ORAL_TABLET | Freq: Every day | ORAL | Status: DC
  Administered 2016-03-07 – 2016-03-08 (×2): 500 mg via ORAL
  Filled 2016-03-07 (×2): qty 1

## 2016-03-07 NOTE — Progress Notes (Signed)
O2 weaned this am; turned down to 1 L/hr.  Patient tolerating well.

## 2016-03-07 NOTE — Progress Notes (Signed)
Occupational Therapy Treatment Patient Details Name: Scott Ward MRN: [id number] DOB: 01/25/1941 Today's Date: 03/07/2016    History of present illness Scott Ward is a 76 y.o. male with a past medical history of hypertension, diabetes, which is primarily diet controlled, tobacco abuse, no known lung disease, who was in his usual state of health till about a week ago when he started developing a cough. This was followed by an episode of weakness. In ED he was tachypneic with O2 sat 88% and chest x-ray suggestive of PNA.    OT comments  Pt. At 92% o2 in resting on o2 Pt. At 92% after seated LB dressing task   Pt. Able to complete LB dressing task while maintaining o2 levels with use of compensatory strategies and breathing techniques.  Will continue to follow acutely.    Follow Up Recommendations  Home health OT;Supervision - Intermittent    Equipment Recommendations  None recommended by OT    Recommendations for Other Services      Precautions / Restrictions Precautions Precautions: Fall Precaution Comments: watch O2 sats       Mobility Bed Mobility               General bed mobility comments: Pt OOB in chair upon arrival  Transfers                 General transfer comment: pt. seated in recliner at beginning and end of session    Balance                                   ADL Overall ADL's : Needs assistance/impaired                     Lower Body Dressing: Set up;Sitting/lateral leans Lower Body Dressing Details (indicate cue type and reason): don/doff socks seated while bending forward each time               General ADL Comments: pt. on o2 during session.  able to complete task with cues for energy conservation and necessary rest breaks.        Vision                     Perception     Praxis      Cognition   Behavior During Therapy: WFL for tasks assessed/performed Overall Cognitive Status: Within  Functional Limits for tasks assessed                       Extremity/Trunk Assessment               Exercises     Shoulder Instructions       General Comments      Pertinent Vitals/ Pain       Pain Assessment: No/denies pain  Home Living                                          Prior Functioning/Environment              Frequency  Min 2X/week        Progress Toward Goals  OT Goals(current goals can now be found in the care plan section)  Progress towards OT goals: Progressing toward goals  Plan Discharge plan remains appropriate    Co-evaluation                 End of Session Equipment Utilized During Treatment: Oxygen   Activity Tolerance Patient tolerated treatment well   Patient Left in chair;with call bell/phone within reach   Nurse Communication          Time: [id number] OT Time Calculation (min): 10 min  Charges: OT General Charges $OT Visit: 1 Procedure OT Treatments $Self Care/Home Management : 8-22 mins  Janice Coffin, COTA/L 03/07/2016, 3:31 PM

## 2016-03-07 NOTE — Progress Notes (Signed)
Patient ID: Scott Ward, male   DOB: [birth year], 76 y.o.   MRN: [id number]  PROGRESS NOTE    Scott Ward  [id number] DOB: 1941-02-27 DOA: 03/02/2016  PCP: Odette Fraction, MD   Brief Narrative:   76 y.o. male with a past medical history of hypertension, diabetes, primarily diet controlled, tobacco abuse. Patient presented to [facility] 03/02/2016 with worsening shortness of breath, wheezing and cough productive of greenish sputum for past week prior to this admission. Patient also reported subjective fevers. No sick contacts at home. Patient also reported generalized weakness and fatigue.  On admission, patient was hemodynamically stable. His oxygen saturation was 88% on room air but has improved to 94% with nasal cannula oxygen support. His chest x-ray showed right middle lobe pneumonia. He was started on azithromycin and Rocephin and admitted for management of COPD exacerbation and pneumonia.   Assessment & Plan:   Acute respiratory failure with hypoxia / acute exacerbation of COPD - Hypoxia likely secondary to combination of COPD and pneumonia - Continue Albuterol and Atrovent every 4 hours scheduled and every 2 hours as needed for shortness of breath or wheezing - Continue daily prednisone (solumedrol stopped 1/5) - Continue oxygen support via nasal cannula to keep oxygen saturation above 90%  Right middle lobe pneumonia / leukocytosis - Chest x-ray concerning for right middle lobe pneumonia - Continue azithromycin and Rocephin - Respiratory culture reincubated for better growth  - HIV and strep pneumonia negative - Legionella is negative   Essential hypertension - Continue lisinopril   Tobacco abuse  - Continue nicotine patch  - Counseled on smoking cessation   Steroid induced hypoglycemia - A1c 6.9 on this admission - Continue NovoLog 3 units 3 times a day before meals and sliding scale insulin  Dyslipidemia - Continue simvastatin 10 mg at bedtime   DVT  prophylaxis: Lovenox subcutaneous Code Status: full code  Family Communication: Patient's son and grandson at the bedside 1/5 Disposition Plan: Anticipate discharge by 03/08/2016   Consultants:   PT  Procedures:   None   Antimicrobials:   Azithromycin and Rocephin 03/03/2015 -->   Subjective: No overnight events.  Objective: Vitals:   03/05/16 1955 03/06/16 0446 03/06/16 2100 03/07/16 0440  BP: (!) 167/79 (!) 154/82 (!) 170/88 (!) 160/90  Pulse: 76 74 74 66  Resp: 20  19 19   Temp: 98.7 F (37.1 C) 98.4 F (36.9 C) 97.6 F (36.4 C) 98.2 F (36.8 C)  TempSrc: Oral Oral Oral Oral  SpO2: 94% 93% 93% 95%  Weight:      Height:        Intake/Output Summary (Last 24 hours) at 03/07/16 1304 Last data filed at 03/07/16 0900  Gross per 24 hour  Intake              480 ml  Output                0 ml  Net              480 ml   Filed Weights   03/02/16 2006 03/04/16 0417 03/05/16 0320  Weight: 103 kg (227 lb 1.6 oz) 105.2 kg (231 lb 14.4 oz) 104.9 kg (231 lb 4.8 oz)    Examination:  General exam: No distress, calm and comfortable Respiratory system: Diminished breath sounds but no wheezing  Cardiovascular system: S1 & S2 heard, RRR Gastrointestinal system: (+) BS, non tender, non distended Central nervous system: Nonfocal Extremities: No swelling, palpable pulses bilaterally  Skin:  warm and dry  Psychiatry: Normal behavior   Data Reviewed: I have personally reviewed following labs and imaging studies  CBC:  Recent Labs Lab 03/03/16 0223 03/04/16 0154 03/05/16 0354 03/06/16 0218 03/07/16 0323  WBC 14.6* 25.9* 28.4* 20.6* 20.0*  HGB 14.2 13.8 14.2 14.5 16.1  HCT 41.5 38.8* 41.0 42.6 46.0  MCV 92.6 90.4 91.3 92.4 92.0  PLT 192 215 235 226 [id number]   Basic Metabolic Panel:  Recent Labs Lab 03/02/16 1227 03/03/16 0223 03/04/16 0154 03/05/16 0354  NA 139 141 141 142  K 3.8 3.9 3.9 4.8  CL 104 105 106 105  CO2 24 27 28 29   GLUCOSE 135* 240* 153* 135*    BUN 25* 26* 34* 37*  CREATININE 0.98 1.02 0.98 0.95  CALCIUM 8.8* 8.9 8.8* 8.7*   GFR: Estimated Creatinine Clearance: 79 mL/min (by C-G formula based on SCr of 0.95 mg/dL). Liver Function Tests:  Recent Labs Lab 03/03/16 0223  AST 52*  ALT 42  ALKPHOS 77  BILITOT 0.4  PROT 6.1*  ALBUMIN 2.7*   No results for input(s): LIPASE, AMYLASE in the last 168 hours. No results for input(s): AMMONIA in the last 168 hours. Coagulation Profile:  Recent Labs Lab 03/02/16 1227  INR 1.12   Cardiac Enzymes: No results for input(s): CKTOTAL, CKMB, CKMBINDEX, TROPONINI in the last 168 hours. BNP (last 3 results) No results for input(s): PROBNP in the last 8760 hours. HbA1C: No results for input(s): HGBA1C in the last 72 hours. CBG:  Recent Labs Lab 03/06/16 1120 03/06/16 1640 03/06/16 2002 03/07/16 0623 03/07/16 1143  GLUCAP 137* 201* 119* 83 118*   Lipid Profile: No results for input(s): CHOL, HDL, LDLCALC, TRIG, CHOLHDL, LDLDIRECT in the last 72 hours. Thyroid Function Tests: No results for input(s): TSH, T4TOTAL, FREET4, T3FREE, THYROIDAB in the last 72 hours. Anemia Panel: No results for input(s): VITAMINB12, FOLATE, FERRITIN, TIBC, IRON, RETICCTPCT in the last 72 hours. Urine analysis: No results found for: COLORURINE, APPEARANCEUR, [place], [place], [place], [place], [place], [place], [place], UROBILINOGEN, NITRITE, LEUKOCYTESUR Sepsis Labs: @LABRCNTIP (procalcitonin:4,lacticidven:4)   Recent Results (from the past 240 hour(s))  Culture, sputum-assessment     Status: None   Collection Time: 03/03/16  3:44 AM  Result Value Ref Range Status   Specimen Description EXPECTORATED SPUTUM  Final   Special Requests NONE  Final   Sputum evaluation THIS SPECIMEN IS ACCEPTABLE FOR SPUTUM CULTURE  Final   Report Status 03/04/2016 FINAL  Final  Culture, respiratory (NON-Expectorated)     Status: None   Collection Time: 03/03/16  3:44 AM  Result Value Ref Range Status    Specimen Description EXPECTORATED SPUTUM  Final   Special Requests NONE Reflexed from [id number]  Final   Gram Stain   Final    ABUNDANT WBC PRESENT,BOTH PMN AND MONONUCLEAR FEW GRAM NEGATIVE RODS RARE GRAM POSITIVE COCCI IN PAIRS FEW GRAM POSITIVE RODS FEW SQUAMOUS EPITHELIAL CELLS PRESENT    Culture Consistent with normal respiratory flora.  Final   Report Status 03/07/2016 FINAL  Final      Radiology Studies: Dg Chest 2 View Result Date: 03/02/2016 RIGHT middle lobe pneumonia. Electronically Signed   By: Staci Righter M.D.   On: 03/02/2016 14:46     Scheduled Meds: . aspirin EC  81 mg Oral Daily  . azithromycin  500 mg Intravenous Q24H  . cefTRIAXone (ROCEPHIN)  IV  1 g Intravenous Q24H  . enoxaparin (LOVENOX) injection  40 mg Subcutaneous Q24H  . insulin aspart  0-9  Units Subcutaneous TID WC  . insulin aspart  3 Units Subcutaneous TID WC  . lisinopril  20 mg Oral Daily  . multivitamin with minerals  1 tablet Oral Daily  . nicotine  14 mg Transdermal Daily  . polyethylene glycol  17 g Oral Daily  . predniSONE  50 mg Oral Q breakfast  . senna  1 tablet Oral BID  . silver sulfADIAZINE   Topical BID  . simvastatin  10 mg Oral QHS  . sodium chloride flush  3 mL Intravenous Q12H  . verapamil  120 mg Oral QHS   Continuous Infusions:   LOS: 5 days    Time spent: 15 minutes  Greater than 50% of the time spent on counseling and coordinating the care.   Leisa Lenz, MD [facility] Pager [phone]  If 7PM-7AM, please contact night-coverage [email or web address] Password [email or web address] 03/07/2016, 1:04 PM

## 2016-03-08 ENCOUNTER — Ambulatory Visit: Payer: Medicare HMO | Admitting: Family Medicine

## 2016-03-08 LAB — CBC
HEMATOCRIT: 43.4 % (ref 39.0–52.0)
HEMOGLOBIN: 14.9 g/dL (ref 13.0–17.0)
MCH: 31.7 pg (ref 26.0–34.0)
MCHC: 34.3 g/dL (ref 30.0–36.0)
MCV: 92.3 fL (ref 78.0–100.0)
Platelets: 217 10*3/uL (ref 150–400)
RBC: 4.7 MIL/uL (ref 4.22–5.81)
RDW: 13.9 % (ref 11.5–15.5)
WBC: 14.9 10*3/uL — ABNORMAL HIGH (ref 4.0–10.5)

## 2016-03-08 LAB — BASIC METABOLIC PANEL
ANION GAP: 8 (ref 5–15)
BUN: 27 mg/dL — AB (ref 6–20)
CHLORIDE: 101 mmol/L (ref 101–111)
CO2: 30 mmol/L (ref 22–32)
Calcium: 8.4 mg/dL — ABNORMAL LOW (ref 8.9–10.3)
Creatinine, Ser: 1 mg/dL (ref 0.61–1.24)
GFR calc Af Amer: >60 mL/min (ref >60)
GFR calc non Af Amer: >60 mL/min (ref >60)
GLUCOSE: 100 mg/dL — AB (ref 65–99)
Potassium: 4.2 mmol/L (ref 3.5–5.1)
Sodium: 139 mmol/L (ref 135–145)

## 2016-03-08 LAB — GLUCOSE, CAPILLARY
GLUCOSE-CAPILLARY: 147 mg/dL — AB (ref 65–99)
Glucose-Capillary: 82 mg/dL (ref 65–99)
Glucose-Capillary: 96 mg/dL (ref 65–99)

## 2016-03-08 MED ORDER — ALBUTEROL SULFATE HFA 108 (90 BASE) MCG/ACT IN AERS: 2.0000 | INHALATION_SPRAY | Freq: Four times a day (QID) | RESPIRATORY_TRACT | 0 refills | Status: DC | PRN

## 2016-03-08 MED ORDER — LEVOFLOXACIN 500 MG PO TABS: 500.0000 mg | ORAL_TABLET | Freq: Every day | ORAL | 0 refills | Status: AC

## 2016-03-08 MED ORDER — ACETAMINOPHEN 325 MG PO TABS: 650.0000 mg | ORAL_TABLET | Freq: Four times a day (QID) | ORAL | 0 refills | Status: DC | PRN

## 2016-03-08 MED ORDER — IPRATROPIUM-ALBUTEROL 0.5-2.5 (3) MG/3ML IN SOLN: 3.0000 mL | RESPIRATORY_TRACT | 1 refills | Status: DC | PRN

## 2016-03-08 NOTE — Care Management Important Message (Signed)
Important Message  Patient Details  Name: Scott Ward MRN: [id number] Date of Birth: Dec 31, 1940   Medicare Important Message Given:  Yes    Nathen May 03/08/2016, 12:50 PM

## 2016-03-08 NOTE — Care Management Note (Signed)
Case Management Note Scott Gibbons RN, BSN Unit 2W-Case Manager [phone]  Patient Details  Name: Scott Ward MRN: [id number] Date of Birth: [birth year]  Subjective/Objective:   Pt admitted with CAP                 Action/Plan: PTA pt lived at home alone, per PT eval recommendations for HHPT, may need home 02- will monitor and see if pt able to wean off prior to d/c. CM to follow for d/c needs.   Expected Discharge Date:    03/08/16              Expected Discharge Plan:  Scott Ward  In-House Referral:     Discharge planning Services  CM Consult  Post Acute Care Choice:  Home Health, Durable Medical Equipment Choice offered to:  Patient, Adult Children  DME Arranged:  Nebulizer machine, Oxygen DME Agency:  [facility]:  RN, PT, OT, Nurse's Aide, Patient Refused [facility] Agency:     Status of Service:  Completed, signed off  If discussed at [place] of Stay Meetings, dates discussed:    Discharge Disposition: home/self care   Additional Comments:  03/08/16- 1520- Tyra Gural RN, CM- pt for d/c home today with son- will need home 02 and nebulizer- orders have  Been placed and qualifying note in epic for home 02- spoke with Larene Beach at [facility] regarding DME needs- portable 02 tank and nebulizer to be delivered to room prior to discharge- f/u done with pt's son regarding HH needs- per son they have decided that they will not need HH services and son politely declines any HH services referral at this time.   03/05/16- 1015- Christiana Gurevich RN, CM- f/u done with pt's son at the beside regarding d/c needs and HH- per son pt will have needed DME at home- still watching 02 sats here to determine if pt will need home 02- son has not yet decided if he wants [facility] services (has the list) CM will f/u with son prior to discharge regarding d/c needs and HH- also provided son with list of private duty agencies per request.   03/15/15- 1600- Scott Gibbons RN,  CM- attempted to f/u with son at bedside regarding [facility] choice- per pt son has left to go "take care of somethings" informed pt that CM will f/u in am when son returns.   [name], RN 03/08/2016, 3:28 PM

## 2016-03-08 NOTE — Progress Notes (Signed)
Physical Therapy Treatment Patient Details Name: Scott Ward MRN: [id number] DOB: 02-25-1941 Today's Date: 03/08/2016    History of Present Illness Scott Ward is a 76 y.o. male with a past medical history of hypertension, diabetes, which is primarily diet controlled, tobacco abuse, no known lung disease, who was in his usual state of health till about a week ago when he started developing a cough. This was followed by an episode of weakness. In ED he was tachypneic with O2 sat 88% and chest x-ray suggestive of PNA.     PT Comments    Pt functioning near baseline. Pt tolerated ambulation and stair negotiation well on RA, denying SOB however SpO2 did drop to 79-80% during stair negotiation. MD made aware. Acute PT to con't to follow.    Follow Up Recommendations  Home health PT;Supervision - Intermittent (HH for management of O2 lines during function in home and community)     Equipment Recommendations  None recommended by PT    Recommendations for Other Services       Precautions / Restrictions Precautions Precautions: Fall Precaution Comments: watch O2 sats Restrictions Weight Bearing Restrictions: No    Mobility  Bed Mobility               General bed mobility comments: pt up in chair  Transfers Overall transfer level: Needs assistance Equipment used: None Transfers: Sit to/from Stand Sit to Stand: Supervision         General transfer comment: pt with good use of UEs to push up from chair  Ambulation/Gait Ambulation/Gait assistance: Min guard Ambulation Distance (Feet): 250 Feet Assistive device: None Gait Pattern/deviations: Step-through pattern (decreased step height) Gait velocity: decreased Gait velocity interpretation: Below normal speed for age/gender General Gait Details: SpO2 >90% on RA during amb, no overt LOB, pt with short shuffled steps, denies SOB   Stairs Stairs: Yes   Stair Management: One rail Left;Alternating pattern;Step to  pattern;Backwards;Forwards Number of Stairs: 2 General stair comments: Pt SpO2 dec to 79-80% on RA, took 5 min to recover to 88%, denies SOB  Wheelchair Mobility    Modified Rankin (Stroke Patients Only)       Balance Overall balance assessment: Needs assistance         Standing balance support: Single extremity supported Standing balance-Leahy Scale: Fair                      Cognition Arousal/Alertness: Awake/alert Behavior During Therapy: WFL for tasks assessed/performed Overall Cognitive Status: Within Functional Limits for tasks assessed                      Exercises      General Comments        Pertinent Vitals/Pain Pain Assessment: No/denies pain    Home Living                      Prior Function            PT Goals (current goals can now be found in the care plan section) Acute Rehab PT Goals Patient Stated Goal: home soon Progress towards PT goals: Progressing toward goals    Frequency    Min 3X/week      PT Plan Current plan remains appropriate    Co-evaluation             End of Session Equipment Utilized During Treatment: Gait belt Activity Tolerance: Patient tolerated treatment well Patient  left: in chair;with call bell/phone within reach     Time: 1159-1211 PT Time Calculation (min) (ACUTE ONLY): 12 min  Charges:  $Gait Training: 8-22 mins                    G Codes:      Koleen Distance Carrianne Hyun 2016-03-26, 12:18 PM   Kittie Plater, PT, DPT Pager #: [phone] Office #: [phone]

## 2016-03-08 NOTE — Discharge Summary (Signed)
Physician Discharge Summary  Scott Ward [id number] DOB: [birth year] DOA: 03/02/2016  PCP: Odette Fraction, MD  Admit date: 03/02/2016 Discharge date: 03/08/2016  Recommendations for Outpatient Follow-up:  Continue Levaquin for 1 more day on discharge  Discharge Diagnoses:  Principal Problem:   CAP (community acquired pneumonia) Active Problems:   Diabetes mellitus without complication ([place])   Acute respiratory failure ([place])   Acute exacerbation of chronic obstructive pulmonary disease (COPD) ([place])   Essential hypertension    Discharge Condition: stable   Diet recommendation: as tolerated   History of present illness:  76 y.o.malewith a past medical history of hypertension, diabetes, primarily diet controlled, tobacco abuse. Patient presented to [facility] 03/02/2016 with worsening shortness of breath, wheezing and cough productive of greenish sputum for past week prior to this admission. Patient also reported subjective fevers. No sick contacts at home. Patient also reported generalized weakness and fatigue.  On admission, patient was hemodynamically stable. His oxygen saturation was 88% on room air but has improved to 94% with nasal cannula oxygen support. His chest x-ray showed right middle lobe pneumonia. He was started on azithromycin and Rocephin and admitted for management of COPD exacerbation and pneumonia.  Hospital Course:    Assessment & Plan:   Acute respiratory failure with hypoxia / acute exacerbation of COPD - Hypoxia likely secondary to combination of COPD and pneumonia - Continue duoneb every 4 hours as needed on discharge - Continue albuterol inhaler as needed per home regimen  - Stop prednisone today  - Continue oxygen support via nasal cannula to keep oxygen saturation above 90%  Right middle lobe pneumonia / leukocytosis - Chest x-ray concerning for right middle lobe pneumonia - Continue azithromycin and Rocephin through today and then Levaquin  for 1 more day on discharge  - Respiratory culture reincubated for better growth  - HIV and strep pneumonia negative - Legionella is negative   Essential hypertension - Continue lisinopril   Tobacco abuse  - Continue nicotine patch  - Counseled on smoking cessation   Steroid induced hypoglycemia - A1c 6.9 on this admission  - Spoke with pt son at the bedside, since pt is not needing steroids he prefers to follow with PCP who can recheck A1c and determine if pt needs antihyperglycemics   Dyslipidemia - Continue simvastatin 10 mg at bedtime   DVT prophylaxis: Lovenox subcutaneous in hospital  Code Status: full code  Family Communication: Patient's son and grandsons at the bedside 1/5    Consultants:   PT  Procedures:   None   Antimicrobials:   Azithromycin and Rocephin 03/03/2015 --> 03/08/2016   Signed:  Leisa Lenz, MD  [facility] 03/08/2016, 11:29 AM  Pager #: [phone]  Time spent in minutes: less than 30 minutes   Discharge Exam: Vitals:   03/07/16 2202 03/08/16 0740  BP: 136/69 (!) 149/86  Pulse: 65 83  Resp: 19 19  Temp: 98.3 F (36.8 C) 98 F (36.7 C)   Vitals:   03/07/16 0440 03/07/16 1603 03/07/16 2202 03/08/16 0740  BP: (!) 160/90 (!) 155/80 136/69 (!) 149/86  Pulse: 66 70 65 83  Resp: 19 19 19 19   Temp: 98.2 F (36.8 C) 98.2 F (36.8 C) 98.3 F (36.8 C) 98 F (36.7 C)  TempSrc: Oral Oral Oral Oral  SpO2: 95% 95% 90% (!) 89%  Weight:      Height:        General: Pt is alert, follows commands appropriately, not in acute distress Cardiovascular: Regular rate  and rhythm, S1/S2 + Respiratory: Clear to auscultation bilaterally, no wheezing, no crackles, no rhonchi Abdominal: Soft, non tender, non distended, bowel sounds +, no guarding Extremities: no cyanosis, pulses palpable bilaterally DP and PT Neuro: Grossly nonfocal  Discharge Instructions  Discharge Instructions    Call MD for:  persistant nausea and  vomiting    Complete by:  As directed    Call MD for:  redness, tenderness, or signs of infection (pain, swelling, redness, odor or green/yellow discharge around incision site)    Complete by:  As directed    Call MD for:  severe uncontrolled pain    Complete by:  As directed    Diet - low sodium heart healthy    Complete by:  As directed    Discharge instructions    Complete by:  As directed    Continue Levaquin for 1 more day on discharge   Increase activity slowly    Complete by:  As directed      Allergies as of 03/08/2016      Reactions   Metformin And Related Other (See Comments)   Per pt gives him too much gas and refuses to take.      Medication List    STOP taking these medications   DAYQUIL/NYQUIL COLD/FLU RELIEF PO   ibuprofen 200 MG tablet Commonly known as:  ADVIL,MOTRIN     TAKE these medications   acetaminophen 325 MG tablet Commonly known as:  TYLENOL Take 2 tablets (650 mg total) by mouth every 6 (six) hours as needed for mild pain (or Fever >/= 101).   albuterol 108 (90 Base) MCG/ACT inhaler Commonly known as:  PROVENTIL HFA;VENTOLIN HFA Inhale 2 puffs into the lungs every 6 (six) hours as needed for wheezing or shortness of breath.   aspirin 81 MG tablet Take 81 mg by mouth daily.   ipratropium-albuterol 0.5-2.5 (3) MG/3ML Soln Commonly known as:  DUONEB Take 3 mLs by nebulization every 4 (four) hours as needed.   levofloxacin 500 MG tablet Commonly known as:  LEVAQUIN Take 1 tablet (500 mg total) by mouth daily. Start taking on:  03/09/2016   lisinopril 20 MG tablet Commonly known as:  PRINIVIL,ZESTRIL Take 1 tablet (20 mg total) by mouth daily.   simvastatin 10 MG tablet Commonly known as:  ZOCOR Take 1 tablet (10 mg total) by mouth at bedtime.   verapamil 240 MG CR tablet Commonly known as:  CALAN-SR TAKE 1 TABLET (240 MG TOTAL) BY MOUTH AT BEDTIME.            Durable Medical Equipment        Start     Ordered   03/08/16 1031   For home use only DME Nebulizer/meds  Once    Question:  Patient needs a nebulizer to treat with the following condition  Answer:  COPD (chronic obstructive pulmonary disease) ([place])   03/08/16 1030   03/08/16 1031  For home use only DME oxygen  Once    Question Answer Comment  Mode or (Route) Nasal cannula   Liters per Minute 3   Frequency Continuous (stationary and portable oxygen unit needed)   Oxygen conserving device Yes   Oxygen delivery system Gas      03/08/16 1030   03/08/16 1030  For home use only DME Nebulizer machine  Once    Question:  Patient needs a nebulizer to treat with the following condition  Answer:  COPD (chronic obstructive pulmonary disease) ([place])   03/08/16 1030  Follow-up Information    [name],WARREN TOM, MD. Schedule an appointment as soon as possible for a visit in 1 week(s).   Specialty:  Family Medicine Contact information: [address] [place] [address] [phone]            The results of significant diagnostics from this hospitalization (including imaging, microbiology, ancillary and laboratory) are listed below for reference.    Significant Diagnostic Studies: Dg Chest 2 View  Result Date: 03/02/2016 CLINICAL DATA:  Productive cough with hypoxia. EXAM: CHEST  2 VIEW COMPARISON:  01/18/2013. FINDINGS: Upright AP and lateral radiographs demonstrates asymmetric opacity in the RIGHT middle lobe consistent with pneumonia. Findings represent an interval change from priors. Mild bibasilar subsegmental atelectasis. No effusion or pneumothorax. Low lung volumes accentuate cardiac size which could be mildly increased. Thoracic atherosclerosis. No acute osseous findings. IMPRESSION: RIGHT middle lobe pneumonia. Electronically Signed   By: Staci Righter M.D.   On: 03/02/2016 14:46    Microbiology: Recent Results (from the past 240 hour(s))  Culture, sputum-assessment     Status: None   Collection Time: 03/03/16  3:44 AM  Result Value  Ref Range Status   Specimen Description EXPECTORATED SPUTUM  Final   Special Requests NONE  Final   Sputum evaluation THIS SPECIMEN IS ACCEPTABLE FOR SPUTUM CULTURE  Final   Report Status 03/04/2016 FINAL  Final  Culture, respiratory (NON-Expectorated)     Status: None   Collection Time: 03/03/16  3:44 AM  Result Value Ref Range Status   Specimen Description EXPECTORATED SPUTUM  Final   Special Requests NONE Reflexed from [id number]  Final   Gram Stain   Final    ABUNDANT WBC PRESENT,BOTH PMN AND MONONUCLEAR FEW GRAM NEGATIVE RODS RARE GRAM POSITIVE COCCI IN PAIRS FEW GRAM POSITIVE RODS FEW SQUAMOUS EPITHELIAL CELLS PRESENT    Culture Consistent with normal respiratory flora.  Final   Report Status 03/07/2016 FINAL  Final     Labs: Basic Metabolic Panel:  Recent Labs Lab 03/02/16 1227 03/03/16 0223 03/04/16 0154 03/05/16 0354 03/08/16 0342  NA 139 141 141 142 139  K 3.8 3.9 3.9 4.8 4.2  CL 104 105 106 105 101  CO2 24 27 28 29 30   GLUCOSE 135* 240* 153* 135* 100*  BUN 25* 26* 34* 37* 27*  CREATININE 0.98 1.02 0.98 0.95 1.00  CALCIUM 8.8* 8.9 8.8* 8.7* 8.4*   Liver Function Tests:  Recent Labs Lab 03/03/16 0223  AST 52*  ALT 42  ALKPHOS 77  BILITOT 0.4  PROT 6.1*  ALBUMIN 2.7*   No results for input(s): LIPASE, AMYLASE in the last 168 hours. No results for input(s): AMMONIA in the last 168 hours. CBC:  Recent Labs Lab 03/04/16 0154 03/05/16 0354 03/06/16 0218 03/07/16 0323 03/08/16 0342  WBC 25.9* 28.4* 20.6* 20.0* 14.9*  HGB 13.8 14.2 14.5 16.1 14.9  HCT 38.8* 41.0 42.6 46.0 43.4  MCV 90.4 91.3 92.4 92.0 92.3  PLT 215 235 226 244 217   Cardiac Enzymes: No results for input(s): CKTOTAL, CKMB, CKMBINDEX, TROPONINI in the last 168 hours. BNP: BNP (last 3 results)  Recent Labs  03/02/16 1356  BNP 127.8*    ProBNP (last 3 results) No results for input(s): PROBNP in the last 8760 hours.  CBG:  Recent Labs Lab 03/07/16 1143 03/07/16 1557  03/07/16 2205 03/08/16 0739 03/08/16 1126  GLUCAP 118* 233* 91 82 147*

## 2016-03-08 NOTE — Progress Notes (Signed)
SATURATION QUALIFICATIONS: (This note is used to comply with regulatory documentation for home oxygen)  Patient Saturations on Room Air at Rest = 90%  Patient Saturations on Room Air while Ambulating = 79%  Patient Saturations on 1 Liters of oxygen while Ambulating = 91%  Please briefly explain why patient needs home oxygen: Hx of COPD,  Dx of PNA, desats with activity

## 2016-03-08 NOTE — Discharge Instructions (Signed)
Chronic Obstructive Pulmonary Disease Chronic obstructive pulmonary disease (COPD) is a common lung condition in which airflow from the lungs is limited. COPD is a general term that can be used to describe many different lung problems that limit airflow, including both chronic bronchitis and emphysema. If you have COPD, your lung function will probably never return to normal, but there are measures you can take to improve lung function and make yourself feel better. What are the causes?  Smoking (common).  Exposure to secondhand smoke.  Genetic problems.  Chronic inflammatory lung diseases or recurrent infections. What are the signs or symptoms?  Shortness of breath, especially with physical activity.  Deep, persistent (chronic) cough with a large amount of thick mucus.  Wheezing.  Rapid breaths (tachypnea).  Gray or bluish discoloration (cyanosis) of the skin, especially in your fingers, toes, or lips.  Fatigue.  Weight loss.  Frequent infections or episodes when breathing symptoms become much worse (exacerbations).  Chest tightness. How is this diagnosed? Your health care provider will take a medical history and perform a physical examination to diagnose COPD. Additional tests for COPD may include:  Lung (pulmonary) function tests.  Chest X-ray.  CT scan.  Blood tests. How is this treated? Treatment for COPD may include:  Inhaler and nebulizer medicines. These help manage the symptoms of COPD and make your breathing more comfortable.  Supplemental oxygen. Supplemental oxygen is only helpful if you have a low oxygen level in your blood.  Exercise and physical activity. These are beneficial for nearly all people with COPD.  Lung surgery or transplant.  Nutrition therapy to gain weight, if you are underweight.  Pulmonary rehabilitation. This may involve working with a team of health care providers and specialists, such as respiratory, occupational, and physical  therapists. Follow these instructions at home:  Take all medicines (inhaled or pills) as directed by your health care provider.  Avoid over-the-counter medicines or cough syrups that dry up your airway (such as antihistamines) and slow down the elimination of secretions unless instructed otherwise by your health care provider.  If you are a smoker, the most important thing that you can do is stop smoking. Continuing to smoke will cause further lung damage and breathing trouble. Ask your health care provider for help with quitting smoking. He or she can direct you to community resources or hospitals that provide support.  Avoid exposure to irritants such as smoke, chemicals, and fumes that aggravate your breathing.  Use oxygen therapy and pulmonary rehabilitation if directed by your health care provider. If you require home oxygen therapy, ask your health care provider whether you should purchase a pulse oximeter to measure your oxygen level at home.  Avoid contact with individuals who have a contagious illness.  Avoid extreme temperature and humidity changes.  Eat healthy foods. Eating smaller, more frequent meals and resting before meals may help you maintain your strength.  Stay active, but balance activity with periods of rest. Exercise and physical activity will help you maintain your ability to do things you want to do.  Preventing infection and hospitalization is very important when you have COPD. Make sure to receive all the vaccines your health care provider recommends, especially the pneumococcal and influenza vaccines. Ask your health care provider whether you need a pneumonia vaccine.  Learn and use relaxation techniques to manage stress.  Learn and use controlled breathing techniques as directed by your health care provider. Controlled breathing techniques include: 1. Pursed lip breathing. Start by breathing in (inhaling)   through your nose for 1 second. Then, purse your lips as  if you were going to whistle and breathe out (exhale) through the pursed lips for 2 seconds. 2. Diaphragmatic breathing. Start by putting one hand on your abdomen just above your waist. Inhale slowly through your nose. The hand on your abdomen should move out. Then purse your lips and exhale slowly. You should be able to feel the hand on your abdomen moving in as you exhale.  Learn and use controlled coughing to clear mucus from your lungs. Controlled coughing is a series of short, progressive coughs. The steps of controlled coughing are: 1. Lean your head slightly forward. 2. Breathe in deeply using diaphragmatic breathing. 3. Try to hold your breath for 3 seconds. 4. Keep your mouth slightly open while coughing twice. 5. Spit any mucus out into a tissue. 6. Rest and repeat the steps once or twice as needed. Contact a health care provider if:  You are coughing up more mucus than usual.  There is a change in the color or thickness of your mucus.  Your breathing is more labored than usual.  Your breathing is faster than usual. Get help right away if:  You have shortness of breath while you are resting.  You have shortness of breath that prevents you from:  Being able to talk.  Performing your usual physical activities.  You have chest pain lasting longer than 5 minutes.  Your skin color is more cyanotic than usual.  You measure low oxygen saturations for longer than 5 minutes with a pulse oximeter. This information is not intended to replace advice given to you by your health care provider. Make sure you discuss any questions you have with your health care provider. Document Released: 11/25/2004 Document Revised: 07/24/2015 Document Reviewed: 10/12/2012 Elsevier Interactive Patient Education  2017 Elsevier Inc.  

## 2016-03-09 DIAGNOSIS — R69 Illness, unspecified: Secondary | ICD-10-CM | POA: Diagnosis not present

## 2016-03-16 ENCOUNTER — Ambulatory Visit (INDEPENDENT_AMBULATORY_CARE_PROVIDER_SITE_OTHER): Payer: Medicare HMO | Admitting: Family Medicine

## 2016-03-16 ENCOUNTER — Encounter: Payer: Self-pay | Admitting: Family Medicine

## 2016-03-16 VITALS — BP 122/64 | HR 80 | Temp 98.4°F | Ht 71.0 in | Wt 225.0 lb

## 2016-03-16 DIAGNOSIS — J189 Pneumonia, unspecified organism: Secondary | ICD-10-CM

## 2016-03-16 DIAGNOSIS — K521 Toxic gastroenteritis and colitis: Secondary | ICD-10-CM

## 2016-03-16 DIAGNOSIS — N400 Enlarged prostate without lower urinary tract symptoms: Secondary | ICD-10-CM

## 2016-03-16 DIAGNOSIS — R0600 Dyspnea, unspecified: Secondary | ICD-10-CM | POA: Diagnosis not present

## 2016-03-16 DIAGNOSIS — J181 Lobar pneumonia, unspecified organism: Secondary | ICD-10-CM

## 2016-03-16 MED ORDER — TAMSULOSIN HCL 0.4 MG PO CAPS: 0.4000 mg | ORAL_CAPSULE | Freq: Every day | ORAL | 3 refills | Status: DC

## 2016-03-16 NOTE — Progress Notes (Signed)
Subjective:    Patient ID: Scott Ward, male    DOB: 24-Feb-1941, 76 y.o.   MRN: [id number]  HPI  Unfortunately, the patient was recently admitted to the hospital with community-acquired pneumonia. I have copied relevant portions of the discharge summary and included them below for my reference:  Admit date: 03/02/2016 Discharge date: 03/08/2016  Recommendations for Outpatient Follow-up:  Continue Levaquin for 1 more day on discharge  Discharge Diagnoses:  Principal Problem:   CAP (community acquired pneumonia) Active Problems:   Diabetes mellitus without complication ([place])   Acute respiratory failure ([place])   Acute exacerbation of chronic obstructive pulmonary disease (COPD) ([place])   Essential hypertension    Discharge Condition: stable   Diet recommendation: as tolerated   History of present illness:  76 y.o.malewith a past medical history of hypertension, diabetes, primarily diet controlled, tobacco abuse. Patient presented to [facility] 03/02/2016 with worsening shortness of breath, wheezing and cough productive of greenish sputum for past week prior to this admission. Patient also reported subjective fevers. No sick contacts at home. Patient also reported generalized weakness and fatigue.  On admission, patient was hemodynamically stable. His oxygen saturation was 88% on room air but has improved to 94% with nasal cannula oxygen support. His chest x-ray showed right middle lobe pneumonia. He was started on azithromycin and Rocephin and admitted for management of COPD exacerbation and pneumonia.  Hospital Course:    Assessment & Plan:  Acute respiratory failure with hypoxia / acute exacerbation of COPD - Hypoxia likely secondary to combination of COPD and pneumonia - Continue duoneb every 4 hours as needed on discharge - Continue albuterol inhaler as needed per home regimen  - Stop prednisone today  - Continue oxygen support via nasal cannula to keep oxygen  saturation above 90%  Right middle lobe pneumonia / leukocytosis - Chest x-ray concerning for right middle lobe pneumonia - Continue azithromycin and Rocephin through today and then Levaquin for 1 more day on discharge  - Respiratory culture reincubated for better growth  - HIV and strep pneumonia negative - Legionella is negative   Essential hypertension - Continue lisinopril   Tobacco abuse  - Continue nicotine patch  - Counseled on smoking cessation   Steroid induced hypoglycemia - A1c 6.9 on this admission  - Spoke with pt son at the bedside, since pt is not needing steroids he prefers to follow with PCP who can recheck A1c and determine if pt needs antihyperglycemics   Dyslipidemia - Continue simvastatin 10 mg at bedtime   03/16/16 Has done remarkably well since discharge from the hospital.  He has successfully weaned off oxygen and is now 98% on room air. He discontinued his lisinopril and verapamil due to low blood pressure. This was morning for 5 days ago and his blood pressure still well controlled. He denies any tachycardia or palpitations. He does report daily diarrhea since leaving the hospital. He states it is extremely watery. Therefore we need to make sure that he has not been exposed to C. difficile. He also has significant pitting edema in both legs. He has +1 pitting edema distal to the knee. He denies any orthopnea. He denies any paroxysmal nocturnal dyspnea. However he certainly has cardiovascular risk factors including smoking, hypertension, prediabetes, and hyperlipidemia. Patient has never had an echocardiogram to evaluate for congestive heart failure he also reports polyuria. He has to rush to the bathroom. However when he gets there he has a weak urinary stream. He also reports a  dribbling stream. He reports 3-4 episodes of nocturia per evening. Past Medical History:  Diagnosis Date  . Arthritis    "lower back" (03/02/2016)  . CAP (community acquired  pneumonia)   . Colon polyp   . COPD (chronic obstructive pulmonary disease) ([place]) dx'd 03/02/2016  . Dermatochalasis   . Diabetes mellitus without complication ([place])   . High blood pressure   . High cholesterol   . History of BPH   . HOH (hard of hearing)   . Nicotine abuse    Past Surgical History:  Procedure Laterality Date  . BLEPHAROPLASTY Bilateral   . CATARACT EXTRACTION W/ INTRAOCULAR LENS  IMPLANT, BILATERAL     Current Outpatient Prescriptions on File Prior to Visit  Medication Sig Dispense Refill  . acetaminophen (TYLENOL) 325 MG tablet Take 2 tablets (650 mg total) by mouth every 6 (six) hours as needed for mild pain (or Fever >/= 101). 30 tablet 0  . aspirin 81 MG tablet Take 81 mg by mouth daily.      Marland Kitchen ipratropium-albuterol (DUONEB) 0.5-2.5 (3) MG/3ML SOLN Take 3 mLs by nebulization every 4 (four) hours as needed. 360 mL 1  . simvastatin (ZOCOR) 10 MG tablet Take 1 tablet (10 mg total) by mouth at bedtime. 90 tablet 0  . albuterol (PROVENTIL HFA;VENTOLIN HFA) 108 (90 Base) MCG/ACT inhaler Inhale 2 puffs into the lungs every 6 (six) hours as needed for wheezing or shortness of breath. (Patient not taking: Reported on 03/16/2016) 1 Inhaler 0  . lisinopril (PRINIVIL,ZESTRIL) 20 MG tablet Take 1 tablet (20 mg total) by mouth daily. (Patient not taking: Reported on 03/16/2016) 90 tablet 3  . verapamil (CALAN-SR) 240 MG CR tablet TAKE 1 TABLET (240 MG TOTAL) BY MOUTH AT BEDTIME. (Patient not taking: Reported on 03/16/2016) 90 tablet 2   No current facility-administered medications on file prior to visit.    Allergies  Allergen Reactions  . Metformin And Related Other (See Comments)    Per pt gives him too much gas and refuses to take.   Social History   Social History  . Marital status: Divorced    Spouse name: N/A  . Number of children: 1  . Years of education: 8   Occupational History  . RETIRED    Social History Main Topics  . Smoking status: Current Every Day  Smoker    Packs/day: 1.00    Years: 60.00    Types: Cigarettes  . Smokeless tobacco: Never Used  . Alcohol use Yes     Comment: 03/02/2016 "nothing since 1973"  . Drug use: No  . Sexual activity: No   Other Topics Concern  . Not on file   Social History Narrative   Patient is single with one child.   Patient is left handed.   Patient has an 8 th grade education.   Patient drinks up to 3 cups daily.    Review of Systems  All other systems reviewed and are negative.      Objective:   Physical Exam  Constitutional: He appears well-developed and well-nourished.  Cardiovascular: Normal rate, regular rhythm and normal heart sounds.   No murmur heard. Pulmonary/Chest: Effort normal and breath sounds normal. No respiratory distress. He has no wheezes. He has no rales. He exhibits no tenderness.  Abdominal: Soft. Bowel sounds are normal. He exhibits no distension and no mass. There is no tenderness. There is no rebound and no guarding.  Musculoskeletal: He exhibits edema.  Vitals reviewed.   not wheezing  today on exam. I can appreciate no crackles Rales in the right lower lobe  He has quit smoking!      Assessment & Plan:  Community acquired pneumonia of right middle lobe of lung ([place]) - Plan: DG Chest 2 View  Benign prostatic hyperplasia without lower urinary tract symptoms - Plan: tamsulosin (FLOMAX) 0.4 MG CAPS capsule  Diarrhea due to drug  Most likely a think the diarrhea is secondary to the antibiotics. I recommended taking a probiotic every day. However I will perform a GI pathogen panel to rule out C. difficile or some other nosocomial infection he may acquired in the hospital. Clinically the pneumonia has resolved. I would like to repeat a chest x-ray in 4 weeks to ensure complete resolution. At the present time the patient does not require any antihypertensives. We will simply monitor his blood pressure and resume them if necessary. His symptoms are consistent with BPH  and we will start the patient on Flomax 0.4 mg by mouth daily at bedtime. I am concerned by the pitting edema in his legs. There is no evidence of fluid overload today on his physical exam the based on his risk factors he is at high risk for congestive heart failure and therefore will check an echocardiogram.

## 2016-03-17 LAB — CBC WITH DIFFERENTIAL/PLATELET
BASOS PCT: 0 %
Basophils Absolute: 0 cells/uL (ref 0–200)
EOS ABS: 83 {cells}/uL (ref 15–500)
Eosinophils Relative: 1 %
HCT: 42.4 % (ref 38.5–50.0)
Hemoglobin: 14.1 g/dL (ref 13.0–17.0)
LYMPHS PCT: 15 %
Lymphs Abs: 1245 cells/uL (ref 850–3900)
MCH: 31.3 pg (ref 27.0–33.0)
MCHC: 33.3 g/dL (ref 32.0–36.0)
MCV: 94 fL (ref 80.0–100.0)
MONO ABS: 1079 {cells}/uL — AB (ref 200–950)
MONOS PCT: 13 %
MPV: 11 fL (ref 7.5–12.5)
NEUTROS PCT: 71 %
Neutro Abs: 5893 cells/uL (ref 1500–7800)
PLATELETS: 249 10*3/uL (ref 140–400)
RBC: 4.51 MIL/uL (ref 4.20–5.80)
RDW: 13.9 % (ref 11.0–15.0)
WBC: 8.3 10*3/uL (ref 3.8–10.8)

## 2016-03-17 LAB — COMPLETE METABOLIC PANEL WITH GFR
ALT: 37 U/L (ref 9–46)
AST: 21 U/L (ref 10–35)
Albumin: 3.3 g/dL — ABNORMAL LOW (ref 3.6–5.1)
Alkaline Phosphatase: 60 U/L (ref 40–115)
BUN: 20 mg/dL (ref 7–25)
CALCIUM: 8.5 mg/dL — AB (ref 8.6–10.3)
CHLORIDE: 103 mmol/L (ref 98–110)
CO2: 29 mmol/L (ref 20–31)
CREATININE: 0.99 mg/dL (ref 0.70–1.18)
GFR, EST AFRICAN AMERICAN: 85 mL/min (ref >=60)
GFR, EST NON AFRICAN AMERICAN: 74 mL/min (ref >=60)
Glucose, Bld: 125 mg/dL — ABNORMAL HIGH (ref 70–99)
POTASSIUM: 4.5 mmol/L (ref 3.5–5.3)
Sodium: 140 mmol/L (ref 135–146)
Total Bilirubin: 0.3 mg/dL (ref 0.2–1.2)
Total Protein: 5.6 g/dL — ABNORMAL LOW (ref 6.1–8.1)

## 2016-03-19 ENCOUNTER — Other Ambulatory Visit: Payer: Medicare HMO

## 2016-03-19 DIAGNOSIS — K521 Toxic gastroenteritis and colitis: Secondary | ICD-10-CM | POA: Diagnosis not present

## 2016-03-23 LAB — GASTROINTESTINAL PATHOGEN PANEL PCR
C. difficile Tox A/B, PCR: NOT DETECTED
Campylobacter, PCR: NOT DETECTED
Cryptosporidium, PCR: NOT DETECTED
E COLI (ETEC) LT/ST, PCR: NOT DETECTED
E COLI (STEC) STX1/STX2, PCR: NOT DETECTED
E COLI 0157, PCR: NOT DETECTED
GIARDIA LAMBLIA, PCR: NOT DETECTED
NOROVIRUS, PCR: NOT DETECTED
Rotavirus A, PCR: NOT DETECTED
SALMONELLA, PCR: NOT DETECTED
SHIGELLA, PCR: NOT DETECTED

## 2016-03-24 ENCOUNTER — Telehealth: Payer: Self-pay | Admitting: Family Medicine

## 2016-03-24 NOTE — Telephone Encounter (Signed)
Calling to say [facility] needs order to discontinue Oxygen sent to them in order for them to come get equipment out of his house.  Fax to [phone].  OK to d/c?

## 2016-03-25 NOTE — Telephone Encounter (Signed)
Order faxed to [facility] to D/C Oxygen

## 2016-03-25 NOTE — Telephone Encounter (Signed)
Ok to dc o2 

## 2016-03-26 ENCOUNTER — Ambulatory Visit (HOSPITAL_COMMUNITY): Payer: Medicare HMO | Attending: Cardiovascular Disease

## 2016-03-26 ENCOUNTER — Other Ambulatory Visit: Payer: Self-pay

## 2016-03-26 DIAGNOSIS — R9439 Abnormal result of other cardiovascular function study: Secondary | ICD-10-CM | POA: Diagnosis not present

## 2016-03-26 DIAGNOSIS — R0600 Dyspnea, unspecified: Secondary | ICD-10-CM | POA: Diagnosis not present

## 2016-03-26 DIAGNOSIS — I501 Left ventricular failure: Secondary | ICD-10-CM | POA: Insufficient documentation

## 2016-04-19 ENCOUNTER — Ambulatory Visit
Admission: RE | Admit: 2016-04-19 | Discharge: 2016-04-19 | Disposition: A | Discharge status: Home / Self Care | Payer: Medicare HMO | Source: Ambulatory Visit | Attending: Family Medicine | Admitting: Family Medicine

## 2016-04-19 ENCOUNTER — Other Ambulatory Visit: Payer: Self-pay | Admitting: Family Medicine

## 2016-04-19 DIAGNOSIS — R0689 Other abnormalities of breathing: Principal | ICD-10-CM

## 2016-04-19 DIAGNOSIS — J189 Pneumonia, unspecified organism: Secondary | ICD-10-CM | POA: Diagnosis not present

## 2016-06-04 ENCOUNTER — Encounter: Payer: Self-pay | Admitting: Family Medicine

## 2016-06-04 ENCOUNTER — Ambulatory Visit (INDEPENDENT_AMBULATORY_CARE_PROVIDER_SITE_OTHER): Payer: Medicare HMO | Admitting: Family Medicine

## 2016-06-04 VITALS — BP 180/92 | HR 73 | Temp 97.9°F | Resp 16 | Wt 226.0 lb

## 2016-06-04 DIAGNOSIS — E119 Type 2 diabetes mellitus without complications: Secondary | ICD-10-CM

## 2016-06-04 DIAGNOSIS — N401 Enlarged prostate with lower urinary tract symptoms: Secondary | ICD-10-CM | POA: Diagnosis not present

## 2016-06-04 DIAGNOSIS — E7800 Pure hypercholesterolemia, unspecified: Secondary | ICD-10-CM

## 2016-06-04 DIAGNOSIS — I10 Essential (primary) hypertension: Secondary | ICD-10-CM

## 2016-06-04 DIAGNOSIS — J438 Other emphysema: Secondary | ICD-10-CM | POA: Diagnosis not present

## 2016-06-04 DIAGNOSIS — R350 Frequency of micturition: Secondary | ICD-10-CM

## 2016-06-04 LAB — CBC WITH DIFFERENTIAL/PLATELET
BASOS PCT: 1 %
Basophils Absolute: 84 cells/uL (ref 0–200)
EOS PCT: 1 %
Eosinophils Absolute: 84 cells/uL (ref 15–500)
HEMATOCRIT: 48 % (ref 38.5–50.0)
Hemoglobin: 16.2 g/dL (ref 13.0–17.0)
LYMPHS ABS: 1260 {cells}/uL (ref 850–3900)
Lymphocytes Relative: 15 %
MCH: 32.3 pg (ref 27.0–33.0)
MCHC: 33.8 g/dL (ref 32.0–36.0)
MCV: 95.6 fL (ref 80.0–100.0)
MPV: 10.8 fL (ref 7.5–12.5)
Monocytes Absolute: 672 cells/uL (ref 200–950)
Monocytes Relative: 8 %
NEUTROS ABS: 6300 {cells}/uL (ref 1500–7800)
Neutrophils Relative %: 75 %
Platelets: 178 10*3/uL (ref 140–400)
RBC: 5.02 MIL/uL (ref 4.20–5.80)
RDW: 14.7 % (ref 11.0–15.0)
WBC: 8.4 10*3/uL (ref 3.8–10.8)

## 2016-06-04 LAB — COMPLETE METABOLIC PANEL WITH GFR
ALT: 11 U/L (ref 9–46)
AST: 15 U/L (ref 10–35)
Albumin: 4 g/dL (ref 3.6–5.1)
Alkaline Phosphatase: 76 U/L (ref 40–115)
BUN: 18 mg/dL (ref 7–25)
CALCIUM: 8.9 mg/dL (ref 8.6–10.3)
CHLORIDE: 107 mmol/L (ref 98–110)
CO2: 28 mmol/L (ref 20–31)
CREATININE: 1.01 mg/dL (ref 0.70–1.18)
GFR, EST AFRICAN AMERICAN: 83 mL/min (ref >=60)
GFR, EST NON AFRICAN AMERICAN: 71 mL/min (ref >=60)
Glucose, Bld: 121 mg/dL — ABNORMAL HIGH (ref 70–99)
Potassium: 4.1 mmol/L (ref 3.5–5.3)
Sodium: 143 mmol/L (ref 135–146)
Total Bilirubin: 0.4 mg/dL (ref 0.2–1.2)
Total Protein: 6.3 g/dL (ref 6.1–8.1)

## 2016-06-04 LAB — LIPID PANEL
CHOLESTEROL: 148 mg/dL (ref <200)
HDL: 42 mg/dL (ref >40)
LDL Cholesterol: 83 mg/dL (ref <100)
TRIGLYCERIDES: 114 mg/dL (ref <150)
Total CHOL/HDL Ratio: 3.5 Ratio (ref <5.0)
VLDL: 23 mg/dL (ref <30)

## 2016-06-04 MED ORDER — LISINOPRIL 20 MG PO TABS: 20.0000 mg | ORAL_TABLET | Freq: Every day | ORAL | 3 refills | Status: DC

## 2016-06-04 MED ORDER — FINASTERIDE 5 MG PO TABS: 5.0000 mg | ORAL_TABLET | Freq: Every day | ORAL | 5 refills | Status: DC

## 2016-06-04 MED ORDER — FUROSEMIDE 40 MG PO TABS: 40.0000 mg | ORAL_TABLET | Freq: Every day | ORAL | 3 refills | Status: DC

## 2016-06-04 NOTE — Progress Notes (Signed)
Subjective:    Patient ID: Scott Ward, male    DOB: 01/06/1941, 76 y.o.   MRN: [id number]  HPI  03/16/16 Has done remarkably well since discharge from the hospital.  He has successfully weaned off oxygen and is now 98% on room air. He discontinued his lisinopril and verapamil due to low blood pressure. This was morning for 5 days ago and his blood pressure still well controlled. He denies any tachycardia or palpitations. He does report daily diarrhea since leaving the hospital. He states it is extremely watery. Therefore we need to make sure that he has not been exposed to C. difficile. He also has significant pitting edema in both legs. He has +1 pitting edema distal to the knee. He denies any orthopnea. He denies any paroxysmal nocturnal dyspnea. However he certainly has cardiovascular risk factors including smoking, hypertension, prediabetes, and hyperlipidemia. Patient has never had an echocardiogram to evaluate for congestive heart failure he also reports polyuria. He has to rush to the bathroom. However when he gets there he has a weak urinary stream. He also reports a dribbling stream. He reports 3-4 episodes of nocturia per evening.  At that time, my plan was: Most likely a think the diarrhea is secondary to the antibiotics. I recommended taking a probiotic every day. However I will perform a GI pathogen panel to rule out C. difficile or some other nosocomial infection he may acquired in the hospital. Clinically the pneumonia has resolved. I would like to repeat a chest x-ray in 4 weeks to ensure complete resolution. At the present time the patient does not require any antihypertensives. We will simply monitor his blood pressure and resume them if necessary. His symptoms are consistent with BPH and we will start the patient on Flomax 0.4 mg by mouth daily at bedtime. I am concerned by the pitting edema in his legs. There is no evidence of fluid overload today on his physical exam the based on his  risk factors he is at high risk for congestive heart failure and therefore will check an echocardiogram.  06/04/16 Results of echo are below: - Left ventricle: The cavity size was normal. Wall thickness was   normal. Systolic function was normal. The estimated ejection   fraction was in the range of 55% to 60%. Wall motion was normal;   there were no regional wall motion abnormalities. Doppler   parameters are consistent with abnormal left ventricular   relaxation (grade 1 diastolic dysfunction). - Left atrium: The atrium was mildly dilated. - Pulmonary arteries: Systolic pressure was mildly increased. PA   peak pressure: 37 mm Hg (S).  The patient has seen no benefit from Flomax. He continues to report urinary frequency and urgency and weak urinary stream. He also reports frequent nocturia. He has significant +1 pitting edema in both legs distal to the knee. He denies orthopnea or paroxysmal nocturnal dyspnea or chest pain. His blood pressure today is significantly elevated. His fasting blood sugars are typically 120. The highest they have seen on a random blood sugar is 150. He denies any polydipsia or blurry vision. He denies any myalgias or right upper quadrant pain Past Medical History:  Diagnosis Date  . Arthritis    "lower back" (03/02/2016)  . CAP (community acquired pneumonia)   . Colon polyp   . COPD (chronic obstructive pulmonary disease) ([place]) dx'd 03/02/2016  . Dermatochalasis   . Diabetes mellitus without complication ([place])   . High blood pressure   . High cholesterol   .  History of BPH   . HOH (hard of hearing)   . Nicotine abuse    Past Surgical History:  Procedure Laterality Date  . BLEPHAROPLASTY Bilateral   . CATARACT EXTRACTION W/ INTRAOCULAR LENS  IMPLANT, BILATERAL     Current Outpatient Prescriptions on File Prior to Visit  Medication Sig Dispense Refill  . acetaminophen (TYLENOL) 325 MG tablet Take 2 tablets (650 mg total) by mouth every 6 (six) hours as needed  for mild pain (or Fever >/= 101). 30 tablet 0  . albuterol (PROVENTIL HFA;VENTOLIN HFA) 108 (90 Base) MCG/ACT inhaler Inhale 2 puffs into the lungs every 6 (six) hours as needed for wheezing or shortness of breath. (Patient not taking: Reported on 03/16/2016) 1 Inhaler 0  . aspirin 81 MG tablet Take 81 mg by mouth daily.      Marland Kitchen ipratropium-albuterol (DUONEB) 0.5-2.5 (3) MG/3ML SOLN Take 3 mLs by nebulization every 4 (four) hours as needed. 360 mL 1  . lisinopril (PRINIVIL,ZESTRIL) 20 MG tablet Take 1 tablet (20 mg total) by mouth daily. (Patient not taking: Reported on 03/16/2016) 90 tablet 3  . simvastatin (ZOCOR) 10 MG tablet Take 1 tablet (10 mg total) by mouth at bedtime. 90 tablet 0  . tamsulosin (FLOMAX) 0.4 MG CAPS capsule Take 1 capsule (0.4 mg total) by mouth daily. 30 capsule 3  . verapamil (CALAN-SR) 240 MG CR tablet TAKE 1 TABLET (240 MG TOTAL) BY MOUTH AT BEDTIME. (Patient not taking: Reported on 03/16/2016) 90 tablet 2   No current facility-administered medications on file prior to visit.    Allergies  Allergen Reactions  . Metformin And Related Other (See Comments)    Per pt gives him too much gas and refuses to take.   Social History   Social History  . Marital status: Divorced    Spouse name: N/A  . Number of children: 1  . Years of education: 8   Occupational History  . RETIRED    Social History Main Topics  . Smoking status: Current Every Day Smoker    Packs/day: 1.00    Years: 60.00    Types: Cigarettes  . Smokeless tobacco: Never Used  . Alcohol use Yes     Comment: 03/02/2016 "nothing since 1973"  . Drug use: No  . Sexual activity: No   Other Topics Concern  . Not on file   Social History Narrative   Patient is single with one child.   Patient is left handed.   Patient has an 8 th grade education.   Patient drinks up to 3 cups daily.    Review of Systems  All other systems reviewed and are negative.      Objective:   Physical Exam    Constitutional: He appears well-developed and well-nourished.  Cardiovascular: Normal rate, regular rhythm and normal heart sounds.   No murmur heard. Pulmonary/Chest: Effort normal and breath sounds normal. No respiratory distress. He has no wheezes. He has no rales. He exhibits no tenderness.  Abdominal: Soft. Bowel sounds are normal. He exhibits no distension and no mass. There is no tenderness. There is no rebound and no guarding.  Musculoskeletal: He exhibits edema.  Vitals reviewed.        Assessment & Plan:  Benign essential HTN  Pure hypercholesterolemia  Controlled type 2 diabetes mellitus without complication, without long-term current use of insulin (HCC) - Plan: Hemoglobin A1c, CBC with Differential/Platelet, COMPLETE METABOLIC PANEL WITH GFR, Lipid panel, Microalbumin, urine  Other emphysema (HCC)  Benign prostatic  hyperplasia with urinary frequency - Plan: PSA  Blood pressure is extremely high. Resume lisinopril 20 mg a day and recheck blood pressure in 2 weeks. Start Lasix 40 mg a day for peripheral edema. I believe the patient has diastolic dysfunction and possibly some right-sided heart failure secondary to emphysema and hopefully with diuresis we can help control his swelling. Check a fasting lipid panel. Goal LDL cholesterol is less than 100. Check a hemoglobin A1c. Given his advanced age, I would be willing to except a hemoglobin A1c less than 7.5. I believe the patient has a large prostate. I will check a PSA. As long as his PSA is less than 4, I will start the patient on finasteride to try to shrink his prostate. If greater than 4, we may want to get a urology consultation.

## 2016-06-05 LAB — HEMOGLOBIN A1C
Hgb A1c MFr Bld: 5.7 % — ABNORMAL HIGH (ref <5.7)
MEAN PLASMA GLUCOSE: 117 mg/dL

## 2016-06-05 LAB — MICROALBUMIN, URINE: MICROALB UR: 1.4 mg/dL

## 2016-06-05 LAB — PSA: PSA: 1.9 ng/mL (ref <=4.0)

## 2016-06-08 ENCOUNTER — Encounter: Payer: Self-pay | Admitting: Family Medicine

## 2016-06-08 ENCOUNTER — Telehealth: Payer: Self-pay | Admitting: Family Medicine

## 2016-06-08 NOTE — Telephone Encounter (Signed)
Son calling to report update on Lasix, swelling is slightly better.  BP averaging around 148/84.  Today (4/9) 162/91  Has appt 4/23.

## 2016-06-08 NOTE — Telephone Encounter (Signed)
Allow lisinopril more time, will not take full effect for 2 weeks.

## 2016-06-10 ENCOUNTER — Other Ambulatory Visit: Payer: Self-pay | Admitting: Family Medicine

## 2016-06-10 DIAGNOSIS — N400 Enlarged prostate without lower urinary tract symptoms: Secondary | ICD-10-CM

## 2016-06-21 ENCOUNTER — Ambulatory Visit (INDEPENDENT_AMBULATORY_CARE_PROVIDER_SITE_OTHER): Payer: Medicare HMO | Admitting: Family Medicine

## 2016-06-21 ENCOUNTER — Encounter: Payer: Self-pay | Admitting: Family Medicine

## 2016-06-21 VITALS — BP 162/90 | HR 74 | Temp 97.7°F | Resp 16 | Ht 71.0 in | Wt 225.0 lb

## 2016-06-21 DIAGNOSIS — I10 Essential (primary) hypertension: Secondary | ICD-10-CM

## 2016-06-21 DIAGNOSIS — M7989 Other specified soft tissue disorders: Secondary | ICD-10-CM

## 2016-06-21 LAB — BASIC METABOLIC PANEL WITH GFR
BUN: 18 mg/dL (ref 7–25)
CALCIUM: 9.3 mg/dL (ref 8.6–10.3)
CO2: 27 mmol/L (ref 20–31)
CREATININE: 0.98 mg/dL (ref 0.70–1.18)
Chloride: 105 mmol/L (ref 98–110)
GFR, Est African American: 86 mL/min (ref >=60)
GFR, Est Non African American: 74 mL/min (ref >=60)
Glucose, Bld: 139 mg/dL — ABNORMAL HIGH (ref 70–99)
Potassium: 4.4 mmol/L (ref 3.5–5.3)
SODIUM: 141 mmol/L (ref 135–146)

## 2016-06-21 MED ORDER — POTASSIUM CHLORIDE CRYS ER 20 MEQ PO TBCR: 20.0000 meq | EXTENDED_RELEASE_TABLET | Freq: Every day | ORAL | 3 refills | Status: DC

## 2016-06-21 MED ORDER — FUROSEMIDE 40 MG PO TABS: 40.0000 mg | ORAL_TABLET | Freq: Two times a day (BID) | ORAL | 3 refills | Status: DC

## 2016-06-21 NOTE — Progress Notes (Signed)
Subjective:    Patient ID: Scott Ward, male    DOB: 1941-02-12, 76 y.o.   MRN: [id number]  HPI  03/16/16 Has done remarkably well since discharge from the hospital.  He has successfully weaned off oxygen and is now 98% on room air. He discontinued his lisinopril and verapamil due to low blood pressure. This was morning for 5 days ago and his blood pressure still well controlled. He denies any tachycardia or palpitations. He does report daily diarrhea since leaving the hospital. He states it is extremely watery. Therefore we need to make sure that he has not been exposed to C. difficile. He also has significant pitting edema in both legs. He has +1 pitting edema distal to the knee. He denies any orthopnea. He denies any paroxysmal nocturnal dyspnea. However he certainly has cardiovascular risk factors including smoking, hypertension, prediabetes, and hyperlipidemia. Patient has never had an echocardiogram to evaluate for congestive heart failure he also reports polyuria. He has to rush to the bathroom. However when he gets there he has a weak urinary stream. He also reports a dribbling stream. He reports 3-4 episodes of nocturia per evening.  At that time, my plan was: Most likely a think the diarrhea is secondary to the antibiotics. I recommended taking a probiotic every day. However I will perform a GI pathogen panel to rule out C. difficile or some other nosocomial infection he may acquired in the hospital. Clinically the pneumonia has resolved. I would like to repeat a chest x-ray in 4 weeks to ensure complete resolution. At the present time the patient does not require any antihypertensives. We will simply monitor his blood pressure and resume them if necessary. His symptoms are consistent with BPH and we will start the patient on Flomax 0.4 mg by mouth daily at bedtime. I am concerned by the pitting edema in his legs. There is no evidence of fluid overload today on his physical exam the based on his  risk factors he is at high risk for congestive heart failure and therefore will check an echocardiogram.  06/04/16 Results of echo are below: - Left ventricle: The cavity size was normal. Wall thickness was   normal. Systolic function was normal. The estimated ejection   fraction was in the range of 55% to 60%. Wall motion was normal;   there were no regional wall motion abnormalities. Doppler   parameters are consistent with abnormal left ventricular   relaxation (grade 1 diastolic dysfunction). - Left atrium: The atrium was mildly dilated. - Pulmonary arteries: Systolic pressure was mildly increased. PA   peak pressure: 37 mm Hg (S).  The patient has seen no benefit from Flomax. He continues to report urinary frequency and urgency and weak urinary stream. He also reports frequent nocturia. He has significant +1 pitting edema in both legs distal to the knee. He denies orthopnea or paroxysmal nocturnal dyspnea or chest pain. His blood pressure today is significantly elevated. His fasting blood sugars are typically 120. The highest they have seen on a random blood sugar is 150. He denies any polydipsia or blurry vision. He denies any myalgias or right upper quadrant pain.  AT that time, my plan was: Blood pressure is extremely high. Resume lisinopril 20 mg a day and recheck blood pressure in 2 weeks. Start Lasix 40 mg a day for peripheral edema. I believe the patient has diastolic dysfunction and possibly some right-sided heart failure secondary to emphysema and hopefully with diuresis we can help control his  swelling. Check a fasting lipid panel. Goal LDL cholesterol is less than 100. Check a hemoglobin A1c. Given his advanced age, I would be willing to except a hemoglobin A1c less than 7.5. I believe the patient has a large prostate. I will check a PSA. As long as his PSA is less than 4, I will start the patient on finasteride to try to shrink his prostate. If greater than 4, we may want to get a  urology consultation.  06/21/16 HgA1c was 5.7 and good.  PSA was 1.9.  Blood pressure at home is 130-145/80's.  Numbers are much better.  Swelling is essentially the same and weight is essentially the same.   Past Medical History:  Diagnosis Date  . Arthritis    "lower back" (03/02/2016)  . CAP (community acquired pneumonia)   . Colon polyp   . COPD (chronic obstructive pulmonary disease) ([place]) dx'd 03/02/2016  . Dermatochalasis   . Diabetes mellitus without complication ([place])   . High blood pressure   . High cholesterol   . History of BPH   . HOH (hard of hearing)   . Nicotine abuse    Past Surgical History:  Procedure Laterality Date  . BLEPHAROPLASTY Bilateral   . CATARACT EXTRACTION W/ INTRAOCULAR LENS  IMPLANT, BILATERAL     Current Outpatient Prescriptions on File Prior to Visit  Medication Sig Dispense Refill  . aspirin 81 MG tablet Take 81 mg by mouth daily.      . finasteride (PROSCAR) 5 MG tablet Take 1 tablet (5 mg total) by mouth daily. 30 tablet 5  . ipratropium-albuterol (DUONEB) 0.5-2.5 (3) MG/3ML SOLN Take 3 mLs by nebulization every 4 (four) hours as needed. 360 mL 1  . lisinopril (PRINIVIL,ZESTRIL) 20 MG tablet Take 1 tablet (20 mg total) by mouth daily. 90 tablet 3  . simvastatin (ZOCOR) 10 MG tablet Take 1 tablet (10 mg total) by mouth at bedtime. 90 tablet 0  . tamsulosin (FLOMAX) 0.4 MG CAPS capsule TAKE ONE CAPSULE BY MOUTH DAILY 90 capsule 2   No current facility-administered medications on file prior to visit.    Allergies  Allergen Reactions  . Metformin And Related Other (See Comments)    Per pt gives him too much gas and refuses to take.   Social History   Social History  . Marital status: Divorced    Spouse name: N/A  . Number of children: 1  . Years of education: 8   Occupational History  . RETIRED    Social History Main Topics  . Smoking status: Former Smoker    Packs/day: 1.00    Years: 60.00    Types: Cigarettes    Quit date:  03/06/2016  . Smokeless tobacco: Never Used  . Alcohol use Yes     Comment: 03/02/2016 "nothing since 1973"  . Drug use: No  . Sexual activity: No   Other Topics Concern  . Not on file   Social History Narrative   Patient is single with one child.   Patient is left handed.   Patient has an 8 th grade education.   Patient drinks up to 3 cups daily.    Review of Systems  All other systems reviewed and are negative.      Objective:   Physical Exam  Constitutional: He appears well-developed and well-nourished.  Cardiovascular: Normal rate, regular rhythm and normal heart sounds.   No murmur heard. Pulmonary/Chest: Effort normal and breath sounds normal. No respiratory distress. He has no wheezes.  He has no rales. He exhibits no tenderness.  Abdominal: Soft. Bowel sounds are normal. He exhibits no distension and no mass. There is no tenderness. There is no rebound and no guarding.  Musculoskeletal: He exhibits edema.  Vitals reviewed.        Assessment & Plan:  Leg swelling - Plan: BASIC METABOLIC PANEL WITH GFR, furosemide (LASIX) 40 MG tablet, potassium chloride SA (K-DUR,KLOR-CON) 20 MEQ tablet  Benign essential HTN Blood pressure by home readings is controlled.  Up lasix to 40 bid and KDur 20 meq poqday and recheck bmp in 1 week.

## 2016-06-23 ENCOUNTER — Telehealth: Payer: Self-pay | Admitting: Family Medicine

## 2016-06-23 NOTE — Telephone Encounter (Signed)
Son called and states that the pharm was out of potassium and not able to get any in until Thursday and he wanted to know if pt needed to come back in Friday to get labs drawn or wait until he had been on the potassium for 1 week as he has not started it yet.

## 2016-06-24 NOTE — Telephone Encounter (Signed)
Pt's son aware via vm

## 2016-06-24 NOTE — Telephone Encounter (Signed)
I'd still check Friday because we upped his lasix.

## 2016-06-25 ENCOUNTER — Other Ambulatory Visit: Payer: Medicare HMO

## 2016-06-25 ENCOUNTER — Other Ambulatory Visit: Payer: Self-pay | Admitting: Family Medicine

## 2016-06-25 DIAGNOSIS — Z79899 Other long term (current) drug therapy: Secondary | ICD-10-CM

## 2016-06-25 DIAGNOSIS — N289 Disorder of kidney and ureter, unspecified: Secondary | ICD-10-CM

## 2016-06-25 LAB — BASIC METABOLIC PANEL
BUN: 20 mg/dL (ref 7–25)
CHLORIDE: 103 mmol/L (ref 98–110)
CO2: 27 mmol/L (ref 20–31)
CREATININE: 1.12 mg/dL (ref 0.70–1.18)
Calcium: 9 mg/dL (ref 8.6–10.3)
GLUCOSE: 130 mg/dL — AB (ref 70–99)
POTASSIUM: 4.3 mmol/L (ref 3.5–5.3)
Sodium: 142 mmol/L (ref 135–146)

## 2016-07-12 ENCOUNTER — Other Ambulatory Visit: Payer: Self-pay | Admitting: Family Medicine

## 2016-07-28 ENCOUNTER — Other Ambulatory Visit: Payer: Self-pay | Admitting: Family Medicine

## 2016-09-26 ENCOUNTER — Other Ambulatory Visit: Payer: Self-pay | Admitting: Family Medicine

## 2016-09-26 DIAGNOSIS — M7989 Other specified soft tissue disorders: Secondary | ICD-10-CM

## 2016-10-22 ENCOUNTER — Other Ambulatory Visit: Payer: Self-pay | Admitting: Family Medicine

## 2016-10-22 DIAGNOSIS — M7989 Other specified soft tissue disorders: Secondary | ICD-10-CM

## 2016-11-27 ENCOUNTER — Other Ambulatory Visit: Payer: Self-pay | Admitting: Family Medicine

## 2016-11-30 NOTE — Telephone Encounter (Signed)
Medication refilled per protocol. 

## 2017-01-04 DIAGNOSIS — R69 Illness, unspecified: Secondary | ICD-10-CM | POA: Diagnosis not present

## 2017-03-02 ENCOUNTER — Other Ambulatory Visit: Payer: Self-pay | Admitting: Family Medicine

## 2017-03-29 ENCOUNTER — Ambulatory Visit (INDEPENDENT_AMBULATORY_CARE_PROVIDER_SITE_OTHER): Payer: Medicare HMO | Admitting: Family Medicine

## 2017-03-29 ENCOUNTER — Encounter: Payer: Self-pay | Admitting: Family Medicine

## 2017-03-29 VITALS — BP 132/96 | HR 88 | Temp 98.0°F | Resp 16 | Ht 71.0 in | Wt 226.0 lb

## 2017-03-29 DIAGNOSIS — I10 Essential (primary) hypertension: Secondary | ICD-10-CM

## 2017-03-29 DIAGNOSIS — I739 Peripheral vascular disease, unspecified: Secondary | ICD-10-CM | POA: Diagnosis not present

## 2017-03-29 DIAGNOSIS — E7800 Pure hypercholesterolemia, unspecified: Secondary | ICD-10-CM | POA: Diagnosis not present

## 2017-03-29 DIAGNOSIS — R300 Dysuria: Secondary | ICD-10-CM | POA: Diagnosis not present

## 2017-03-29 DIAGNOSIS — E119 Type 2 diabetes mellitus without complications: Secondary | ICD-10-CM | POA: Diagnosis not present

## 2017-03-29 DIAGNOSIS — J438 Other emphysema: Secondary | ICD-10-CM | POA: Diagnosis not present

## 2017-03-29 DIAGNOSIS — Z0000 Encounter for general adult medical examination without abnormal findings: Secondary | ICD-10-CM

## 2017-03-29 LAB — COMPLETE METABOLIC PANEL WITH GFR
AG Ratio: 1.7 (calc) (ref 1.0–2.5)
ALKALINE PHOSPHATASE (APISO): 107 U/L (ref 40–115)
ALT: 23 U/L (ref 9–46)
AST: 17 U/L (ref 10–35)
Albumin: 4.2 g/dL (ref 3.6–5.1)
BUN: 22 mg/dL (ref 7–25)
CO2: 27 mmol/L (ref 20–32)
CREATININE: 1.04 mg/dL (ref 0.70–1.18)
Calcium: 9.3 mg/dL (ref 8.6–10.3)
Chloride: 106 mmol/L (ref 98–110)
GFR, Est African American: 80 mL/min/{1.73_m2} (ref >=60)
GFR, Est Non African American: 69 mL/min/{1.73_m2} (ref >=60)
GLOBULIN: 2.5 g/dL (ref 1.9–3.7)
GLUCOSE: 158 mg/dL — AB (ref 65–99)
Potassium: 4.5 mmol/L (ref 3.5–5.3)
SODIUM: 142 mmol/L (ref 135–146)
Total Bilirubin: 0.4 mg/dL (ref 0.2–1.2)
Total Protein: 6.7 g/dL (ref 6.1–8.1)

## 2017-03-29 NOTE — Progress Notes (Signed)
Subjective:    Patient ID: Scott Ward, male    DOB: 03/31/1940, 77 y.o.   MRN: [id number]  HPI Patient is here today for a checkup past medical history is significant for type 2 diabetes mellitus currently controlled only with diet, emphysema yet the patient continues to smoke, hypertension, BPH, leg swelling.  Patient's immunization records are written below: Immunization History  Administered Date(s) Administered  . Influenza Whole 12/22/2007, 12/25/2008, 01/07/2010  . Influenza, High Dose Seasonal PF 01/04/2017  . Influenza,inj,Quad PF,6+ Mos 01/18/2013, 05/17/2014, 11/22/2014, 03/03/2016  . Pneumococcal Conjugate-13 01/18/2013  . Pneumococcal Polysaccharide-23 03/10/2004, 05/20/2014  . Td 03/10/2004   Patient reports claudication with ambulation in both legs.  Muscle aches are primarily in his thighs and quadriceps.  They only occur while walking.  Pains and cramps go away when resting.  Diabetic foot exam is performed today and is significant for diminished dorsalis pedis and posterior tibialis pulses bilaterally.  However sensation is normal.  Blood pressures at home are averaging between 130 and 145/60-80.  Patient also reports dysuria, increased urinary frequency, and urinary hesitancy times 3 weeks.  He is also complaining of a lesion on his upper lip.  It is a pedunculated polyp.  It is erythematous with wart-like features.  3 mm in size on a stalk.   Past Medical History:  Diagnosis Date  . Arthritis    "lower back" (03/02/2016)  . CAP (community acquired pneumonia)   . Colon polyp   . COPD (chronic obstructive pulmonary disease) ([place]) dx'd 03/02/2016  . Dermatochalasis   . Diabetes mellitus without complication ([place])   . High blood pressure   . High cholesterol   . History of BPH   . HOH (hard of hearing)   . Nicotine abuse    Past Surgical History:  Procedure Laterality Date  . BLEPHAROPLASTY Bilateral   . CATARACT EXTRACTION W/ INTRAOCULAR LENS  IMPLANT, BILATERAL      Current Outpatient Medications on File Prior to Visit  Medication Sig Dispense Refill  . aspirin 81 MG tablet Take 81 mg by mouth daily.      . finasteride (PROSCAR) 5 MG tablet TAKE ONE TABLET BY MOUTH DAILY 90 tablet 0  . furosemide (LASIX) 40 MG tablet TAKE ONE TABLET BY MOUTH DAILY 90 tablet 2  . furosemide (LASIX) 40 MG tablet TAKE ONE TABLET BY MOUTH TWICE A DAY 180 tablet 2  . ipratropium-albuterol (DUONEB) 0.5-2.5 (3) MG/3ML SOLN Take 3 mLs by nebulization every 4 (four) hours as needed. 360 mL 1  . lisinopril (PRINIVIL,ZESTRIL) 20 MG tablet Take 1 tablet (20 mg total) by mouth daily. 90 tablet 3  . potassium chloride SA (K-DUR,KLOR-CON) 20 MEQ tablet TAKE ONE TABLET BY MOUTH DAILY 90 tablet 2  . Probiotic Product (PROBIOTIC DAILY PO) Take by mouth.    . simvastatin (ZOCOR) 10 MG tablet TAKE ONE TABLET BY MOUTH EVERY NIGHT AT BEDTIME 60 tablet 4  . tamsulosin (FLOMAX) 0.4 MG CAPS capsule TAKE ONE CAPSULE BY MOUTH DAILY 90 capsule 2   No current facility-administered medications on file prior to visit.    Allergies  Allergen Reactions  . Metformin And Related Other (See Comments)    Per pt gives him too much gas and refuses to take.   Social History   Socioeconomic History  . Marital status: Divorced    Spouse name: Not on file  . Number of children: 1  . Years of education: 8  . Highest education level: Not on  file  Social Needs  . Financial resource strain: Not on file  . Food insecurity - worry: Not on file  . Food insecurity - inability: Not on file  . Transportation needs - medical: Not on file  . Transportation needs - non-medical: Not on file  Occupational History  . Occupation: RETIRED  Tobacco Use  . Smoking status: Current Every Day Smoker    Packs/day: 1.00    Years: 60.00    Pack years: 60.00    Types: Cigarettes  . Smokeless tobacco: Never Used  Substance and Sexual Activity  . Alcohol use: Yes    Comment: 03/02/2016 "nothing since 1973"  . Drug  use: No  . Sexual activity: No  Other Topics Concern  . Not on file  Social History Narrative   Patient is single with one child.   Patient is left handed.   Patient has an 8 th grade education.   Patient drinks up to 3 cups daily.   Family History  Problem Relation Age of Onset  . Heart attack Father   . Heart attack Sister   . Lung cancer Sister       Review of Systems  All other systems reviewed and are negative.      Objective:   Physical Exam  Constitutional: He is oriented to person, place, and time. He appears well-developed and well-nourished. No distress.  HENT:  Head: Normocephalic and atraumatic.  Right Ear: External ear normal.  Left Ear: External ear normal.  Nose: Nose normal.  Mouth/Throat: Oropharynx is clear and moist. No oropharyngeal exudate.  Eyes: Conjunctivae are normal. Pupils are equal, round, and reactive to light. Right eye exhibits no discharge. Left eye exhibits no discharge. No scleral icterus.  Neck: Normal range of motion. Neck supple. No JVD present.  Cardiovascular: Normal rate, regular rhythm and normal heart sounds. Exam reveals no gallop and no friction rub.  No murmur heard. Pulmonary/Chest: Effort normal. No respiratory distress. He has wheezes. He has no rales. He exhibits no tenderness.  Abdominal: Soft. Bowel sounds are normal. He exhibits no distension and no mass. There is no tenderness. There is no rebound and no guarding.  Musculoskeletal: Normal range of motion. He exhibits no edema, tenderness or deformity.  Lymphadenopathy:    He has no cervical adenopathy.  Neurological: He is alert and oriented to person, place, and time. He has normal reflexes. He displays normal reflexes. No cranial nerve deficit. He exhibits normal muscle tone. Coordination normal.  Skin: Skin is warm. No rash noted. He is not diaphoretic. No erythema. No pallor.  Psychiatric: He has a normal mood and affect. His behavior is normal. Judgment and thought  content normal.  Vitals reviewed.         Assessment & Plan:  Benign essential HTN  Pure hypercholesterolemia  Controlled type 2 diabetes mellitus without complication, without long-term current use of insulin ([place]) - Plan: CBC with Differential/Platelet, COMPLETE METABOLIC PANEL WITH GFR, Lipid panel, Microalbumin, urine, Hemoglobin A1c  Other emphysema (HCC)  Dysuria - Plan: PSA, Urinalysis, Routine w reflex microscopic  Intermittent claudication (HCC) - Plan: VAS [place] ABI WITH/WO TBI  The patient's blood pressure today is borderline.  Continue to monitor his blood pressure at home but at the present time I will not make any changes in his antihypertensives given his advanced age.  Patient's immunizations are up-to-date.  Due to his age and comorbidities, I do not recommend colonoscopy.  I will check for prostate cancer with a PSA  particular given his symptoms.  I will also check a urinalysis.  Symptoms are concerning for a lower urinary tract infection, prostatitis versus cystitis.  Symptoms are also concerning for claudication.  Therefore I will schedule the patient for lower extremity arterial Dopplers with ABIs to evaluate for evidence of peripheral vascular disease.  I will continue to encourage the patient quit smoking.  If there is documented peripheral vascular disease, I will refer the patient to a vascular surgeon to discuss treatment options.  Check hemoglobin A1c.  Goal hemoglobin A1c is less than 7.  Check lipid panel.  Goal LDL cholesterol is less than 70.  The lesion on his upper lip was treated with liquid nitrogen cryotherapy for a total of 30 seconds.  It appears to be a verrucaform papule.  If persistent, I would recommend a shave biopsy but hopefully cryotherapy will essentially "cure" the lesion.  Urinalysis shows no evidence of urinary tract infection.  Upon further questioning, the patient states that he feels a pain whenever he has to go to the bathroom to urinate.  As  soon as he starts to urinate the pressure-like pain will improve.  I am concerned that his symptoms may be due to BPH and urinary retention.  I will await the results of his PSA.  If significantly elevated, I will consult urology to discuss other options for management of BPH.  The other option will be to switch from Flomax to Rapaflo which clinically I think works better.

## 2017-03-30 LAB — CBC WITH DIFFERENTIAL/PLATELET
BASOS ABS: 61 {cells}/uL (ref 0–200)
Basophils Relative: 0.7 %
EOS PCT: 2 %
Eosinophils Absolute: 174 cells/uL (ref 15–500)
HCT: 48 % (ref 38.5–50.0)
Hemoglobin: 16.3 g/dL (ref 13.2–17.1)
Lymphs Abs: 1444 cells/uL (ref 850–3900)
MCH: 31.3 pg (ref 27.0–33.0)
MCHC: 34 g/dL (ref 32.0–36.0)
MCV: 92.1 fL (ref 80.0–100.0)
MONOS PCT: 10.4 %
MPV: 11.2 fL (ref 7.5–12.5)
NEUTROS PCT: 70.3 %
Neutro Abs: 6116 cells/uL (ref 1500–7800)
Platelets: 205 10*3/uL (ref 140–400)
RBC: 5.21 10*6/uL (ref 4.20–5.80)
RDW: 13.8 % (ref 11.0–15.0)
Total Lymphocyte: 16.6 %
WBC mixed population: 905 cells/uL (ref 200–950)
WBC: 8.7 10*3/uL (ref 3.8–10.8)

## 2017-03-30 LAB — LIPID PANEL
CHOL/HDL RATIO: 4.2 (calc) (ref <5.0)
Cholesterol: 166 mg/dL (ref <200)
HDL: 40 mg/dL — AB (ref >40)
LDL Cholesterol (Calc): 95 mg/dL (calc)
Non-HDL Cholesterol (Calc): 126 mg/dL (calc) (ref <130)
Triglycerides: 212 mg/dL — ABNORMAL HIGH (ref <150)

## 2017-03-30 LAB — HEMOGLOBIN A1C
EAG (MMOL/L): 9.5 (calc)
HEMOGLOBIN A1C: 7.6 %{Hb} — AB (ref <5.7)
Mean Plasma Glucose: 171 (calc)

## 2017-03-30 LAB — MICROALBUMIN, URINE: Microalb, Ur: 1.1 mg/dL

## 2017-03-30 LAB — URINALYSIS, ROUTINE W REFLEX MICROSCOPIC
Bilirubin Urine: NEGATIVE
HGB URINE DIPSTICK: NEGATIVE
Ketones, ur: NEGATIVE
LEUKOCYTES UA: NEGATIVE
Nitrite: NEGATIVE
PH: 5.5 (ref 5.0–8.0)
Protein, ur: NEGATIVE
Specific Gravity, Urine: 1.025 (ref 1.001–1.03)

## 2017-03-30 LAB — PSA: PSA: 0.9 ng/mL (ref <=4.0)

## 2017-04-06 ENCOUNTER — Other Ambulatory Visit: Payer: Self-pay | Admitting: Family Medicine

## 2017-04-06 ENCOUNTER — Encounter: Payer: Self-pay | Admitting: Family Medicine

## 2017-04-06 MED ORDER — SIMVASTATIN 20 MG PO TABS: 20.0000 mg | ORAL_TABLET | Freq: Every day | ORAL | 3 refills | Status: DC

## 2017-04-06 MED ORDER — CIPROFLOXACIN HCL 250 MG PO TABS: 250.0000 mg | ORAL_TABLET | Freq: Two times a day (BID) | ORAL | 0 refills | Status: AC

## 2017-04-06 MED ORDER — SITAGLIPTIN PHOSPHATE 100 MG PO TABS: 100.0000 mg | ORAL_TABLET | Freq: Every day | ORAL | 3 refills | Status: DC

## 2017-06-01 ENCOUNTER — Other Ambulatory Visit: Payer: Self-pay | Admitting: Family Medicine

## 2017-06-29 ENCOUNTER — Other Ambulatory Visit: Payer: Self-pay | Admitting: Family Medicine

## 2017-07-04 ENCOUNTER — Other Ambulatory Visit: Payer: Self-pay | Admitting: Family Medicine

## 2017-07-28 ENCOUNTER — Other Ambulatory Visit: Payer: Self-pay | Admitting: Family Medicine

## 2017-09-28 ENCOUNTER — Other Ambulatory Visit: Payer: Self-pay | Admitting: Family Medicine

## 2017-10-08 ENCOUNTER — Other Ambulatory Visit: Payer: Self-pay | Admitting: Family Medicine

## 2017-10-08 DIAGNOSIS — M7989 Other specified soft tissue disorders: Secondary | ICD-10-CM

## 2017-11-02 ENCOUNTER — Telehealth: Payer: Self-pay | Admitting: Family Medicine

## 2017-11-02 NOTE — Telephone Encounter (Signed)
Pt's son Scott Ward called and states that his Scott Ward has tripled in price being that he is in the doughnut hole (over $300) and would like to know if there is a generic medication he could switch to ??

## 2017-11-03 MED ORDER — METFORMIN HCL 500 MG PO TABS: 1000.0000 mg | ORAL_TABLET | Freq: Two times a day (BID) | ORAL | 0 refills | Status: DC

## 2017-11-03 NOTE — Telephone Encounter (Signed)
Patient's son aware of providers recommendations and med sent to pharm

## 2017-11-03 NOTE — Telephone Encounter (Signed)
He is overdue for ov to check his diabetes (last labs 1/19).  He could switch [place] to metformin 1000 bid but he would need to wean up on that gradually to minimize diarrhea.

## 2017-11-15 ENCOUNTER — Encounter: Payer: Self-pay | Admitting: Family Medicine

## 2017-11-15 ENCOUNTER — Ambulatory Visit (INDEPENDENT_AMBULATORY_CARE_PROVIDER_SITE_OTHER): Payer: Medicare HMO | Admitting: Family Medicine

## 2017-11-15 VITALS — BP 118/82 | HR 80 | Temp 98.1°F | Resp 16 | Ht 71.0 in | Wt 222.0 lb

## 2017-11-15 DIAGNOSIS — E119 Type 2 diabetes mellitus without complications: Secondary | ICD-10-CM | POA: Diagnosis not present

## 2017-11-15 DIAGNOSIS — R0989 Other specified symptoms and signs involving the circulatory and respiratory systems: Secondary | ICD-10-CM

## 2017-11-15 DIAGNOSIS — Z23 Encounter for immunization: Secondary | ICD-10-CM | POA: Diagnosis not present

## 2017-11-15 DIAGNOSIS — I10 Essential (primary) hypertension: Secondary | ICD-10-CM | POA: Diagnosis not present

## 2017-11-15 DIAGNOSIS — Z79899 Other long term (current) drug therapy: Secondary | ICD-10-CM

## 2017-11-15 DIAGNOSIS — E7800 Pure hypercholesterolemia, unspecified: Secondary | ICD-10-CM

## 2017-11-15 NOTE — Progress Notes (Signed)
Subjective:    Patient ID: Scott Ward, male    DOB: June 01, 1940, 77 y.o.   MRN: [id number]  HPI Patient is here today for follow-up of his diabetes.  He is overdue for diabetic eye exam.  He is also due for a flu shot as well as a diabetic foot exam.  He is not checking his sugars.  In fact last time he checked his sugar was over 220.  This was 6 weeks ago.  Recently he discontinued Januvia and replaced it with metformin due to cost.  He has been on the metformin approximately 4 weeks.  He denies any diarrhea or abdominal discomfort.  He denies any polyuria, polydipsia, or blurry vision.  He denies any chest pain or shortness of breath.  Son states that he resumed smoking 1 year ago.  He is wheezing today on exam but he denies feeling short of breath or any chest tightness.  His physical exam today is significant for a right carotid bruit.  He denies any neuropathy in his feet.  He denies any numbness and tingling in his feet.  He denies any vision changes. Immunization History  Administered Date(s) Administered  . Influenza Whole 12/22/2007, 12/25/2008, 01/07/2010  . Influenza, High Dose Seasonal PF 01/04/2017  . Influenza,inj,Quad PF,6+ Mos 01/18/2013, 05/17/2014, 11/22/2014, 03/03/2016  . Pneumococcal Conjugate-13 01/18/2013  . Pneumococcal Polysaccharide-23 03/10/2004, 05/20/2014  . Td 03/10/2004    Past Medical History:  Diagnosis Date  . Arthritis    "lower back" (03/02/2016)  . CAP (community acquired pneumonia)   . Colon polyp   . COPD (chronic obstructive pulmonary disease) ([place]) dx'd 03/02/2016  . Dermatochalasis   . Diabetes mellitus without complication ([place])   . High blood pressure   . High cholesterol   . History of BPH   . HOH (hard of hearing)   . Nicotine abuse    Past Surgical History:  Procedure Laterality Date  . BLEPHAROPLASTY Bilateral   . CATARACT EXTRACTION W/ INTRAOCULAR LENS  IMPLANT, BILATERAL     Current Outpatient Medications on File Prior to Visit    Medication Sig Dispense Refill  . aspirin 81 MG tablet Take 81 mg by mouth daily.      . finasteride (PROSCAR) 5 MG tablet TAKE ONE TABLET BY MOUTH DAILY 90 tablet 0  . furosemide (LASIX) 40 MG tablet TAKE ONE TABLET BY MOUTH DAILY 90 tablet 2  . lisinopril (PRINIVIL,ZESTRIL) 20 MG tablet TAKE ONE TABLET BY MOUTH DAILY 90 tablet 2  . metFORMIN (GLUCOPHAGE) 500 MG tablet Take 2 tablets (1,000 mg total) by mouth 2 (two) times daily with a meal. 120 tablet 0  . potassium chloride SA (K-DUR,KLOR-CON) 20 MEQ tablet TAKE ONE TABLET BY MOUTH DAILY 90 tablet 1  . Probiotic Product (PROBIOTIC DAILY PO) Take by mouth.    . simvastatin (ZOCOR) 20 MG tablet Take 1 tablet (20 mg total) by mouth at bedtime. 90 tablet 3  . tamsulosin (FLOMAX) 0.4 MG CAPS capsule TAKE ONE CAPSULE BY MOUTH DAILY 90 capsule 2   No current facility-administered medications on file prior to visit.    Allergies  Allergen Reactions  . Metformin And Related Other (See Comments)    Per pt gives him too much gas and refuses to take.   Social History   Socioeconomic History  . Marital status: Divorced    Spouse name: Not on file  . Number of children: 1  . Years of education: 8  . Highest education level: Not  on file  Occupational History  . Occupation: RETIRED  Social Needs  . Financial resource strain: Not on file  . Food insecurity:    Worry: Not on file    Inability: Not on file  . Transportation needs:    Medical: Not on file    Non-medical: Not on file  Tobacco Use  . Smoking status: Current Every Day Smoker    Packs/day: 1.00    Years: 60.00    Pack years: 60.00    Types: Cigarettes  . Smokeless tobacco: Never Used  Substance and Sexual Activity  . Alcohol use: Yes    Comment: 03/02/2016 "nothing since 1973"  . Drug use: No  . Sexual activity: Never  Lifestyle  . Physical activity:    Days per week: Not on file    Minutes per session: Not on file  . Stress: Not on file  Relationships  . Social  connections:    Talks on phone: Not on file    Gets together: Not on file    Attends religious service: Not on file    Active member of club or organization: Not on file    Attends meetings of clubs or organizations: Not on file    Relationship status: Not on file  . Intimate partner violence:    Fear of current or ex partner: Not on file    Emotionally abused: Not on file    Physically abused: Not on file    Forced sexual activity: Not on file  Other Topics Concern  . Not on file  Social History Narrative   Patient is single with one child.   Patient is left handed.   Patient has an 8 th grade education.   Patient drinks up to 3 cups daily.   Family History  Problem Relation Age of Onset  . Heart attack Father   . Heart attack Sister   . Lung cancer Sister       Review of Systems  All other systems reviewed and are negative.      Objective:   Physical Exam  Constitutional: He is oriented to person, place, and time. He appears well-developed and well-nourished. No distress.  HENT:  Head: Normocephalic and atraumatic.  Right Ear: External ear normal.  Left Ear: External ear normal.  Nose: Nose normal.  Mouth/Throat: Oropharynx is clear and moist. No oropharyngeal exudate.  Eyes: Pupils are equal, round, and reactive to light. Conjunctivae are normal. Right eye exhibits no discharge. Left eye exhibits no discharge. No scleral icterus.  Neck: Normal range of motion. Neck supple. No JVD present.  Cardiovascular: Normal rate, regular rhythm and normal heart sounds. Exam reveals no gallop and no friction rub.  No murmur heard. Pulmonary/Chest: Effort normal. No respiratory distress. He has wheezes. He has no rales. He exhibits no tenderness.  Abdominal: Soft. Bowel sounds are normal. He exhibits no distension and no mass. There is no tenderness. There is no rebound and no guarding.  Musculoskeletal: Normal range of motion. He exhibits no edema, tenderness or deformity.    Lymphadenopathy:    He has no cervical adenopathy.  Neurological: He is alert and oriented to person, place, and time. He has normal reflexes. No cranial nerve deficit. He exhibits normal muscle tone. Coordination normal.  Skin: Skin is warm. No rash noted. He is not diaphoretic. No erythema. No pallor.  Psychiatric: He has a normal mood and affect. His behavior is normal. Judgment and thought content normal.  Vitals reviewed.  Assessment & Plan:  Benign essential HTN - Plan: CBC with Differential/Platelet, Comprehensive metabolic panel, Lipid panel, Hemoglobin A1c  Pure hypercholesterolemia - Plan: CBC with Differential/Platelet, Comprehensive metabolic panel, Lipid panel, Hemoglobin A1c  Controlled type 2 diabetes mellitus without complication, without long-term current use of insulin ([place]) - Plan: CBC with Differential/Platelet, Comprehensive metabolic panel, Lipid panel, Hemoglobin A1c, Ambulatory referral to Ophthalmology  Encounter for long-term (current) use of high-risk medication - Plan: CBC with Differential/Platelet, Comprehensive metabolic panel, Lipid panel, Hemoglobin A1c  Right carotid bruit - Plan: US Carotid Duplex Bilateral  I am very happy with his blood pressure.  This is well controlled and warrants no changes in his medication.  Regarding his diabetes, I will check a hemoglobin A1c.  Given the fact he is not monitoring his own sugars, I would like to keep his A1c between 7 and 7.5 to allow a little margin for safety.  If greater than 7.5, we will need to add something to the metformin to achieve this goal.  Given his age and cause being an issue Actos may be a reasonable option although I would prefer restarting Januvia.  Patient received his flu shot today.  I will check a fasting lipid panel.  Ideally I would like his LDL cholesterol below 100.  I will schedule the patient for a diabetic eye exam with ophthalmology.  Given the right carotid bruit Imus  appreciating on his physical exam, I will schedule the patient for carotid Dopplers

## 2017-11-15 NOTE — Addendum Note (Signed)
Addended by: Shary Decamp B on: 11/15/2017 09:59 AM   Modules accepted: Orders

## 2017-11-16 LAB — COMPREHENSIVE METABOLIC PANEL
AG Ratio: 1.7 (calc) (ref 1.0–2.5)
ALT: 15 U/L (ref 9–46)
AST: 14 U/L (ref 10–35)
Albumin: 4.1 g/dL (ref 3.6–5.1)
Alkaline phosphatase (APISO): 100 U/L (ref 40–115)
BUN/Creatinine Ratio: 18 (calc) (ref 6–22)
BUN: 22 mg/dL (ref 7–25)
CALCIUM: 9.6 mg/dL (ref 8.6–10.3)
CO2: 29 mmol/L (ref 20–32)
CREATININE: 1.22 mg/dL — AB (ref 0.70–1.18)
Chloride: 102 mmol/L (ref 98–110)
GLUCOSE: 166 mg/dL — AB (ref 65–99)
Globulin: 2.4 g/dL (calc) (ref 1.9–3.7)
Potassium: 4.6 mmol/L (ref 3.5–5.3)
SODIUM: 142 mmol/L (ref 135–146)
TOTAL PROTEIN: 6.5 g/dL (ref 6.1–8.1)
Total Bilirubin: 0.3 mg/dL (ref 0.2–1.2)

## 2017-11-16 LAB — CBC WITH DIFFERENTIAL/PLATELET
Basophils Absolute: 60 cells/uL (ref 0–200)
Basophils Relative: 0.7 %
EOS PCT: 2.1 %
Eosinophils Absolute: 179 cells/uL (ref 15–500)
HEMATOCRIT: 48.1 % (ref 38.5–50.0)
Hemoglobin: 16.3 g/dL (ref 13.2–17.1)
Lymphs Abs: 1522 cells/uL (ref 850–3900)
MCH: 31.1 pg (ref 27.0–33.0)
MCHC: 33.9 g/dL (ref 32.0–36.0)
MCV: 91.8 fL (ref 80.0–100.0)
MONOS PCT: 8.8 %
MPV: 11.8 fL (ref 7.5–12.5)
NEUTROS PCT: 70.5 %
Neutro Abs: 5993 cells/uL (ref 1500–7800)
PLATELETS: 186 10*3/uL (ref 140–400)
RBC: 5.24 10*6/uL (ref 4.20–5.80)
RDW: 13.3 % (ref 11.0–15.0)
TOTAL LYMPHOCYTE: 17.9 %
WBC mixed population: 748 cells/uL (ref 200–950)
WBC: 8.5 10*3/uL (ref 3.8–10.8)

## 2017-11-16 LAB — LIPID PANEL
Cholesterol: 137 mg/dL (ref <200)
HDL: 40 mg/dL — ABNORMAL LOW (ref >40)
LDL CHOLESTEROL (CALC): 74 mg/dL
Non-HDL Cholesterol (Calc): 97 mg/dL (calc) (ref <130)
TRIGLYCERIDES: 142 mg/dL (ref <150)
Total CHOL/HDL Ratio: 3.4 (calc) (ref <5.0)

## 2017-11-16 LAB — HEMOGLOBIN A1C
EAG (MMOL/L): 10 (calc)
Hgb A1c MFr Bld: 7.9 % of total Hgb — ABNORMAL HIGH (ref <5.7)
MEAN PLASMA GLUCOSE: 180 (calc)

## 2017-11-21 ENCOUNTER — Ambulatory Visit
Admission: RE | Admit: 2017-11-21 | Discharge: 2017-11-21 | Disposition: A | Discharge status: Home / Self Care | Payer: Medicare HMO | Source: Ambulatory Visit | Attending: Family Medicine | Admitting: Family Medicine

## 2017-11-21 DIAGNOSIS — R0989 Other specified symptoms and signs involving the circulatory and respiratory systems: Secondary | ICD-10-CM

## 2017-11-21 DIAGNOSIS — I6523 Occlusion and stenosis of bilateral carotid arteries: Secondary | ICD-10-CM | POA: Diagnosis not present

## 2017-11-29 ENCOUNTER — Other Ambulatory Visit: Payer: Self-pay | Admitting: Family Medicine

## 2017-12-06 DIAGNOSIS — H353131 Nonexudative age-related macular degeneration, bilateral, early dry stage: Secondary | ICD-10-CM | POA: Diagnosis not present

## 2017-12-06 DIAGNOSIS — H43811 Vitreous degeneration, right eye: Secondary | ICD-10-CM | POA: Diagnosis not present

## 2017-12-06 DIAGNOSIS — H43812 Vitreous degeneration, left eye: Secondary | ICD-10-CM | POA: Diagnosis not present

## 2017-12-06 DIAGNOSIS — E119 Type 2 diabetes mellitus without complications: Secondary | ICD-10-CM | POA: Diagnosis not present

## 2017-12-06 LAB — HM DIABETES EYE EXAM

## 2017-12-14 ENCOUNTER — Encounter: Payer: Self-pay | Admitting: *Deleted

## 2017-12-29 ENCOUNTER — Other Ambulatory Visit: Payer: Self-pay | Admitting: Family Medicine

## 2018-01-01 ENCOUNTER — Other Ambulatory Visit: Payer: Self-pay | Admitting: Family Medicine

## 2018-02-22 ENCOUNTER — Other Ambulatory Visit: Payer: Self-pay | Admitting: Family Medicine

## 2018-03-25 ENCOUNTER — Other Ambulatory Visit: Payer: Self-pay | Admitting: Family Medicine

## 2018-03-29 ENCOUNTER — Other Ambulatory Visit: Payer: Self-pay | Admitting: Family Medicine

## 2018-03-30 ENCOUNTER — Other Ambulatory Visit: Payer: Self-pay | Admitting: Family Medicine

## 2018-03-30 DIAGNOSIS — M7989 Other specified soft tissue disorders: Secondary | ICD-10-CM

## 2018-04-03 ENCOUNTER — Encounter: Payer: Medicare HMO | Admitting: Family Medicine

## 2018-04-05 ENCOUNTER — Other Ambulatory Visit: Payer: Self-pay | Admitting: Family Medicine

## 2018-04-05 DIAGNOSIS — M7989 Other specified soft tissue disorders: Secondary | ICD-10-CM

## 2018-04-25 ENCOUNTER — Other Ambulatory Visit: Payer: Self-pay | Admitting: Family Medicine

## 2018-05-19 ENCOUNTER — Other Ambulatory Visit: Payer: Self-pay | Admitting: Family Medicine

## 2018-05-23 ENCOUNTER — Encounter: Payer: Medicare HMO | Admitting: Family Medicine

## 2018-05-31 ENCOUNTER — Other Ambulatory Visit: Payer: Self-pay | Admitting: Family Medicine

## 2018-07-15 ENCOUNTER — Other Ambulatory Visit: Payer: Self-pay | Admitting: Family Medicine

## 2018-09-22 ENCOUNTER — Other Ambulatory Visit: Payer: Self-pay | Admitting: Family Medicine

## 2018-09-22 DIAGNOSIS — M7989 Other specified soft tissue disorders: Secondary | ICD-10-CM

## 2018-10-16 ENCOUNTER — Other Ambulatory Visit: Payer: Self-pay | Admitting: Family Medicine

## 2018-10-26 ENCOUNTER — Ambulatory Visit (INDEPENDENT_AMBULATORY_CARE_PROVIDER_SITE_OTHER): Payer: Medicare HMO | Admitting: *Deleted

## 2018-10-26 ENCOUNTER — Other Ambulatory Visit: Payer: Medicare HMO

## 2018-10-26 ENCOUNTER — Other Ambulatory Visit: Payer: Self-pay

## 2018-10-26 DIAGNOSIS — E119 Type 2 diabetes mellitus without complications: Secondary | ICD-10-CM | POA: Diagnosis not present

## 2018-10-26 DIAGNOSIS — Z23 Encounter for immunization: Secondary | ICD-10-CM | POA: Diagnosis not present

## 2018-10-26 DIAGNOSIS — N400 Enlarged prostate without lower urinary tract symptoms: Secondary | ICD-10-CM | POA: Diagnosis not present

## 2018-10-26 DIAGNOSIS — Z0000 Encounter for general adult medical examination without abnormal findings: Secondary | ICD-10-CM | POA: Diagnosis not present

## 2018-10-26 DIAGNOSIS — E7800 Pure hypercholesterolemia, unspecified: Secondary | ICD-10-CM | POA: Diagnosis not present

## 2018-10-26 NOTE — Progress Notes (Signed)
Patient seen in office for Influenza Vaccination.   Tolerated IM administration well.   Immunization history updated.  

## 2018-10-27 ENCOUNTER — Other Ambulatory Visit: Payer: Self-pay

## 2018-10-30 ENCOUNTER — Other Ambulatory Visit: Payer: Self-pay

## 2018-10-30 ENCOUNTER — Encounter: Payer: Self-pay | Admitting: Family Medicine

## 2018-10-30 ENCOUNTER — Ambulatory Visit (INDEPENDENT_AMBULATORY_CARE_PROVIDER_SITE_OTHER): Payer: Medicare HMO | Admitting: Family Medicine

## 2018-10-30 VITALS — BP 130/84 | HR 90 | Temp 98.4°F | Resp 16 | Ht 71.0 in | Wt 196.0 lb

## 2018-10-30 DIAGNOSIS — Z0001 Encounter for general adult medical examination with abnormal findings: Secondary | ICD-10-CM

## 2018-10-30 DIAGNOSIS — I10 Essential (primary) hypertension: Secondary | ICD-10-CM | POA: Diagnosis not present

## 2018-10-30 DIAGNOSIS — J438 Other emphysema: Secondary | ICD-10-CM

## 2018-10-30 DIAGNOSIS — E7800 Pure hypercholesterolemia, unspecified: Secondary | ICD-10-CM

## 2018-10-30 DIAGNOSIS — N289 Disorder of kidney and ureter, unspecified: Secondary | ICD-10-CM

## 2018-10-30 DIAGNOSIS — N400 Enlarged prostate without lower urinary tract symptoms: Secondary | ICD-10-CM

## 2018-10-30 DIAGNOSIS — E119 Type 2 diabetes mellitus without complications: Secondary | ICD-10-CM | POA: Diagnosis not present

## 2018-10-30 DIAGNOSIS — Z0000 Encounter for general adult medical examination without abnormal findings: Secondary | ICD-10-CM

## 2018-10-30 DIAGNOSIS — I739 Peripheral vascular disease, unspecified: Secondary | ICD-10-CM | POA: Diagnosis not present

## 2018-10-30 LAB — LIPID PANEL
Cholesterol: 142 mg/dL (ref <200)
HDL: 41 mg/dL (ref >=40)
LDL Cholesterol (Calc): 77 mg/dL (calc)
Non-HDL Cholesterol (Calc): 101 mg/dL (calc) (ref <130)
Total CHOL/HDL Ratio: 3.5 (calc) (ref <5.0)
Triglycerides: 141 mg/dL (ref <150)

## 2018-10-30 LAB — COMPREHENSIVE METABOLIC PANEL
AG Ratio: 2 (calc) (ref 1.0–2.5)
ALT: 13 U/L (ref 9–46)
AST: 14 U/L (ref 10–35)
Albumin: 4.1 g/dL (ref 3.6–5.1)
Alkaline phosphatase (APISO): 76 U/L (ref 35–144)
BUN/Creatinine Ratio: 15 (calc) (ref 6–22)
BUN: 24 mg/dL (ref 7–25)
CO2: 30 mmol/L (ref 20–32)
Calcium: 9.3 mg/dL (ref 8.6–10.3)
Chloride: 105 mmol/L (ref 98–110)
Creat: 1.59 mg/dL — ABNORMAL HIGH (ref 0.70–1.18)
Globulin: 2.1 g/dL (calc) (ref 1.9–3.7)
Glucose, Bld: 107 mg/dL — ABNORMAL HIGH (ref 65–99)
Potassium: 4.4 mmol/L (ref 3.5–5.3)
Sodium: 143 mmol/L (ref 135–146)
Total Bilirubin: 0.3 mg/dL (ref 0.2–1.2)
Total Protein: 6.2 g/dL (ref 6.1–8.1)

## 2018-10-30 LAB — CBC WITH DIFFERENTIAL/PLATELET
Absolute Monocytes: 871 cells/uL (ref 200–950)
Basophils Absolute: 62 cells/uL (ref 0–200)
Basophils Relative: 0.7 %
Eosinophils Absolute: 229 cells/uL (ref 15–500)
Eosinophils Relative: 2.6 %
HCT: 40.8 % (ref 38.5–50.0)
Hemoglobin: 13.6 g/dL (ref 13.2–17.1)
Lymphs Abs: 1681 cells/uL (ref 850–3900)
MCH: 31.3 pg (ref 27.0–33.0)
MCHC: 33.3 g/dL (ref 32.0–36.0)
MCV: 93.8 fL (ref 80.0–100.0)
MPV: 11.6 fL (ref 7.5–12.5)
Monocytes Relative: 9.9 %
Neutro Abs: 5958 cells/uL (ref 1500–7800)
Neutrophils Relative %: 67.7 %
Platelets: 224 10*3/uL (ref 140–400)
RBC: 4.35 10*6/uL (ref 4.20–5.80)
RDW: 13.2 % (ref 11.0–15.0)
Total Lymphocyte: 19.1 %
WBC: 8.8 10*3/uL (ref 3.8–10.8)

## 2018-10-30 LAB — TEST AUTHORIZATION

## 2018-10-30 LAB — PSA: PSA: 0.6 ng/mL (ref <=4.0)

## 2018-10-30 LAB — HEMOGLOBIN A1C
Hgb A1c MFr Bld: 6.1 % of total Hgb — ABNORMAL HIGH (ref <5.7)
Mean Plasma Glucose: 128 (calc)
eAG (mmol/L): 7.1 (calc)

## 2018-10-30 MED ORDER — TAMSULOSIN HCL 0.4 MG PO CAPS: 0.4000 mg | ORAL_CAPSULE | Freq: Every day | ORAL | 2 refills | Status: DC

## 2018-10-30 NOTE — Progress Notes (Signed)
Subjective:    Patient ID: Scott Ward, male    DOB: [birth year], 78 y.o.   MRN: [id number]  Medication Refill   Patient is here today for CPE and follow-up of his diabetes.  Most recent blood work as listed below: Lab on 10/26/2018  Component Date Value Ref Range Status  . Hgb A1c MFr Bld 10/26/2018 6.1* <5.7 % of total Hgb Final   Comment: For someone without known diabetes, a hemoglobin  A1c value between 5.7% and 6.4% is consistent with prediabetes and should be confirmed with a  follow-up test. . For someone with known diabetes, a value <7% indicates that their diabetes is well controlled. A1c targets should be individualized based on duration of diabetes, age, comorbid conditions, and other considerations. . This assay result is consistent with an increased risk of diabetes. . Currently, no consensus exists regarding use of hemoglobin A1c for diagnosis of diabetes for children. .   . Mean Plasma Glucose 10/26/2018 128  (calc) Final  . eAG (mmol/L) 10/26/2018 7.1  (calc) Final  . Cholesterol 10/26/2018 142  <200 mg/dL Final  . HDL 10/26/2018 41  > OR = 40 mg/dL Final  . Triglycerides 10/26/2018 141  <150 mg/dL Final  . LDL Cholesterol (Calc) 10/26/2018 77  mg/dL (calc) Final   Comment: Reference range: <100 . Desirable range <100 mg/dL for primary prevention;   <70 mg/dL for patients with CHD or diabetic patients  with > or = 2 CHD risk factors. Marland Kitchen LDL-C is now calculated using the Martin-Hopkins  calculation, which is a validated novel method providing  better accuracy than the Friedewald equation in the  estimation of LDL-C.  Cresenciano Genre et al. Annamaria Helling. [id number]): 2061-2068  (http://education.[facility].com/faq/FAQ164)   . Total CHOL/HDL Ratio 10/26/2018 3.5  <5.0 (calc) Final  . Non-HDL Cholesterol (Calc) 10/26/2018 101  <130 mg/dL (calc) Final   Comment: For patients with diabetes plus 1 major ASCVD risk  factor, treating to a non-HDL-C goal of <100  mg/dL  (LDL-C of <70 mg/dL) is considered a therapeutic  option.   . Glucose, Bld 10/26/2018 107* 65 - 99 mg/dL Final   Comment: .            Fasting reference interval . For someone without known diabetes, a glucose value between 100 and 125 mg/dL is consistent with prediabetes and should be confirmed with a follow-up test. .   . BUN 10/26/2018 24  7 - 25 mg/dL Final  . Creat 10/26/2018 1.59* 0.70 - 1.18 mg/dL Final   Comment: For patients >16 years of age, the reference limit for Creatinine is approximately 13% higher for people identified as African-American. .   Havery Moros Ratio 10/26/2018 15  6 - 22 (calc) Final  . Sodium 10/26/2018 143  135 - 146 mmol/L Final  . Potassium 10/26/2018 4.4  3.5 - 5.3 mmol/L Final  . Chloride 10/26/2018 105  98 - 110 mmol/L Final  . CO2 10/26/2018 30  20 - 32 mmol/L Final  . Calcium 10/26/2018 9.3  8.6 - 10.3 mg/dL Final  . Total Protein 10/26/2018 6.2  6.1 - 8.1 g/dL Final  . Albumin 10/26/2018 4.1  3.6 - 5.1 g/dL Final  . Globulin 10/26/2018 2.1  1.9 - 3.7 g/dL (calc) Final  . AG Ratio 10/26/2018 2.0  1.0 - 2.5 (calc) Final  . Total Bilirubin 10/26/2018 0.3  0.2 - 1.2 mg/dL Final  . Alkaline phosphatase (APISO) 10/26/2018 76  35 - 144 U/L Final  . AST  10/26/2018 14  10 - 35 U/L Final  . ALT 10/26/2018 13  9 - 46 U/L Final  . WBC 10/26/2018 8.8  3.8 - 10.8 Thousand/uL Final  . RBC 10/26/2018 4.35  4.20 - 5.80 Million/uL Final  . Hemoglobin 10/26/2018 13.6  13.2 - 17.1 g/dL Final  . HCT 10/26/2018 40.8  38.5 - 50.0 % Final  . MCV 10/26/2018 93.8  80.0 - 100.0 fL Final  . MCH 10/26/2018 31.3  27.0 - 33.0 pg Final  . MCHC 10/26/2018 33.3  32.0 - 36.0 g/dL Final  . RDW 10/26/2018 13.2  11.0 - 15.0 % Final  . Platelets 10/26/2018 224  140 - 400 Thousand/uL Final  . MPV 10/26/2018 11.6  7.5 - 12.5 fL Final  . Neutro Abs 10/26/2018 5,958  1,500 - 7,800 cells/uL Final  . Lymphs Abs 10/26/2018 1,681  850 - 3,900 cells/uL Final  .  Absolute Monocytes 10/26/2018 871  200 - 950 cells/uL Final  . Eosinophils Absolute 10/26/2018 229  15 - 500 cells/uL Final  . Basophils Absolute 10/26/2018 62  0 - 200 cells/uL Final  . Neutrophils Relative % 10/26/2018 67.7  % Final  . Total Lymphocyte 10/26/2018 19.1  % Final  . Monocytes Relative 10/26/2018 9.9  % Final  . Eosinophils Relative 10/26/2018 2.6  % Final  . Basophils Relative 10/26/2018 0.7  % Final   Cholesterol and hemoglobin A1c is excellent however but there has been a subtle rise in his renal function.  The patient is taking Lasix which raises the concern about possible dehydration however he states that he is drinking plenty of water and on his exam today he does not appear dehydrated.  However he is having significant issues with lower urinary tract symptoms.  He reports weak dribbling stream.  He reports inability to empty his bladder.  He wakes up frequently at night having urge incontinence unable to make it to the bathroom in time.  He is taking finasteride however somehow he stopped his Flomax and we are not certain as to how.  He denies any chest pain.  He denies any shortness of breath.  He is audibly wheezing today on exam however he denies any dyspnea on exertion.  He does have a right carotid bruit however ultrasound last year showed insignificant plaque.  However on his review of systems, the patient does report claudication in both legs right greater than left.  The pain is primarily in his posterior right thigh.  However, the patient has excellent pulses at his femoral artery, popliteal artery.  I lose his pulse at his dorsalis pedis and his capillary refill is very sluggish in his feet.  He has normal sensation to 10 g monofilament and his pain does not sound neuropathic in nature Immunization History  Administered Date(s) Administered  . Fluad Quad(high Dose 65+) 10/26/2018  . Influenza Whole 12/22/2007, 12/25/2008, 01/07/2010  . Influenza, High Dose Seasonal PF  01/04/2017  . Influenza,inj,Quad PF,6+ Mos 01/18/2013, 05/17/2014, 11/22/2014, 03/03/2016, 11/15/2017  . Pneumococcal Conjugate-13 01/18/2013  . Pneumococcal Polysaccharide-23 03/10/2004, 05/20/2014  . Td 03/10/2004    Past Medical History:  Diagnosis Date  . Arthritis    "lower back" (03/02/2016)  . CAP (community acquired pneumonia)   . Colon polyp   . COPD (chronic obstructive pulmonary disease) ([place]) dx'd 03/02/2016  . Dermatochalasis   . Diabetes mellitus without complication ([place])   . High blood pressure   . High cholesterol   . History of BPH   .  HOH (hard of hearing)   . Nicotine abuse    Past Surgical History:  Procedure Laterality Date  . BLEPHAROPLASTY Bilateral   . CATARACT EXTRACTION W/ INTRAOCULAR LENS  IMPLANT, BILATERAL     Current Outpatient Medications on File Prior to Visit  Medication Sig Dispense Refill  . aspirin 81 MG tablet Take 81 mg by mouth daily.      . finasteride (PROSCAR) 5 MG tablet TAKE ONE TABLET BY MOUTH DAILY 90 tablet 0  . furosemide (LASIX) 40 MG tablet TAKE ONE TABLET BY MOUTH TWICE A DAY 180 tablet 2  . lisinopril (PRINIVIL,ZESTRIL) 20 MG tablet TAKE ONE TABLET BY MOUTH DAILY 90 tablet 2  . metFORMIN (GLUCOPHAGE) 500 MG tablet TAKE TWO TABLETS BY MOUTH TWICE A DAY WITH MEALS 360 tablet 1  . potassium chloride SA (K-DUR) 20 MEQ tablet TAKE ONE TABLET BY MOUTH DAILY 90 tablet 0  . Probiotic Product (PROBIOTIC DAILY PO) Take by mouth.    . simvastatin (ZOCOR) 20 MG tablet TAKE ONE TABLET BY MOUTH EVERY NIGHT AT BEDTIME 90 tablet 2  . tamsulosin (FLOMAX) 0.4 MG CAPS capsule TAKE ONE CAPSULE BY MOUTH DAILY 90 capsule 2   No current facility-administered medications on file prior to visit.    Allergies  Allergen Reactions  . Metformin And Related Other (See Comments)    Per pt gives him too much gas and refuses to take.   Social History   Socioeconomic History  . Marital status: Divorced    Spouse name: Not on file  . Number of  children: 1  . Years of education: 8  . Highest education level: Not on file  Occupational History  . Occupation: RETIRED  Social Needs  . Financial resource strain: Not on file  . Food insecurity    Worry: Not on file    Inability: Not on file  . Transportation needs    Medical: Not on file    Non-medical: Not on file  Tobacco Use  . Smoking status: Current Every Day Smoker    Packs/day: 1.00    Years: 60.00    Pack years: 60.00    Types: Cigarettes  . Smokeless tobacco: Never Used  Substance and Sexual Activity  . Alcohol use: Yes    Comment: 03/02/2016 "nothing since 1973"  . Drug use: No  . Sexual activity: Never  Lifestyle  . Physical activity    Days per week: Not on file    Minutes per session: Not on file  . Stress: Not on file  Relationships  . Social Herbalist on phone: Not on file    Gets together: Not on file    Attends religious service: Not on file    Active member of club or organization: Not on file    Attends meetings of clubs or organizations: Not on file    Relationship status: Not on file  . Intimate partner violence    Fear of current or ex partner: Not on file    Emotionally abused: Not on file    Physically abused: Not on file    Forced sexual activity: Not on file  Other Topics Concern  . Not on file  Social History Narrative   Patient is single with one child.   Patient is left handed.   Patient has an 8 th grade education.   Patient drinks up to 3 cups daily.   Family History  Problem Relation Age of Onset  . Heart attack  Father   . Heart attack Sister   . Lung cancer Sister       Review of Systems  All other systems reviewed and are negative.      Objective:   Physical Exam  Constitutional: He is oriented to person, place, and time. He appears well-developed and well-nourished. No distress.  HENT:  Head: Normocephalic and atraumatic.  Right Ear: External ear normal.  Left Ear: External ear normal.  Nose: Nose  normal.  Mouth/Throat: Oropharynx is clear and moist. No oropharyngeal exudate.  Eyes: Pupils are equal, round, and reactive to light. Conjunctivae are normal. Right eye exhibits no discharge. Left eye exhibits no discharge. No scleral icterus.  Neck: Normal range of motion. Neck supple. No JVD present.  Cardiovascular: Normal rate, regular rhythm and normal heart sounds. Exam reveals no gallop and no friction rub.  No murmur heard. Pulmonary/Chest: Effort normal. No respiratory distress. He has wheezes. He has no rales. He exhibits no tenderness.  Abdominal: Soft. Bowel sounds are normal. He exhibits no distension and no mass. There is no abdominal tenderness. There is no rebound and no guarding.  Musculoskeletal: Normal range of motion.        General: No tenderness, deformity or edema.  Lymphadenopathy:    He has no cervical adenopathy.  Neurological: He is alert and oriented to person, place, and time. He has normal reflexes. No cranial nerve deficit. He exhibits normal muscle tone. Coordination normal.  Skin: Skin is warm. No rash noted. He is not diaphoretic. No erythema. No pallor.  Psychiatric: He has a normal mood and affect. His behavior is normal. Judgment and thought content normal.  Vitals reviewed.         Assessment & Plan:   General medical exam  Controlled type 2 diabetes mellitus without complication, without long-term current use of insulin ([facility])  Benign essential HTN  Pure hypercholesterolemia  Other emphysema ([place])   Patient's immunizations are up-to-date.  Given his lower urinary tract symptoms, I will check a PSA and if significantly elevated consult urology to rule out cancer as a potential cause of his obstructive symptoms however I suspect the patient has severe BPH.  Prostate is enlarged today on rectal exam and I did not palpate any nodularity.  I have recommended starting Flomax 0.4 mg p.o. nightly and if no better over the next 2 to 3 weeks consult  urology unless PSA is significantly elevated at which point I would consult them urgently.  I will obtain a renal ultrasound but I suspect the patient may have obstructive nephropathy.  However given his age and smoking I would like to obtain ultrasound to rule out malignancy.  Blood pressure, cholesterol, and diabetes are well controlled.  Strongly recommended smoking cessation.  M concerned the patient is having claudication causing the pain in his legs and therefore I will schedule the patient for arterial ultrasound of both legs with ABIs.

## 2018-11-01 ENCOUNTER — Ambulatory Visit
Admission: RE | Admit: 2018-11-01 | Discharge: 2018-11-01 | Disposition: A | Discharge status: Home / Self Care | Payer: Medicare HMO | Source: Ambulatory Visit | Attending: Family Medicine | Admitting: Family Medicine

## 2018-11-01 DIAGNOSIS — N289 Disorder of kidney and ureter, unspecified: Secondary | ICD-10-CM

## 2018-11-02 ENCOUNTER — Ambulatory Visit (HOSPITAL_COMMUNITY)
Admission: RE | Admit: 2018-11-02 | Discharge: 2018-11-02 | Disposition: A | Discharge status: Home / Self Care | Payer: Medicare HMO | Source: Ambulatory Visit | Attending: Family Medicine | Admitting: Family Medicine

## 2018-11-02 ENCOUNTER — Other Ambulatory Visit: Payer: Self-pay

## 2018-11-02 DIAGNOSIS — I739 Peripheral vascular disease, unspecified: Secondary | ICD-10-CM

## 2018-11-25 ENCOUNTER — Other Ambulatory Visit: Payer: Self-pay | Admitting: Family Medicine

## 2018-11-29 ENCOUNTER — Other Ambulatory Visit: Payer: Self-pay | Admitting: Family Medicine

## 2018-12-02 ENCOUNTER — Encounter: Payer: Self-pay | Admitting: Family Medicine

## 2018-12-02 DIAGNOSIS — N400 Enlarged prostate without lower urinary tract symptoms: Secondary | ICD-10-CM

## 2018-12-19 ENCOUNTER — Other Ambulatory Visit: Payer: Self-pay | Admitting: Family Medicine

## 2018-12-19 DIAGNOSIS — M7989 Other specified soft tissue disorders: Secondary | ICD-10-CM

## 2018-12-21 ENCOUNTER — Other Ambulatory Visit: Payer: Self-pay | Admitting: Family Medicine

## 2019-01-08 DIAGNOSIS — N401 Enlarged prostate with lower urinary tract symptoms: Secondary | ICD-10-CM | POA: Diagnosis not present

## 2019-01-08 DIAGNOSIS — R3914 Feeling of incomplete bladder emptying: Secondary | ICD-10-CM | POA: Diagnosis not present

## 2019-01-08 DIAGNOSIS — N183 Chronic kidney disease, stage 3 unspecified: Secondary | ICD-10-CM | POA: Diagnosis not present

## 2019-01-15 DIAGNOSIS — N183 Chronic kidney disease, stage 3 unspecified: Secondary | ICD-10-CM | POA: Diagnosis not present

## 2019-01-15 DIAGNOSIS — R390 Extravasation of urine: Secondary | ICD-10-CM | POA: Diagnosis not present

## 2019-01-15 DIAGNOSIS — R3914 Feeling of incomplete bladder emptying: Secondary | ICD-10-CM | POA: Diagnosis not present

## 2019-01-31 ENCOUNTER — Other Ambulatory Visit: Payer: Self-pay | Admitting: Urology

## 2019-01-31 DIAGNOSIS — N401 Enlarged prostate with lower urinary tract symptoms: Secondary | ICD-10-CM | POA: Diagnosis not present

## 2019-01-31 DIAGNOSIS — R338 Other retention of urine: Secondary | ICD-10-CM | POA: Diagnosis not present

## 2019-02-01 ENCOUNTER — Encounter (HOSPITAL_COMMUNITY): Payer: Self-pay

## 2019-02-01 NOTE — Patient Instructions (Addendum)
DUE TO COVID-19 ONLY ONE VISITOR IS ALLOWED TO COME WITH YOU AND STAY IN THE WAITING ROOM ONLY DURING PRE OP AND PROCEDURE. THE ONE VISITOR MAY VISIT WITH YOU IN YOUR PRIVATE ROOM DURING VISITING HOURS ONLY!!   COVID SWAB TESTING MUST BE COMPLETED ON: Today, Immediately after pre op appointment     [address] [place] [facility] (Must self quarantine after testing. Follow instructions on handout.)         Your procedure is scheduled on: Tuesday, Dec. 8, 2020   Report to [facility] Main  Entrance    Report to admitting at 7:45 AM   Call this number if you have problems the morning of surgery [phone]   Do not eat food or drink liquids :After Midnight.   Brush your teeth the morning of surgery.   Do NOT smoke after Midnight   Take these medicines the morning of surgery with A SIP OF WATER: Finasteride  DO NOT TAKE ANY DIABETIC MEDICATIONS DAY OF YOUR SURGERY                               You may not have any metal on your body including jewelry, and body piercings             Do not wear lotions, powders, perfumes/cologne, or deodorant                          Men may shave face and neck.   Do not bring valuables to the hospital. [place].   Contacts, dentures or bridgework may not be worn into surgery.   Bring small overnight bag day of surgery.    Special Instructions: Bring a copy of your healthcare power of attorney and living will documents         the day of surgery if you haven't scanned them in before.              Please read over the following fact sheets you were given:  How to Manage Your Diabetes Before and After Surgery  Why is it important to control my blood sugar before and after surgery? . Improving blood sugar levels before and after surgery helps healing and can limit problems. . A way of improving blood sugar control is eating a  healthy diet by: o  Eating less sugar and carbohydrates o  Increasing activity/exercise o  Talking with your doctor about reaching your blood sugar goals . High blood sugars (greater than 180 mg/dL) can raise your risk of infections and slow your recovery, so you will need to focus on controlling your diabetes during the weeks before surgery. . Make sure that the doctor who takes care of your diabetes knows about your planned surgery including the date and location.  How do I manage my blood sugar before surgery? . Check your blood sugar at least 4 times a day, starting 2 days before surgery, to make sure that the level is not too high or low. o Check your blood sugar the morning of your surgery when you wake up and every 2 hours until you get to the Short Stay unit. . If your blood sugar is less than 70 mg/dL, you will need to  treat for low blood sugar: o Do not take insulin. o Treat a low blood sugar (less than 70 mg/dL) with  cup of clear juice (cranberry or apple), 4 glucose tablets, OR glucose gel. o Recheck blood sugar in 15 minutes after treatment (to make sure it is greater than 70 mg/dL). If your blood sugar is not greater than 70 mg/dL on recheck, call [phone] for further instructions. . Report your blood sugar to the short stay nurse when you get to Short Stay.  . If you are admitted to the hospital after surgery: o Your blood sugar will be checked by the staff and you will probably be given insulin after surgery (instead of oral diabetes medicines) to make sure you have good blood sugar levels. o The goal for blood sugar control after surgery is 80-180 mg/dL.   WHAT DO I DO ABOUT MY DIABETES MEDICATION?  Marland Kitchen Do not take oral diabetes medicines (pills) the morning of surgery.  . THE DAY BEFORE SURGERY take Metformin per normal routine    [facility] - Preparing for Surgery Before surgery, you can play an important role.  Because skin is not sterile, your skin needs to be as  free of germs as possible.  You can reduce the number of germs on your skin by washing with CHG (chlorahexidine gluconate) soap before surgery.  CHG is an antiseptic cleaner which kills germs and bonds with the skin to continue killing germs even after washing. Please DO NOT use if you have an allergy to CHG or antibacterial soaps.  If your skin becomes reddened/irritated stop using the CHG and inform your nurse when you arrive at Short Stay. Do not shave (including legs and underarms) for at least 48 hours prior to the first CHG shower.  You may shave your face/neck.  Please follow these instructions carefully:  1.  Shower with CHG Soap the night before surgery and the  morning of surgery.  2.  If you choose to wash your hair, wash your hair first as usual with your normal  shampoo.  3.  After you shampoo, rinse your hair and body thoroughly to remove the shampoo.                             4.  Use CHG as you would any other liquid soap.  You can apply chg directly to the skin and wash.  Gently with a scrungie or clean washcloth.  5.  Apply the CHG Soap to your body ONLY FROM THE NECK DOWN.   Do   not use on face/ open                           Wound or open sores. Avoid contact with eyes, ears mouth and   genitals (private parts).                       Wash face,  Genitals (private parts) with your normal soap.             6.  Wash thoroughly, paying special attention to the area where your    surgery  will be performed.  7.  Thoroughly rinse your body with warm water from the neck down.  8.  DO NOT shower/wash with your normal soap after using and rinsing off the CHG Soap.  9.  Pat yourself dry with a clean towel.            10.  Wear clean pajamas.            11.  Place clean sheets on your bed the night of your first shower and do not  sleep with pets. Day of Surgery : Do not apply any lotions/deodorants the morning of surgery.  Please wear clean clothes to the hospital/surgery  center.  FAILURE TO FOLLOW THESE INSTRUCTIONS MAY RESULT IN THE CANCELLATION OF YOUR SURGERY  PATIENT SIGNATURE_________________________________  NURSE SIGNATURE__________________________________  ________________________________________________________________________

## 2019-02-02 ENCOUNTER — Other Ambulatory Visit (HOSPITAL_COMMUNITY)
Admission: RE | Admit: 2019-02-02 | Discharge: 2019-02-02 | Disposition: A | Discharge status: Home / Self Care | Payer: Medicare HMO | Source: Ambulatory Visit | Attending: Urology | Admitting: Urology

## 2019-02-02 ENCOUNTER — Other Ambulatory Visit: Payer: Self-pay

## 2019-02-02 ENCOUNTER — Encounter (HOSPITAL_COMMUNITY): Payer: Self-pay

## 2019-02-02 ENCOUNTER — Encounter (HOSPITAL_COMMUNITY)
Admission: RE | Admit: 2019-02-02 | Discharge: 2019-02-02 | Disposition: A | Discharge status: Home / Self Care | Payer: Medicare HMO | Source: Ambulatory Visit | Attending: Urology | Admitting: Urology

## 2019-02-02 DIAGNOSIS — Z01810 Encounter for preprocedural cardiovascular examination: Secondary | ICD-10-CM | POA: Insufficient documentation

## 2019-02-02 DIAGNOSIS — D291 Benign neoplasm of prostate: Secondary | ICD-10-CM | POA: Insufficient documentation

## 2019-02-02 DIAGNOSIS — Z20828 Contact with and (suspected) exposure to other viral communicable diseases: Secondary | ICD-10-CM | POA: Insufficient documentation

## 2019-02-02 DIAGNOSIS — Z01812 Encounter for preprocedural laboratory examination: Secondary | ICD-10-CM | POA: Diagnosis not present

## 2019-02-02 DIAGNOSIS — I491 Atrial premature depolarization: Secondary | ICD-10-CM | POA: Insufficient documentation

## 2019-02-02 HISTORY — DX: Personal history of colon polyps, unspecified: Z86.0100

## 2019-02-02 HISTORY — DX: Fatty (change of) liver, not elsewhere classified: K76.0

## 2019-02-02 HISTORY — DX: Personal history of colonic polyps: Z86.010

## 2019-02-02 HISTORY — DX: Other specified symptoms and signs involving the circulatory and respiratory systems: R09.89

## 2019-02-02 LAB — CBC
HCT: 40.9 % (ref 39.0–52.0)
Hemoglobin: 13.5 g/dL (ref 13.0–17.0)
MCH: 31.3 pg (ref 26.0–34.0)
MCHC: 33 g/dL (ref 30.0–36.0)
MCV: 94.9 fL (ref 80.0–100.0)
Platelets: 230 10*3/uL (ref 150–400)
RBC: 4.31 MIL/uL (ref 4.22–5.81)
RDW: 12.9 % (ref 11.5–15.5)
WBC: 8.6 10*3/uL (ref 4.0–10.5)
nRBC: 0 % (ref 0.0–0.2)

## 2019-02-02 LAB — BASIC METABOLIC PANEL
Anion gap: 10 (ref 5–15)
BUN: 37 mg/dL — ABNORMAL HIGH (ref 8–23)
CO2: 29 mmol/L (ref 22–32)
Calcium: 9.4 mg/dL (ref 8.9–10.3)
Chloride: 103 mmol/L (ref 98–111)
Creatinine, Ser: 1.89 mg/dL — ABNORMAL HIGH (ref 0.61–1.24)
GFR calc Af Amer: 38 mL/min — ABNORMAL LOW (ref >60)
GFR calc non Af Amer: 33 mL/min — ABNORMAL LOW (ref >60)
Glucose, Bld: 181 mg/dL — ABNORMAL HIGH (ref 70–99)
Potassium: 3.9 mmol/L (ref 3.5–5.1)
Sodium: 142 mmol/L (ref 135–145)

## 2019-02-02 LAB — HEMOGLOBIN A1C
Hgb A1c MFr Bld: 6.8 % — ABNORMAL HIGH (ref 4.8–5.6)
Mean Plasma Glucose: 148.46 mg/dL

## 2019-02-02 LAB — GLUCOSE, CAPILLARY: Glucose-Capillary: 252 mg/dL — ABNORMAL HIGH (ref 70–99)

## 2019-02-02 NOTE — Progress Notes (Signed)
PCP - Dr. Raford Pitcher Last office visit 10/30/2018 in epic Cardiologist - N/A  Chest x-ray - greater than 2 years EKG - 02/02/2019 in epic Stress Test - N/A ECHO - greater than 2 years Cardiac Cath - N/A  Sleep Study - N/A CPAP - N/A  Fasting Blood Sugar - 110-120 Checks Blood Sugar  Once a month  Blood Thinner Instructions: N/A Aspirin Instructions: Last Dose: 02/01/2019 per pts son  Anesthesia review: COPD, DM, HTN  Patient denies shortness of breath, fever, cough and chest pain at PAT appointment   Patient verbalized understanding of instructions that were given to them at the PAT appointment. Patient was also instructed that they will need to review over the PAT instructions again at home before surgery.

## 2019-02-03 LAB — NOVEL CORONAVIRUS, NAA (HOSP ORDER, SEND-OUT TO REF LAB; TAT 18-24 HRS): SARS-CoV-2, NAA: NOT DETECTED

## 2019-02-05 ENCOUNTER — Other Ambulatory Visit: Payer: Self-pay | Admitting: Family Medicine

## 2019-02-05 DIAGNOSIS — N289 Disorder of kidney and ureter, unspecified: Secondary | ICD-10-CM

## 2019-02-05 NOTE — Progress Notes (Signed)
Anesthesia Chart Review   Case: [id number] Date/Time: 02/06/19 0930   Procedure: TRANSURETHRAL RESECTION OF THE PROSTATE (TURP) (N/A )   Anesthesia type: General   Pre-op diagnosis: BENIGN PROSTATE HYPERPLASIA   Location: [place] / [facility] ORS   Surgeon: Irine Seal, MD      DISCUSSION:78 y.o. current every day smoker (60 pack years) with h/o DM II, COPD, BPH scheduled for above procedure 02/06/2019 with Dr. Irine Seal.   Elevated creatinine 1.89, followed by PCP.  Per note 02/05/2019 Dr. Dennard Schaumann advised pt to hold Lasix with recheck of labs in 1 week.  VS: BP 122/70   Pulse 77   Temp 36.6 C (Oral)   Resp 17   Ht 5\' 8"  (1.727 m)   Wt 87.2 kg   SpO2 97%   BMI 29.22 kg/m   PROVIDERS: Susy Frizzle, MD is PCP last seen 10/30/2018   LABS: Labs reviewed: Acceptable for surgery. (all labs ordered are listed, but only abnormal results are displayed)  Labs Reviewed  GLUCOSE, CAPILLARY - Abnormal; Notable for the following components:      Result Value   Glucose-Capillary 252 (*)    All other components within normal limits  BASIC METABOLIC PANEL - Abnormal; Notable for the following components:   Glucose, Bld 181 (*)    BUN 37 (*)    Creatinine, Ser 1.89 (*)    GFR calc non Af Amer 33 (*)    GFR calc Af Amer 38 (*)    All other components within normal limits  HEMOGLOBIN A1C - Abnormal; Notable for the following components:   Hgb A1c MFr Bld 6.8 (*)    All other components within normal limits  CBC     IMAGES: US Carotid 11/21/2017 IMPRESSION: Color duplex indicates minimal heterogeneous and calcified plaque, with no hemodynamically significant stenosis by duplex criteria in the extracranial cerebrovascular circulation.  EKG: 02/02/2019 Rate 73 bpm Sinus rhythm with Premature atrial complexes Otherwise normal ECG Premature atrial complexes New since previous tracing   CV: Echo 03/26/2016 Study Conclusions  - Left ventricle: The cavity size was normal.  Wall thickness was   normal. Systolic function was normal. The estimated ejection   fraction was in the range of 55% to 60%. Wall motion was normal;   there were no regional wall motion abnormalities. Doppler   parameters are consistent with abnormal left ventricular   relaxation (grade 1 diastolic dysfunction). - Left atrium: The atrium was mildly dilated. - Pulmonary arteries: Systolic pressure was mildly increased. PA   peak pressure: 37 mm Hg (S). Past Medical History:  Diagnosis Date  . Arthritis    "lower back" (03/02/2016)  . CAP (community acquired pneumonia) 2018   Right Middle Lobe  . Colon polyp   . COPD (chronic obstructive pulmonary disease) ([place]) dx'd 03/02/2016  . Dermatochalasis   . Diabetes mellitus without complication ([place])   . Fatty liver   . High blood pressure   . High cholesterol   . History of BPH   . History of colon polyps   . HOH (hard of hearing)   . Nicotine abuse   . Right carotid bruit     Past Surgical History:  Procedure Laterality Date  . BLEPHAROPLASTY Bilateral   . CATARACT EXTRACTION W/ INTRAOCULAR LENS  IMPLANT, BILATERAL    . COLONOSCOPY      MEDICATIONS: . aspirin 81 MG tablet  . finasteride (PROSCAR) 5 MG tablet  . furosemide (LASIX) 40 MG tablet  .  KLOR-CON M20 20 MEQ tablet  . lisinopril (ZESTRIL) 20 MG tablet  . metFORMIN (GLUCOPHAGE) 500 MG tablet  . simvastatin (ZOCOR) 20 MG tablet  . tamsulosin (FLOMAX) 0.4 MG CAPS capsule   No current facility-administered medications for this encounter.     Maia Plan [facility] Pre-Surgical Testing [phone] 02/05/19  10:30 AM

## 2019-02-05 NOTE — H&P (Signed)
CC/HPI: Frequency, Nocturia and Urgency     Scott Ward returns today in f/u from Urodynamics on 01/15/19. He was able to generate a contraction but couldn't void and the foley was replaced. He was found to have a UTI and was treated for that. He is to have cystoscopy today.   UDS results: Scott Ward held a max capacity of approx. 650 mls. His 1st sensation was felt at 482 mls. He was having an unstable contraction at the time and went straight to a strong desire. He tried to void off this contraction but was unable to. Once the contraction settled down, filling was continued, and he was asked to attempt a voluntary void. He was able to generate a voluntary contraction but was unable to void. Trabeculation and elevation of the bladder base was noted. A small right-sided diverticulum was also noted. No reflux was seen. An 18 fr foley was inserted post UDS.    GU Hx: Scott Ward is a 78 yo WM who is sent by Dr. Dennard Schaumann for a 5 yr history of frequency, urgency, nocturia and a reduced stream. He doesn't feel he empties his bladder completely. Has frequency q1hr with urgency with some UUI. He has nocturia x 3 and can be wet when he wakes. He has had no hematuria. He has no dysuria. He has had no prior GU history. He is recent PSA was 0.6 with the prior high being 1.35. He has been tried on tamsulosin without success. He is also on finasteride for the last 6 weeks. He has no GI symptoms.    ALLERGIES: None   MEDICATIONS: Finasteride 5 mg tablet  Lisinopril 20 mg tablet  Metformin Hcl 500 mg tablet  Simvastatin 20 mg tablet  Aspirin Ec 81 mg tablet, delayed release  Furosemide 40 mg tablet  Potassium Chloride 20 meq packet     GU PSH: Complex cystometrogram, w/ void pressure and urethral pressure profile studies, any technique - 01/15/2019 Complex Uroflow - 01/15/2019 Emg surf Electrd - 01/15/2019 Inject For cystogram - 01/15/2019 Intrabd voidng Press - 01/15/2019     NON-GU PSH: None   GU PMH:  BPH w/LUTS, He has a PVR of 1569ml and symptoms suggestive overflow. He also has progressive CKD. I am going to get him set up for urodynamics and will repeat a BMP that day. He will then return for cystoscopy. He will be sent out with a foley today. - 01/08/2019 Chronic kidney disease stage 3 (GFR 30-60), BMP next week. - 01/08/2019 Incomplete bladder emptying - 01/08/2019    NON-GU PMH: Arthritis Coronary Artery Disease Diabetes Type 2 GERD Hypercholesterolemia Hypertension Hyperthyroidism    FAMILY HISTORY: brain aneurysm - Mother Diabetes - Sister Heart Attack - Father   SOCIAL HISTORY: Marital Status: Single Preferred Language: English; Race: White Current Smoking Status: Patient smokes.   Tobacco Use Assessment Completed: Used Tobacco in last 30 days? Drinks 4+ caffeinated drinks per day.     Notes: 1 son    REVIEW OF SYSTEMS:    GU Review Male:   Patient denies frequent urination, hard to postpone urination, burning/ pain with urination, get up at night to urinate, leakage of urine, stream starts and stops, trouble starting your stream, have to strain to urinate , erection problems, and penile pain.  Gastrointestinal (Upper):   Patient denies nausea, vomiting, and indigestion/ heartburn.  Gastrointestinal (Lower):   Patient denies diarrhea and constipation.  Constitutional:   Patient denies fever, night sweats, weight loss, and fatigue.  Skin:  Patient denies skin rash/ lesion and itching.  Eyes:   Patient denies blurred vision and double vision.  Ears/ Nose/ Throat:   Patient denies sore throat and sinus problems.  Hematologic/Lymphatic:   Patient denies swollen glands and easy bruising.  Cardiovascular:   Patient denies leg swelling and chest pains.  Respiratory:   Patient denies cough and shortness of breath.  Endocrine:   Patient denies excessive thirst.  Musculoskeletal:   Patient denies back pain and joint pain.  Neurological:   Patient denies dizziness and  headaches.  Psychologic:   Patient denies depression and anxiety.   VITAL SIGNS:      01/31/2019 10:30 AM  BP 136/77 mmHg  Pulse 97 /min  Temperature 98.0 F / 36.6 C   MULTI-SYSTEM PHYSICAL EXAMINATION:    Constitutional: Well-nourished. No physical deformities. Normally developed. Good grooming.  Respiratory: Scattered Wheezes. No labored breathing, no use of accessory muscles.   Cardiovascular: Regular rate and rhythm. No murmur, no gallop.      PAST DATA REVIEWED:  Source Of History:  Patient  Urodynamics Review:   Review Urodynamics Tests   PROCEDURES:         Flexible Cystoscopy - 52000  Risks, benefits, and some of the potential complications of the procedure were discussed. 73ml of 2% lidocaine jelly was instilled intraurethrally.  Cipro 500mg  given for antibiotic prophylaxis.     Meatus:  Normal size. Normal location. Normal condition.  Urethra:  No strictures.  External Sphincter:  Normal.  Verumontanum:  Normal.  Prostate:  Obstructing. Moderate hyperplasia.  Bladder Neck:  Non-obstructing.  Ureteral Orifices:  Normal location. Normal size. Normal shape. Effluxed clear urine.  Bladder:  Moderate trabeculation. Erythematous mucosa with edema on the posterior wall from the catheter. . No tumors. No stones.      The procedure was well tolerated and there were no complications.        Simple Foley Catheterization - [id number]  A 16 French Foley catheter was inserted into the bladder using sterile technique. The patient was taught routine catheter care. Hand irrigation of the bladder with sterile water was performed. A leg bag was connected. 350 cc of urine was obtained.   ASSESSMENT:      ICD-10 Details  1 GU:   BPH w/LUTS - N40.1 Stable - He failed his voiding trial today but has BPH with BOO with an adequate detrusor contraction on Urodynamics. I discussed continued medical management with another voiding trial in a month, TURP, Rezum and Urolift and have recommended he  consider a TURP for the highest probability of becoming catheter free. He is agreeable to that and will get scheduled. I reviewd the risks of a TURP including bleeding, infection, incontinence, stricture, need for secondary procedures, ejaculatory and erectile dysfunction, thrombotic events, fluid overload and anesthetic complications. I explained that 95% of men will have relief of the obstructive symptoms and about 70% will have relief of the irritative symptoms.   2   Urinary Retention - R33.8 Stable   PLAN:           Schedule Return Visit/Planned Activity: ASAP - Schedule Surgery             Note: TURP          Document Letter(s):  Created for Patient: Clinical Summary

## 2019-02-06 ENCOUNTER — Encounter (HOSPITAL_COMMUNITY): Payer: Self-pay | Admitting: Anesthesiology

## 2019-02-06 ENCOUNTER — Ambulatory Visit (HOSPITAL_COMMUNITY): Payer: Medicare HMO | Admitting: Certified Registered Nurse Anesthetist

## 2019-02-06 ENCOUNTER — Encounter (HOSPITAL_COMMUNITY)
Admission: RE | Disposition: A | Discharge status: Home / Self Care | Payer: Self-pay | Source: Home / Self Care | Attending: Urology

## 2019-02-06 ENCOUNTER — Observation Stay (HOSPITAL_COMMUNITY)
Admission: RE | Admit: 2019-02-06 | Discharge: 2019-02-07 | Disposition: A | Discharge status: Home / Self Care | Payer: Medicare HMO | Attending: Urology | Admitting: Urology

## 2019-02-06 ENCOUNTER — Ambulatory Visit (HOSPITAL_COMMUNITY): Payer: Medicare HMO | Admitting: Physician Assistant

## 2019-02-06 ENCOUNTER — Other Ambulatory Visit: Payer: Self-pay

## 2019-02-06 DIAGNOSIS — F17200 Nicotine dependence, unspecified, uncomplicated: Secondary | ICD-10-CM | POA: Diagnosis not present

## 2019-02-06 DIAGNOSIS — E1122 Type 2 diabetes mellitus with diabetic chronic kidney disease: Secondary | ICD-10-CM | POA: Insufficient documentation

## 2019-02-06 DIAGNOSIS — I129 Hypertensive chronic kidney disease with stage 1 through stage 4 chronic kidney disease, or unspecified chronic kidney disease: Secondary | ICD-10-CM | POA: Insufficient documentation

## 2019-02-06 DIAGNOSIS — Z7984 Long term (current) use of oral hypoglycemic drugs: Secondary | ICD-10-CM | POA: Diagnosis not present

## 2019-02-06 DIAGNOSIS — Z7982 Long term (current) use of aspirin: Secondary | ICD-10-CM | POA: Diagnosis not present

## 2019-02-06 DIAGNOSIS — M47817 Spondylosis without myelopathy or radiculopathy, lumbosacral region: Secondary | ICD-10-CM | POA: Insufficient documentation

## 2019-02-06 DIAGNOSIS — N138 Other obstructive and reflux uropathy: Secondary | ICD-10-CM | POA: Diagnosis present

## 2019-02-06 DIAGNOSIS — I2510 Atherosclerotic heart disease of native coronary artery without angina pectoris: Secondary | ICD-10-CM | POA: Diagnosis not present

## 2019-02-06 DIAGNOSIS — R338 Other retention of urine: Secondary | ICD-10-CM | POA: Diagnosis not present

## 2019-02-06 DIAGNOSIS — N401 Enlarged prostate with lower urinary tract symptoms: Secondary | ICD-10-CM | POA: Diagnosis not present

## 2019-02-06 DIAGNOSIS — N320 Bladder-neck obstruction: Secondary | ICD-10-CM | POA: Diagnosis not present

## 2019-02-06 DIAGNOSIS — Z79899 Other long term (current) drug therapy: Secondary | ICD-10-CM | POA: Insufficient documentation

## 2019-02-06 DIAGNOSIS — Z888 Allergy status to other drugs, medicaments and biological substances status: Secondary | ICD-10-CM | POA: Insufficient documentation

## 2019-02-06 DIAGNOSIS — N400 Enlarged prostate without lower urinary tract symptoms: Secondary | ICD-10-CM | POA: Diagnosis not present

## 2019-02-06 DIAGNOSIS — E7800 Pure hypercholesterolemia, unspecified: Secondary | ICD-10-CM | POA: Diagnosis not present

## 2019-02-06 DIAGNOSIS — J449 Chronic obstructive pulmonary disease, unspecified: Secondary | ICD-10-CM | POA: Diagnosis not present

## 2019-02-06 DIAGNOSIS — E119 Type 2 diabetes mellitus without complications: Secondary | ICD-10-CM | POA: Diagnosis not present

## 2019-02-06 DIAGNOSIS — N183 Chronic kidney disease, stage 3 unspecified: Secondary | ICD-10-CM | POA: Diagnosis not present

## 2019-02-06 HISTORY — PX: TRANSURETHRAL RESECTION OF PROSTATE: SHX73

## 2019-02-06 LAB — GLUCOSE, CAPILLARY
Glucose-Capillary: 105 mg/dL — ABNORMAL HIGH (ref 70–99)
Glucose-Capillary: 103 mg/dL — ABNORMAL HIGH (ref 70–99)
Glucose-Capillary: 248 mg/dL — ABNORMAL HIGH (ref 70–99)
Glucose-Capillary: 238 mg/dL — ABNORMAL HIGH (ref 70–99)
Glucose-Capillary: 178 mg/dL — ABNORMAL HIGH (ref 70–99)

## 2019-02-06 SURGERY — TURP (TRANSURETHRAL RESECTION OF PROSTATE)
Anesthesia: General | Site: Urethra

## 2019-02-06 MED ORDER — EPHEDRINE 5 MG/ML INJ
INTRAVENOUS | Status: AC
  Filled 2019-02-06: qty 10

## 2019-02-06 MED ORDER — LISINOPRIL 20 MG PO TABS
20.0000 mg | ORAL_TABLET | Freq: Every day | ORAL | Status: DC
  Administered 2019-02-06 – 2019-02-07 (×2): 20 mg via ORAL
  Filled 2019-02-06 (×2): qty 1

## 2019-02-06 MED ORDER — FENTANYL CITRATE (PF) 100 MCG/2ML IJ SOLN
INTRAMUSCULAR | Status: DC | PRN
  Administered 2019-02-06 (×2): 25 ug via INTRAVENOUS
  Administered 2019-02-06: 50 ug via INTRAVENOUS

## 2019-02-06 MED ORDER — PROPOFOL 10 MG/ML IV BOLUS
INTRAVENOUS | Status: AC
  Filled 2019-02-06: qty 20

## 2019-02-06 MED ORDER — ACETAMINOPHEN 325 MG PO TABS: 650.0000 mg | ORAL_TABLET | ORAL | Status: DC | PRN

## 2019-02-06 MED ORDER — PHENYLEPHRINE 40 MCG/ML (10ML) SYRINGE FOR IV PUSH (FOR BLOOD PRESSURE SUPPORT)
PREFILLED_SYRINGE | INTRAVENOUS | Status: DC | PRN
  Administered 2019-02-06: 120 ug via INTRAVENOUS

## 2019-02-06 MED ORDER — SODIUM CHLORIDE 0.9 % IR SOLN
Status: DC | PRN
  Administered 2019-02-06: 6000 mL via INTRAVESICAL
  Administered 2019-02-06: 3000 mL via INTRAVESICAL
  Administered 2019-02-06 (×2): 6000 mL via INTRAVESICAL

## 2019-02-06 MED ORDER — DEXAMETHASONE SODIUM PHOSPHATE 10 MG/ML IJ SOLN
INTRAMUSCULAR | Status: AC
  Filled 2019-02-06: qty 1

## 2019-02-06 MED ORDER — LACTATED RINGERS IV SOLN
INTRAVENOUS | Status: DC
  Administered 2019-02-06: 08:00:00 via INTRAVENOUS

## 2019-02-06 MED ORDER — METFORMIN HCL 500 MG PO TABS
500.0000 mg | ORAL_TABLET | Freq: Two times a day (BID) | ORAL | Status: DC
  Administered 2019-02-06 – 2019-02-07 (×2): 500 mg via ORAL
  Filled 2019-02-06 (×2): qty 1

## 2019-02-06 MED ORDER — LIDOCAINE 2% (20 MG/ML) 5 ML SYRINGE
INTRAMUSCULAR | Status: AC
  Filled 2019-02-06: qty 5

## 2019-02-06 MED ORDER — FENTANYL CITRATE (PF) 100 MCG/2ML IJ SOLN
INTRAMUSCULAR | Status: AC
  Filled 2019-02-06: qty 4

## 2019-02-06 MED ORDER — FLEET ENEMA 7-19 GM/118ML RE ENEM: 1.0000 | ENEMA | Freq: Once | RECTAL | Status: DC | PRN

## 2019-02-06 MED ORDER — FUROSEMIDE 40 MG PO TABS
40.0000 mg | ORAL_TABLET | Freq: Two times a day (BID) | ORAL | Status: DC
  Administered 2019-02-06 – 2019-02-07 (×2): 40 mg via ORAL
  Filled 2019-02-06 (×2): qty 1

## 2019-02-06 MED ORDER — DEXAMETHASONE SODIUM PHOSPHATE 4 MG/ML IJ SOLN
INTRAMUSCULAR | Status: DC | PRN
  Administered 2019-02-06: 5 mg via INTRAVENOUS

## 2019-02-06 MED ORDER — FENTANYL CITRATE (PF) 100 MCG/2ML IJ SOLN
INTRAMUSCULAR | Status: AC
  Filled 2019-02-06: qty 2

## 2019-02-06 MED ORDER — ONDANSETRON HCL 4 MG/2ML IJ SOLN
INTRAMUSCULAR | Status: AC
  Filled 2019-02-06: qty 2

## 2019-02-06 MED ORDER — HYDROMORPHONE HCL 1 MG/ML IJ SOLN: 0.5000 mg | INTRAMUSCULAR | Status: DC | PRN

## 2019-02-06 MED ORDER — PHENYLEPHRINE 40 MCG/ML (10ML) SYRINGE FOR IV PUSH (FOR BLOOD PRESSURE SUPPORT)
PREFILLED_SYRINGE | INTRAVENOUS | Status: AC
  Filled 2019-02-06: qty 10

## 2019-02-06 MED ORDER — POTASSIUM CHLORIDE CRYS ER 20 MEQ PO TBCR
20.0000 meq | EXTENDED_RELEASE_TABLET | Freq: Every day | ORAL | Status: DC
  Administered 2019-02-06 – 2019-02-07 (×2): 20 meq via ORAL
  Filled 2019-02-06 (×2): qty 1

## 2019-02-06 MED ORDER — FENTANYL CITRATE (PF) 100 MCG/2ML IJ SOLN
25.0000 ug | INTRAMUSCULAR | Status: DC | PRN
  Administered 2019-02-06 (×3): 50 ug via INTRAVENOUS

## 2019-02-06 MED ORDER — SIMVASTATIN 20 MG PO TABS
20.0000 mg | ORAL_TABLET | Freq: Every day | ORAL | Status: DC
  Administered 2019-02-06: 20 mg via ORAL
  Filled 2019-02-06: qty 1

## 2019-02-06 MED ORDER — ONDANSETRON HCL 4 MG/2ML IJ SOLN: 4.0000 mg | INTRAMUSCULAR | Status: DC | PRN

## 2019-02-06 MED ORDER — BISACODYL 10 MG RE SUPP: 10.0000 mg | Freq: Every day | RECTAL | Status: DC | PRN

## 2019-02-06 MED ORDER — SODIUM CHLORIDE 0.9 % IV SOLN
2.0000 g | INTRAVENOUS | Status: AC
  Administered 2019-02-06: 2 g via INTRAVENOUS
  Filled 2019-02-06: qty 20

## 2019-02-06 MED ORDER — ONDANSETRON HCL 4 MG/2ML IJ SOLN
INTRAMUSCULAR | Status: DC | PRN
  Administered 2019-02-06: 4 mg via INTRAVENOUS

## 2019-02-06 MED ORDER — POTASSIUM CHLORIDE IN NACL 20-0.45 MEQ/L-% IV SOLN
INTRAVENOUS | Status: DC
  Administered 2019-02-06 – 2019-02-07 (×2): via INTRAVENOUS
  Filled 2019-02-06 (×3): qty 1000

## 2019-02-06 MED ORDER — SENNOSIDES-DOCUSATE SODIUM 8.6-50 MG PO TABS: 1.0000 | ORAL_TABLET | Freq: Every evening | ORAL | Status: DC | PRN

## 2019-02-06 MED ORDER — SODIUM CHLORIDE 0.9 % IV SOLN
1.0000 g | INTRAVENOUS | Status: DC
  Administered 2019-02-07: 1 g via INTRAVENOUS
  Filled 2019-02-06: qty 1

## 2019-02-06 MED ORDER — LIDOCAINE 2% (20 MG/ML) 5 ML SYRINGE
INTRAMUSCULAR | Status: DC | PRN
  Administered 2019-02-06: 60 mg via INTRAVENOUS

## 2019-02-06 MED ORDER — INSULIN ASPART 100 UNIT/ML ~~LOC~~ SOLN
0.0000 [IU] | Freq: Three times a day (TID) | SUBCUTANEOUS | Status: DC
  Administered 2019-02-06: 16:00:00 8 [IU] via SUBCUTANEOUS

## 2019-02-06 MED ORDER — HYDROCODONE-ACETAMINOPHEN 5-325 MG PO TABS
1.0000 | ORAL_TABLET | ORAL | Status: DC | PRN
  Administered 2019-02-06 (×3): 2 via ORAL
  Filled 2019-02-06 (×3): qty 2

## 2019-02-06 MED ORDER — PROPOFOL 10 MG/ML IV BOLUS
INTRAVENOUS | Status: DC | PRN
  Administered 2019-02-06: 100 mg via INTRAVENOUS

## 2019-02-06 SURGICAL SUPPLY — 17 items
BAG DRN RND TRDRP ANRFLXCHMBR (UROLOGICAL SUPPLIES) ×1
BAG URINE DRAIN 2000ML AR STRL (UROLOGICAL SUPPLIES) ×1 IMPLANT
BAG URO CATCHER STRL LF (MISCELLANEOUS) ×2 IMPLANT
CATH FOLEY 3WAY 30CC 22FR (CATHETERS) ×1 IMPLANT
ELECT REM PT RETURN 15FT ADLT (MISCELLANEOUS) ×2 IMPLANT
GLOVE SURG SS PI 8.0 STRL IVOR (GLOVE) ×1 IMPLANT
GOWN STRL REUS W/TWL XL LVL3 (GOWN DISPOSABLE) ×2 IMPLANT
HOLDER FOLEY CATH W/STRAP (MISCELLANEOUS) ×1 IMPLANT
KIT TURNOVER KIT A (KITS) ×1 IMPLANT
LOOP CUT BIPOLAR 24F LRG (ELECTROSURGICAL) ×1 IMPLANT
MANIFOLD NEPTUNE II (INSTRUMENTS) ×2 IMPLANT
PACK CYSTO (CUSTOM PROCEDURE TRAY) ×2 IMPLANT
PENCIL SMOKE EVACUATOR (MISCELLANEOUS) IMPLANT
SYR 30ML LL (SYRINGE) ×1 IMPLANT
SYRINGE IRR TOOMEY STRL 70CC (SYRINGE) ×1 IMPLANT
TUBING CONNECTING 10 (TUBING) ×2 IMPLANT
TUBING UROLOGY SET (TUBING) ×2 IMPLANT

## 2019-02-06 NOTE — Anesthesia Preprocedure Evaluation (Addendum)
Anesthesia Evaluation  Patient identified by MRN, date of birth, ID band Patient awake    Reviewed: Allergy & Precautions, NPO status , Patient's Chart, lab work & pertinent test results  Airway Mallampati: II  TM Distance: >3 FB Neck ROM: Full    Dental  (+) Edentulous Upper, Edentulous Lower   Pulmonary COPD, Current Smoker and Patient abstained from smoking.,    breath sounds clear to auscultation       Cardiovascular hypertension, Pt. on medications  Rhythm:Regular Rate:Normal     Neuro/Psych negative neurological ROS     GI/Hepatic negative GI ROS, Neg liver ROS,   Endo/Other  diabetes, Type 2, Oral Hypoglycemic Agents  Renal/GU negative Renal ROS     Musculoskeletal  (+) Arthritis ,   Abdominal Normal abdominal exam  (+)   Peds  Hematology negative hematology ROS (+)   Anesthesia Other Findings   Reproductive/Obstetrics                            Anesthesia Physical Anesthesia Plan  ASA: III  Anesthesia Plan: General   Post-op Pain Management:    Induction: Intravenous  PONV Risk Score and Plan: 2 and Ondansetron, Dexamethasone and Treatment may vary due to age or medical condition  Airway Management Planned: LMA  Additional Equipment: None  Intra-op Plan:   Post-operative Plan: Extubation in OR  Informed Consent: I have reviewed the patients History and Physical, chart, labs and discussed the procedure including the risks, benefits and alternatives for the proposed anesthesia with the patient or authorized representative who has indicated his/her understanding and acceptance.     Dental advisory given  Plan Discussed with: CRNA  Anesthesia Plan Comments:        Anesthesia Quick Evaluation

## 2019-02-06 NOTE — Anesthesia Procedure Notes (Signed)
Procedure Name: LMA Insertion Date/Time: 02/06/2019 9:53 AM Performed by: Claudia Desanctis, CRNA Pre-anesthesia Checklist: Emergency Drugs available, Patient identified, Suction available and Patient being monitored Patient Re-evaluated:Patient Re-evaluated prior to induction Oxygen Delivery Method: Circle system utilized Preoxygenation: Pre-oxygenation with 100% oxygen Induction Type: IV induction LMA: LMA inserted LMA Size: 5.0 Number of attempts: 1 Placement Confirmation: positive ETCO2 and breath sounds checked- equal and bilateral Tube secured with: Tape Dental Injury: Teeth and Oropharynx as per pre-operative assessment  Comments: Used 4 lma first with too big of a leak to maintain, switched to 5 without problem

## 2019-02-06 NOTE — Progress Notes (Signed)
Patient ID: HALLET OBENOUR, male   DOB: 1940/08/07, 78 y.o.   MRN: [id number]   Post-op note  Subjective: The patient is doing well.  No complaints.  Denies N/V.   Objective: Vital signs in last 24 hours: Temp:  [97.9 F (36.6 C)-98.3 F (36.8 C)] 98.3 F (36.8 C) (12/08 1245) Pulse Rate:  [53-75] 55 (12/08 1345) Resp:  [9-24] 12 (12/08 1345) BP: (110-144)/(54-91) 135/71 (12/08 1353) SpO2:  [91 %-100 %] 94 % (12/08 1345)  Intake/Output from previous day: No intake/output data recorded. Intake/Output this shift: Total I/O In: 1650 [I.V.:500; Other:1050; IV Piggyback:100] Out: 1500 [Urine:1450; Blood:50]  Physical Exam:  General: Alert and oriented. Abdomen: Soft, Nondistended. Urine: pink with slow drip CBI  Lab Results: No results for input(s): HGB, HCT in the last 72 hours.  Assessment/Plan: POD#0   1) Continue to monitor; doing well s/p TURP  2) DVT prophy, amb  3) pain control  4) IS  5) Adv diet as tolerated  6) Continue CBI   LOS: 0 days   Debbrah Alar 02/06/2019, 2:48 PM

## 2019-02-06 NOTE — Op Note (Signed)
Preoperative diagnosis: 1. Bladder outlet obstruction secondary to BPH  Postoperative diagnosis:  1. Bladder outlet obstruction secondary to BPH  Procedure:  1. Cystoscopy 2. Transurethral Resection of the prostate  Surgeon: Irine Seal. M.D.  Anesthesia: general  Complications: None  EBL: 253ml   Specimens: 1. Prostate chips  Disposition of specimens: Pathology  Indication: Scott Ward is a patient with bladder outlet obstruction with retention secondary to benign prostatic hyperplasia. After reviewing the management options for treatment, he elected to proceed with the above surgical procedure(s). We have discussed the potential benefits and risks of the procedure, side effects of the proposed treatment, the likelihood of the patient achieving the goals of the procedure, and any potential problems that might occur during the procedure or recuperation. Informed consent has been obtained.  Description of procedure:  The patient was taken to the operating room and a general  anesthesia was induced.  The patient was placed in the dorsal lithotomy position, prepped and draped in the usual sterile fashion, and preoperative antibiotics were administered. A preoperative time-out was performed.   Cystourethroscopy was performed.  The patient's urethra was examined and demonstrated bilobar prostatic hypertrophy with a small middle lobe.   The bladder was then systematically examined in its entirety. There was severe trabeculation with no evidence of any bladder tumors or stones but there was inflammatory edema of the posterior wall from the foley.  The ureteral orifices were identified and marked so as to be avoided during the procedure.  The prostate adenoma was then resected utilizing loop cautery resection with the bipolar cutting loop.  The prostate adenoma from the bladder neck back to the verumontanum was resected beginning at the six o'clock position and then extended to include  the right and left lobes of the prostate and anterior prostate. Care was taken not to resect distal to the verumontanum.  At the completion of the procedure the bladder was evacuated free of chips and hemostasis was insured.  Final inspection revealed intact ureteral orifices, a widely patent TUR channel and an intact external sphincter.   Hemostasis was then achieved with the cautery and the bladder was emptied and reinspected with no significant bleeding noted at the end of the procedure.    A 55fr 3 way catheter was then placed into the bladder and placed on continuous bladder irrigation.  The patient appeared to tolerate the procedure well and without complications.  The patient was able to be awakened and transferred to the recovery unit in satisfactory condition.

## 2019-02-06 NOTE — Discharge Instructions (Signed)
Transurethral Resection of the Prostate, Care After °This sheet gives you information about how to care for yourself after your procedure. Your health care provider may also give you more specific instructions. If you have problems or questions, contact your health care provider. °What can I expect after the procedure? °After the procedure, it is common to have: °· Mild pain in your lower abdomen. °· Soreness or mild discomfort in your penis from having the catheter inserted during the procedure. °· A feeling of urgency when you need to urinate. °· A small amount of blood in your urine. You may notice some small blood clots in your urine. These are normal. °Follow these instructions at home: °Medicines °· Take over-the-counter and prescription medicines only as told by your health care provider. °· If you were prescribed an antibiotic medicine, take it as told by your health care provider. Do not stop taking the antibiotic even if you start to feel better. °· Ask your health care provider if the medicine prescribed to you: °? Requires you to avoid driving or using heavy machinery. °? Can cause constipation. You may need to take actions to prevent or treat constipation, such as: °§ Take over-the-counter or prescription medicines. °§ Eat foods that are high in fiber, such as fresh fruits and vegetables, whole grains, and beans. °§ Limit foods that are high in fat and processed sugars, such as fried or sweet foods. °· Do not drive for 24 hours if you were given a sedative during your procedure. °Activity ° °· Return to your normal activities as told by your health care provider. Ask your health care provider what activities are safe for you. °· Do not lift anything that is heavier than 10 lb (4.5 kg), or the limit that you are told, for 3 weeks after the procedure or until your health care provider says that it is safe. °· Avoid intense physical activity for as long as told by your health care provider. °· Avoid  sitting for a long time without moving. Get up and move around one or more times every few hours. This helps to prevent blood clots. You may increase your physical activity gradually as you start to feel better. °Lifestyle °· Do not drink alcohol for as long as told by your health care provider. This is especially important if you are taking prescription pain medicines. °· Do not engage in sexual activity until your health care provider says that you can do this. °General instructions ° °· Do not take baths, swim, or use a hot tub until your health care provider approves. °· Drink enough fluid to keep your urine pale yellow. °· Urinate as soon as you feel the need to. Do not try to hold your urine for long periods of time. °· If your health care provider approves, you may take a stool softener for 2-3 weeks to prevent you from straining to have a bowel movement. °· Wear compression stockings as told by your health care provider. These stockings help to prevent blood clots and reduce swelling in your legs. °· Keep all follow-up visits as told by your health care provider. This is important. °Contact a health care provider if you have: °· Difficulty urinating. °· A fever. °· Pain that gets worse or does not improve with medicine. °· Blood in your urine that does not go away after 1 week of resting and drinking more fluids. °· Swelling in your penis or testicles. °Get help right away if: °· You are unable   to urinate.  You are having more blood clots in your urine instead of fewer.  You have: ? Large blood clots. ? A lot of blood in your urine. ? Pain in your back or lower abdomen. ? Pain or swelling in your legs. ? Chills and you are shaking. ? Difficulty breathing or shortness of breath. Summary  After the procedure, it is common to have a small amount of blood in your urine.  Avoid heavy lifting and intense physical activity for as long as told by your health care provider.  Urinate as soon as you  feel the need to. Do not try to hold your urine for long periods of time.  Keep all follow-up visits as told by your health care provider. This is important.  Resume aspirin in 1 week.   This information is not intended to replace advice given to you by your health care provider. Make sure you discuss any questions you have with your health care provider. Document Released: 02/15/2005 Document Revised: 06/07/2018 Document Reviewed: 11/16/2017 Elsevier Patient Education  2020 [employer].

## 2019-02-06 NOTE — Transfer of Care (Signed)
Immediate Anesthesia Transfer of Care Note  Patient: Scott Ward  Procedure(s) Performed: TRANSURETHRAL RESECTION OF THE PROSTATE (TURP) (N/A Urethra)  Patient Location: PACU  Anesthesia Type:General  Level of Consciousness: awake and patient cooperative  Airway & Oxygen Therapy: Patient Spontanous Breathing and Patient connected to face mask  Post-op Assessment: Report given to RN and Post -op Vital signs reviewed and stable  Post vital signs: Reviewed and stable  Last Vitals:  Vitals Value Taken Time  BP 126/67 02/06/19 1056  Temp 36.6 C 02/06/19 1056  Pulse 70 02/06/19 1057  Resp 30 02/06/19 1057  SpO2 94 % 02/06/19 1057  Vitals shown include unvalidated device data.  Last Pain:  Vitals:   02/06/19 0812  TempSrc: Oral  PainSc: 0-No pain         Complications: No apparent anesthesia complications

## 2019-02-06 NOTE — Interval H&P Note (Signed)
No change in history.

## 2019-02-07 ENCOUNTER — Encounter (HOSPITAL_COMMUNITY): Payer: Self-pay | Admitting: Urology

## 2019-02-07 DIAGNOSIS — N401 Enlarged prostate with lower urinary tract symptoms: Secondary | ICD-10-CM | POA: Diagnosis not present

## 2019-02-07 LAB — SURGICAL PATHOLOGY

## 2019-02-07 LAB — GLUCOSE, CAPILLARY
Glucose-Capillary: 105 mg/dL — ABNORMAL HIGH (ref 70–99)
Glucose-Capillary: 100 mg/dL — ABNORMAL HIGH (ref 70–99)

## 2019-02-07 NOTE — Anesthesia Postprocedure Evaluation (Signed)
Anesthesia Post Note  Patient: Scott Ward  Procedure(s) Performed: TRANSURETHRAL RESECTION OF THE PROSTATE (TURP) (N/A Urethra)     Patient location during evaluation: PACU Anesthesia Type: General Level of consciousness: awake and alert Pain management: pain level controlled Vital Signs Assessment: post-procedure vital signs reviewed and stable Respiratory status: spontaneous breathing, nonlabored ventilation, respiratory function stable and patient connected to nasal cannula oxygen Cardiovascular status: blood pressure returned to baseline and stable Postop Assessment: no apparent nausea or vomiting Anesthetic complications: no    Last Vitals:  Vitals:   02/06/19 2025 02/07/19 0442  BP: (!) 103/55 124/64  Pulse: 60 (!) 57  Resp: 18 18  Temp: 36.5 C 36.6 C  SpO2: 93% 95%    Last Pain:  Vitals:   02/07/19 0813  TempSrc:   PainSc: 0-No pain   Pain Goal: Patients Stated Pain Goal: 3 (02/06/19 1859)                 Effie Berkshire

## 2019-02-07 NOTE — Progress Notes (Signed)
1 Day Post-Op  Subjective: POD 1 from TURP.  No complaints.  Foley out 3 hours but no void yet.  He doesn't have the urge.  ROS:  Review of Systems  All other systems reviewed and are negative.   Anti-infectives: Anti-infectives (From admission, onward)   Start     Dose/Rate Route Frequency Ordered Stop   02/07/19 0900  cefTRIAXone (ROCEPHIN) 1 g in sodium chloride 0.9 % 100 mL IVPB     1 g 200 mL/hr over 30 Minutes Intravenous Every 24 hours 02/06/19 1427     02/06/19 0757  cefTRIAXone (ROCEPHIN) 2 g in sodium chloride 0.9 % 100 mL IVPB     2 g 200 mL/hr over 30 Minutes Intravenous 30 min pre-op 02/06/19 0757 02/06/19 0959      Current Facility-Administered Medications  Medication Dose Route Frequency Provider Last Rate Last Dose  . 0.45 % NaCl with KCl 20 mEq / L infusion   Intravenous Continuous Irine Seal, MD 100 mL/hr at 02/07/19 0550    . acetaminophen (TYLENOL) tablet 650 mg  650 mg Oral Q4H PRN Irine Seal, MD      . bisacodyl (DULCOLAX) suppository 10 mg  10 mg Rectal Daily PRN Irine Seal, MD      . cefTRIAXone (ROCEPHIN) 1 g in sodium chloride 0.9 % 100 mL IVPB  1 g Intravenous Q24H Irine Seal, MD      . furosemide (LASIX) tablet 40 mg  40 mg Oral BID Irine Seal, MD   40 mg at 02/07/19 [id number]  . HYDROcodone-acetaminophen (NORCO/VICODIN) 5-325 MG per tablet 1-2 tablet  1-2 tablet Oral Q4H PRN Irine Seal, MD   2 tablet at 02/06/19 2323  . HYDROmorphone (DILAUDID) injection 0.5-1 mg  0.5-1 mg Intravenous Q2H PRN Irine Seal, MD      . insulin aspart (novoLOG) injection 0-15 Units  0-15 Units Subcutaneous TID WC Irine Seal, MD   8 Units at 02/06/19 1623  . lactated ringers infusion   Intravenous Continuous Roberts Gaudy, MD 100 mL/hr at 02/06/19 [phone]    . lisinopril (ZESTRIL) tablet 20 mg  20 mg Oral Daily Irine Seal, MD   20 mg at 02/06/19 1609  . metFORMIN (GLUCOPHAGE) tablet 500 mg  500 mg Oral BID WC Irine Seal, MD   500 mg at 02/07/19 [id number]  . ondansetron (ZOFRAN)  injection 4 mg  4 mg Intravenous Q4H PRN Irine Seal, MD      . potassium chloride SA (KLOR-CON) CR tablet 20 mEq  20 mEq Oral Daily Irine Seal, MD   20 mEq at 02/06/19 1609  . senna-docusate (Senokot-S) tablet 1 tablet  1 tablet Oral QHS PRN Irine Seal, MD      . simvastatin (ZOCOR) tablet 20 mg  20 mg Oral QHS Irine Seal, MD   20 mg at 02/06/19 2137  . sodium phosphate (FLEET) 7-19 GM/118ML enema 1 enema  1 enema Rectal Once PRN Irine Seal, MD         Objective: Vital signs in last 24 hours: Temp:  [97.7 F (36.5 C)-98.3 F (36.8 C)] 97.8 F (36.6 C) (12/09 0442) Pulse Rate:  [53-71] 57 (12/09 0442) Resp:  [9-24] 18 (12/09 0442) BP: (103-135)/(54-91) 124/64 (12/09 0442) SpO2:  [91 %-100 %] 95 % (12/09 0442)  Intake/Output from previous day: 12/08 0701 - 12/09 0700 In: 3863.8 [P.O.:360; I.V.:2353.8; IV Piggyback:100] Out: 5925 [Urine:5875; Blood:50] Intake/Output this shift: No intake/output data recorded.   Physical Exam Vitals signs reviewed.  Constitutional:  Appearance: Normal appearance.  Neurological:     Mental Status: He is alert.     Lab Results:  No results for input(s): WBC, HGB, HCT, PLT in the last 72 hours. BMET No results for input(s): NA, K, CL, CO2, GLUCOSE, BUN, CREATININE, CALCIUM in the last 72 hours. PT/INR No results for input(s): LABPROT, INR in the last 72 hours. ABG No results for input(s): PHART, HCO3 in the last 72 hours.  Invalid input(s): PCO2, PO2  Studies/Results: No results found.   Assessment and Plan: S/P TURP for BPH with BOO.   I will reassess at noon regarding discharge.       LOS: 0 days    Irine Seal 02/07/2019 [phone]

## 2019-02-07 NOTE — Discharge Summary (Signed)
Physician Discharge Summary  Patient ID: Scott Ward MRN: [id number] DOB/AGE: [birth year] 79 y.o.  Admit date: 02/06/2019 Discharge date: 02/07/2019  Admission Diagnoses:  BPH with urinary obstruction  Discharge Diagnoses:  Principal Problem:   BPH with urinary obstruction   Past Medical History:  Diagnosis Date  . Arthritis    "lower back" (03/02/2016)  . CAP (community acquired pneumonia) 2018   Right Middle Lobe  . Colon polyp   . COPD (chronic obstructive pulmonary disease) ([place]) dx'd 03/02/2016  . Dermatochalasis   . Diabetes mellitus without complication ([place])   . Fatty liver   . High blood pressure   . High cholesterol   . History of BPH   . History of colon polyps   . HOH (hard of hearing)   . Nicotine abuse   . Right carotid bruit     Surgeries: Procedure(s): TRANSURETHRAL RESECTION OF THE PROSTATE (TURP) on 02/06/2019   Consultants (if any):   Discharged Condition: Improved  Hospital Course: [name] is an 78 y.o. male who was admitted 02/06/2019 with a diagnosis of BPH with urinary obstruction and went to the operating room on 02/06/2019 and underwent the above named procedures.  The foley was removed this morning and he was voiding well by noon and discharged home.     He was given perioperative antibiotics:  Anti-infectives (From admission, onward)   Start     Dose/Rate Route Frequency Ordered Stop   02/07/19 0900  cefTRIAXone (ROCEPHIN) 1 g in sodium chloride 0.9 % 100 mL IVPB  Status:  Discontinued     1 g 200 mL/hr over 30 Minutes Intravenous Every 24 hours 02/06/19 1427 02/07/19 1556   02/06/19 0757  cefTRIAXone (ROCEPHIN) 2 g in sodium chloride 0.9 % 100 mL IVPB     2 g 200 mL/hr over 30 Minutes Intravenous 30 min pre-op 02/06/19 0757 02/06/19 0959    .  He was given sequential compression devices for DVT prophylaxis.  He benefited maximally from the hospital stay and there were no complications.    Recent vital signs:  Vitals:   02/06/19  2025 02/07/19 0442  BP: (!) 103/55 124/64  Pulse: 60 (!) 57  Resp: 18 18  Temp: 97.7 F (36.5 C) 97.8 F (36.6 C)  SpO2: 93% 95%    Recent laboratory studies:  Lab Results  Component Value Date   HGB 13.5 02/02/2019   HGB 13.6 10/26/2018   HGB 16.3 11/15/2017   Lab Results  Component Value Date   WBC 8.6 02/02/2019   PLT 230 02/02/2019   Lab Results  Component Value Date   INR 1.12 03/02/2016   Lab Results  Component Value Date   NA 142 02/02/2019   K 3.9 02/02/2019   CL 103 02/02/2019   CO2 29 02/02/2019   BUN 37 (H) 02/02/2019   CREATININE 1.89 (H) 02/02/2019   GLUCOSE 181 (H) 02/02/2019    Discharge Medications:   Allergies as of 02/07/2019      Reactions   Metformin And Related Other (See Comments)   Per pt gives him too much gas and refuses to take.      Medication List    STOP taking these medications   finasteride 5 MG tablet Commonly known as: PROSCAR   tamsulosin 0.4 MG Caps capsule Commonly known as: FLOMAX     TAKE these medications   aspirin 81 MG tablet Take 81 mg by mouth daily.   furosemide 40 MG tablet Commonly known as:  LASIX TAKE ONE TABLET BY MOUTH TWICE A DAY What changed: when to take this   Klor-Con M20 20 MEQ tablet Generic drug: potassium chloride SA TAKE ONE TABLET BY MOUTH DAILY **MUST CALL MD FOR APPOINTMENT What changed: See the new instructions.   lisinopril 20 MG tablet Commonly known as: ZESTRIL TAKE ONE TABLET BY MOUTH DAILY   metFORMIN 500 MG tablet Commonly known as: GLUCOPHAGE TAKE TWO TABLETS BY MOUTH TWICE A DAY WITH MEALS What changed: See the new instructions.   simvastatin 20 MG tablet Commonly known as: ZOCOR TAKE ONE TABLET BY MOUTH EVERY NIGHT AT BEDTIME       Diagnostic Studies: No results found.  Disposition:     Follow-up Information    [facility] On 02/27/2019.   Why: 9:15a Contact information: [address] [phone]           Signed: Lizbeth Wodrich 02/07/2019, 5:14 PM

## 2019-02-27 DIAGNOSIS — R338 Other retention of urine: Secondary | ICD-10-CM | POA: Diagnosis not present

## 2019-02-27 DIAGNOSIS — N401 Enlarged prostate with lower urinary tract symptoms: Secondary | ICD-10-CM | POA: Diagnosis not present

## 2019-03-19 ENCOUNTER — Other Ambulatory Visit: Payer: Self-pay | Admitting: Family Medicine

## 2019-03-19 DIAGNOSIS — M7989 Other specified soft tissue disorders: Secondary | ICD-10-CM

## 2019-04-20 DIAGNOSIS — Z0100 Encounter for examination of eyes and vision without abnormal findings: Secondary | ICD-10-CM | POA: Diagnosis not present

## 2019-04-20 DIAGNOSIS — E119 Type 2 diabetes mellitus without complications: Secondary | ICD-10-CM | POA: Diagnosis not present

## 2019-04-20 DIAGNOSIS — H524 Presbyopia: Secondary | ICD-10-CM | POA: Diagnosis not present

## 2019-04-30 ENCOUNTER — Other Ambulatory Visit: Payer: Self-pay

## 2019-04-30 ENCOUNTER — Ambulatory Visit (INDEPENDENT_AMBULATORY_CARE_PROVIDER_SITE_OTHER): Payer: Medicare HMO | Admitting: Family Medicine

## 2019-04-30 ENCOUNTER — Encounter: Payer: Self-pay | Admitting: Family Medicine

## 2019-04-30 VITALS — BP 130/80 | HR 82 | Temp 97.3°F | Resp 16 | Ht 71.0 in | Wt 194.0 lb

## 2019-04-30 DIAGNOSIS — J438 Other emphysema: Secondary | ICD-10-CM | POA: Diagnosis not present

## 2019-04-30 DIAGNOSIS — N289 Disorder of kidney and ureter, unspecified: Secondary | ICD-10-CM | POA: Diagnosis not present

## 2019-04-30 DIAGNOSIS — I10 Essential (primary) hypertension: Secondary | ICD-10-CM | POA: Diagnosis not present

## 2019-04-30 DIAGNOSIS — E7800 Pure hypercholesterolemia, unspecified: Secondary | ICD-10-CM

## 2019-04-30 DIAGNOSIS — E119 Type 2 diabetes mellitus without complications: Secondary | ICD-10-CM

## 2019-04-30 MED ORDER — ALBUTEROL SULFATE HFA 108 (90 BASE) MCG/ACT IN AERS: 2.0000 | INHALATION_SPRAY | Freq: Four times a day (QID) | RESPIRATORY_TRACT | 1 refills | Status: DC | PRN

## 2019-04-30 MED ORDER — FINASTERIDE 5 MG PO TABS: 5.0000 mg | ORAL_TABLET | Freq: Every day | ORAL | 3 refills | Status: DC

## 2019-04-30 NOTE — Progress Notes (Signed)
Subjective:    Patient ID: LEV CIOLEK, male    DOB: [birth year], 79 y.o.   MRN: [id number]  Medication Refill  In December, I recommended holding lasix due to a rise in his creatinine and possible dehydration.  Patient's son was notified via [place] however he never received a communication.  Therefore the patient never stopped the Lasix and has not returned to recheck his renal function.  At that time, HgA1c was 6.8.  Patient had TURP in December.  He states that his urinary retention has improved 70 to 80%.  He is still taking finasteride although this is not on his medication list.  He is taking 5 mg a day and would like a refill for this.  He was previously receiving this from his urologist.  He denies any side effects from the medication.  He denies any chest pain, shortness of breath, dyspnea on exertion.  He is still wheezing today on exam.  He does not feel short of breath.  He denies any bronchospasm.  However patient clearly has wheezing on his physical exam and has COPD.  He does not have a rescue inhaler at home.  He reports that he feels like his legs will give out on him at times.  They feel weak.  However he has palpable dorsalis pedis and posterior tibialis pulses bilaterally and no evidence of diabetic neuropathy on physical exam.  I suspect some of this is deconditioning.  He has not had his COVID-19 vaccination yet.  Past Medical History:  Diagnosis Date  . Arthritis    "lower back" (03/02/2016)  . CAP (community acquired pneumonia) 2018   Right Middle Lobe  . Colon polyp   . COPD (chronic obstructive pulmonary disease) ([place]) dx'd 03/02/2016  . Dermatochalasis   . Diabetes mellitus without complication ([place])   . Fatty liver   . High blood pressure   . High cholesterol   . History of BPH   . History of colon polyps   . HOH (hard of hearing)   . Nicotine abuse   . Right carotid bruit    Past Surgical History:  Procedure Laterality Date  . BLEPHAROPLASTY Bilateral   .  CATARACT EXTRACTION W/ INTRAOCULAR LENS  IMPLANT, BILATERAL    . COLONOSCOPY    . TRANSURETHRAL RESECTION OF PROSTATE N/A 02/06/2019   Procedure: TRANSURETHRAL RESECTION OF THE PROSTATE (TURP);  Surgeon: Irine Seal, MD;  Location: [facility];  Service: Urology;  Laterality: N/A;   Current Outpatient Medications on File Prior to Visit  Medication Sig Dispense Refill  . aspirin 81 MG tablet Take 81 mg by mouth daily.      . furosemide (LASIX) 40 MG tablet TAKE ONE TABLET BY MOUTH TWICE A DAY 180 tablet 1  . lisinopril (ZESTRIL) 20 MG tablet TAKE ONE TABLET BY MOUTH DAILY 90 tablet 1  . metFORMIN (GLUCOPHAGE) 500 MG tablet TAKE TWO TABLETS BY MOUTH TWICE A DAY WITH MEALS 360 tablet 1  . potassium chloride SA (KLOR-CON M20) 20 MEQ tablet Take 1 tablet (20 mEq total) by mouth daily. 90 tablet 1  . simvastatin (ZOCOR) 20 MG tablet TAKE ONE TABLET BY MOUTH EVERY NIGHT AT BEDTIME 90 tablet 1   No current facility-administered medications on file prior to visit.    Allergies  Allergen Reactions  . Metformin And Related Other (See Comments)    Per pt gives him too much gas and refuses to take.   Social History   Socioeconomic History  .  Marital status: Divorced    Spouse name: Not on file  . Number of children: 1  . Years of education: 8  . Highest education level: Not on file  Occupational History  . Occupation: RETIRED  Tobacco Use  . Smoking status: Current Every Day Smoker    Packs/day: 1.00    Years: 60.00    Pack years: 60.00    Types: Cigarettes  . Smokeless tobacco: Never Used  Substance and Sexual Activity  . Alcohol use: Not Currently    Comment: 03/02/2016 "nothing since 1973"  . Drug use: No  . Sexual activity: Never  Other Topics Concern  . Not on file  Social History Narrative   Patient is single with one child.   Patient is left handed.   Patient has an 8 th grade education.   Patient drinks up to 3 cups daily.   Social Determinants of Health   Financial Resource  Strain:   . Difficulty of Paying Living Expenses: Not on file  Food Insecurity:   . Worried About Charity fundraiser in the Last Year: Not on file  . Ran Out of Food in the Last Year: Not on file  Transportation Needs:   . Lack of Transportation (Medical): Not on file  . Lack of Transportation (Non-Medical): Not on file  Physical Activity:   . Days of Exercise per Week: Not on file  . Minutes of Exercise per Session: Not on file  Stress:   . Feeling of Stress : Not on file  Social Connections:   . Frequency of Communication with Friends and Family: Not on file  . Frequency of Social Gatherings with Friends and Family: Not on file  . Attends Religious Services: Not on file  . Active Member of Clubs or Organizations: Not on file  . Attends Archivist Meetings: Not on file  . Marital Status: Not on file  Intimate Partner Violence:   . Fear of Current or Ex-Partner: Not on file  . Emotionally Abused: Not on file  . Physically Abused: Not on file  . Sexually Abused: Not on file   Family History  Problem Relation Age of Onset  . Heart attack Father   . Heart attack Sister   . Lung cancer Sister       Review of Systems  All other systems reviewed and are negative.      Objective:   Physical Exam  Constitutional: He is oriented to person, place, and time. He appears well-developed and well-nourished. No distress.  HENT:  Head: Normocephalic and atraumatic.  Right Ear: External ear normal.  Left Ear: External ear normal.  Nose: Nose normal.  Mouth/Throat: Oropharynx is clear and moist. No oropharyngeal exudate.  Eyes: Pupils are equal, round, and reactive to light. Conjunctivae are normal. Right eye exhibits no discharge. Left eye exhibits no discharge. No scleral icterus.  Neck: No JVD present.  Cardiovascular: Normal rate, regular rhythm and normal heart sounds. Exam reveals no gallop and no friction rub.  No murmur heard. Pulmonary/Chest: Effort normal. No  respiratory distress. He has wheezes. He has no rales. He exhibits no tenderness.  Abdominal: Soft. Bowel sounds are normal. He exhibits no distension and no mass. There is no abdominal tenderness. There is no rebound and no guarding.  Musculoskeletal:        General: No tenderness, deformity or edema. Normal range of motion.     Cervical back: Normal range of motion and neck supple.  Lymphadenopathy:  He has no cervical adenopathy.  Neurological: He is alert and oriented to person, place, and time. He has normal reflexes. No cranial nerve deficit. He exhibits normal muscle tone. Coordination normal.  Skin: Skin is warm. No rash noted. He is not diaphoretic. No erythema. No pallor.  Psychiatric: He has a normal mood and affect. His behavior is normal. Judgment and thought content normal.  Vitals reviewed.         Assessment & Plan:  Controlled type 2 diabetes mellitus without complication, without long-term current use of insulin ([place]) - Plan: Hemoglobin A1c, CBC with Differential/Platelet, COMPLETE METABOLIC PANEL WITH GFR, Lipid panel, Microalbumin, urine  Benign essential HTN - Plan: CBC with Differential/Platelet, COMPLETE METABOLIC PANEL WITH GFR, Lipid panel  Pure hypercholesterolemia - Plan: CBC with Differential/Platelet, COMPLETE METABOLIC PANEL WITH GFR, Lipid panel  Renal insufficiency  Other emphysema ([place])  I am very happy with his blood pressure today at 130/80 this is outstanding.  I am also happy to see that the patient's lower urinary tract symptoms have improved after TURP.  We will continue finasteride 5 mg a day.  I did recommend that he have an albuterol inhaler for emergencies.  I suspect the patient may have significant wheezing if he develops an upper respiratory infection.  Also encouraged the patient to the COVID-19 vaccination at his earliest convenience.  I will check a fasting lipid panel.  Ideally I like his LDL cholesterol below 100 and is close to 70 as  possible.  Also check a hemoglobin A1c.  Given his age, hemoglobin A1c less than 7 would be acceptable.  Monitor his renal and liver function test along with a CBC.

## 2019-05-01 DIAGNOSIS — H26493 Other secondary cataract, bilateral: Secondary | ICD-10-CM | POA: Diagnosis not present

## 2019-05-01 DIAGNOSIS — H353131 Nonexudative age-related macular degeneration, bilateral, early dry stage: Secondary | ICD-10-CM | POA: Diagnosis not present

## 2019-05-01 LAB — HEMOGLOBIN A1C
Hgb A1c MFr Bld: 6.4 % of total Hgb — ABNORMAL HIGH (ref <5.7)
Mean Plasma Glucose: 137 (calc)
eAG (mmol/L): 7.6 (calc)

## 2019-05-01 LAB — CBC WITH DIFFERENTIAL/PLATELET
Absolute Monocytes: 977 cells/uL — ABNORMAL HIGH (ref 200–950)
Basophils Absolute: 62 cells/uL (ref 0–200)
Basophils Relative: 0.7 %
Eosinophils Absolute: 273 cells/uL (ref 15–500)
Eosinophils Relative: 3.1 %
HCT: 39 % (ref 38.5–50.0)
Hemoglobin: 13.2 g/dL (ref 13.2–17.1)
Lymphs Abs: 1276 cells/uL (ref 850–3900)
MCH: 31.6 pg (ref 27.0–33.0)
MCHC: 33.8 g/dL (ref 32.0–36.0)
MCV: 93.3 fL (ref 80.0–100.0)
MPV: 10.7 fL (ref 7.5–12.5)
Monocytes Relative: 11.1 %
Neutro Abs: 6213 cells/uL (ref 1500–7800)
Neutrophils Relative %: 70.6 %
Platelets: 247 10*3/uL (ref 140–400)
RBC: 4.18 10*6/uL — ABNORMAL LOW (ref 4.20–5.80)
RDW: 13.4 % (ref 11.0–15.0)
Total Lymphocyte: 14.5 %
WBC: 8.8 10*3/uL (ref 3.8–10.8)

## 2019-05-01 LAB — COMPLETE METABOLIC PANEL WITH GFR
AG Ratio: 1.7 (calc) (ref 1.0–2.5)
ALT: 12 U/L (ref 9–46)
AST: 14 U/L (ref 10–35)
Albumin: 4 g/dL (ref 3.6–5.1)
Alkaline phosphatase (APISO): 90 U/L (ref 35–144)
BUN/Creatinine Ratio: 14 (calc) (ref 6–22)
BUN: 29 mg/dL — ABNORMAL HIGH (ref 7–25)
CO2: 29 mmol/L (ref 20–32)
Calcium: 9.4 mg/dL (ref 8.6–10.3)
Chloride: 104 mmol/L (ref 98–110)
Creat: 2.01 mg/dL — ABNORMAL HIGH (ref 0.70–1.11)
GFR, Est African American: 35 mL/min/{1.73_m2} — ABNORMAL LOW (ref >=60)
GFR, Est Non African American: 30 mL/min/{1.73_m2} — ABNORMAL LOW (ref >=60)
Globulin: 2.4 g/dL (calc) (ref 1.9–3.7)
Glucose, Bld: 114 mg/dL — ABNORMAL HIGH (ref 65–99)
Potassium: 4.8 mmol/L (ref 3.5–5.3)
Sodium: 143 mmol/L (ref 135–146)
Total Bilirubin: 0.3 mg/dL (ref 0.2–1.2)
Total Protein: 6.4 g/dL (ref 6.1–8.1)

## 2019-05-01 LAB — LIPID PANEL
Cholesterol: 155 mg/dL (ref <200)
HDL: 40 mg/dL (ref >=40)
LDL Cholesterol (Calc): 88 mg/dL (calc)
Non-HDL Cholesterol (Calc): 115 mg/dL (calc) (ref <130)
Total CHOL/HDL Ratio: 3.9 (calc) (ref <5.0)
Triglycerides: 176 mg/dL — ABNORMAL HIGH (ref <150)

## 2019-05-01 LAB — MICROALBUMIN, URINE: Microalb, Ur: 6.3 mg/dL

## 2019-05-06 ENCOUNTER — Other Ambulatory Visit: Payer: Self-pay | Admitting: Family Medicine

## 2019-05-14 ENCOUNTER — Encounter: Payer: Self-pay | Admitting: Family Medicine

## 2019-05-14 ENCOUNTER — Ambulatory Visit (INDEPENDENT_AMBULATORY_CARE_PROVIDER_SITE_OTHER): Payer: Medicare HMO | Admitting: Family Medicine

## 2019-05-14 ENCOUNTER — Other Ambulatory Visit: Payer: Self-pay

## 2019-05-14 VITALS — BP 130/82 | HR 80 | Temp 97.4°F | Resp 16 | Ht 71.0 in | Wt 195.0 lb

## 2019-05-14 DIAGNOSIS — N289 Disorder of kidney and ureter, unspecified: Secondary | ICD-10-CM | POA: Diagnosis not present

## 2019-05-14 NOTE — Progress Notes (Signed)
Subjective:    Patient ID: Scott Ward, male    DOB: 1940-11-11, 79 y.o.   MRN: [id number]  Medication Refill  Please see the patient's last office visit.  His creatinine continues to rise up from 1.5-1.8 now 2.01.  Therefore of asked him to reduce his dose of Lasix from 40 twice a day to 40 once a day.  After making the change, the patient denies any shortness of breath.  He denies any orthopnea.  He denies any paroxysmal nocturnal dyspnea.  There is no pitting edema in his legs.  He cannot tell a change since decreasing his dose of Lasix thankfully and his breathing.  Clinically he does not appear dehydrated today.  Past Medical History:  Diagnosis Date  . Arthritis    "lower back" (03/02/2016)  . CAP (community acquired pneumonia) 2018   Right Middle Lobe  . Colon polyp   . COPD (chronic obstructive pulmonary disease) ([place]) dx'd 03/02/2016  . Dermatochalasis   . Diabetes mellitus without complication ([place])   . Fatty liver   . High blood pressure   . High cholesterol   . History of BPH   . History of colon polyps   . HOH (hard of hearing)   . Nicotine abuse   . Right carotid bruit    Past Surgical History:  Procedure Laterality Date  . BLEPHAROPLASTY Bilateral   . CATARACT EXTRACTION W/ INTRAOCULAR LENS  IMPLANT, BILATERAL    . COLONOSCOPY    . TRANSURETHRAL RESECTION OF PROSTATE N/A 02/06/2019   Procedure: TRANSURETHRAL RESECTION OF THE PROSTATE (TURP);  Surgeon: Irine Seal, MD;  Location: [facility];  Service: Urology;  Laterality: N/A;   Current Outpatient Medications on File Prior to Visit  Medication Sig Dispense Refill  . albuterol (VENTOLIN HFA) 108 (90 Base) MCG/ACT inhaler Inhale 2 puffs into the lungs every 6 (six) hours as needed for wheezing or shortness of breath. 18 g 1  . aspirin 81 MG tablet Take 81 mg by mouth daily.      . finasteride (PROSCAR) 5 MG tablet Take 1 tablet (5 mg total) by mouth daily. 90 tablet 3  . furosemide (LASIX) 40 MG tablet TAKE ONE TABLET  BY MOUTH TWICE A DAY (Patient taking differently: Take 40 mg by mouth daily. ) 180 tablet 1  . lisinopril (ZESTRIL) 20 MG tablet TAKE ONE TABLET BY MOUTH DAILY 90 tablet 1  . metFORMIN (GLUCOPHAGE) 500 MG tablet TAKE TWO TABLETS BY MOUTH TWICE A DAY WITH MEALS 360 tablet 1  . potassium chloride SA (KLOR-CON M20) 20 MEQ tablet Take 1 tablet (20 mEq total) by mouth daily. 90 tablet 1  . simvastatin (ZOCOR) 20 MG tablet TAKE ONE TABLET BY MOUTH EVERY NIGHT AT BEDTIME 90 tablet 1   No current facility-administered medications on file prior to visit.    Allergies  Allergen Reactions  . Metformin And Related Other (See Comments)    Per pt gives him too much gas and refuses to take.   Social History   Socioeconomic History  . Marital status: Divorced    Spouse name: Not on file  . Number of children: 1  . Years of education: 8  . Highest education level: Not on file  Occupational History  . Occupation: RETIRED  Tobacco Use  . Smoking status: Current Every Day Smoker    Packs/day: 1.00    Years: 60.00    Pack years: 60.00    Types: Cigarettes  . Smokeless tobacco: Never Used  Substance and Sexual Activity  . Alcohol use: Not Currently    Comment: 03/02/2016 "nothing since 1973"  . Drug use: No  . Sexual activity: Never  Other Topics Concern  . Not on file  Social History Narrative   Patient is single with one child.   Patient is left handed.   Patient has an 8 th grade education.   Patient drinks up to 3 cups daily.   Social Determinants of Health   Financial Resource Strain:   . Difficulty of Paying Living Expenses:   Food Insecurity:   . Worried About Charity fundraiser in the Last Year:   . Arboriculturist in the Last Year:   Transportation Needs:   . Film/video editor (Medical):   Marland Kitchen Lack of Transportation (Non-Medical):   Physical Activity:   . Days of Exercise per Week:   . Minutes of Exercise per Session:   Stress:   . Feeling of Stress :   Social  Connections:   . Frequency of Communication with Friends and Family:   . Frequency of Social Gatherings with Friends and Family:   . Attends Religious Services:   . Active Member of Clubs or Organizations:   . Attends Archivist Meetings:   Marland Kitchen Marital Status:   Intimate Partner Violence:   . Fear of Current or Ex-Partner:   . Emotionally Abused:   Marland Kitchen Physically Abused:   . Sexually Abused:    Family History  Problem Relation Age of Onset  . Heart attack Father   . Heart attack Sister   . Lung cancer Sister       Review of Systems  All other systems reviewed and are negative.      Objective:   Physical Exam  Constitutional: He is oriented to person, place, and time. He appears well-developed and well-nourished. No distress.  HENT:  Head: Normocephalic and atraumatic.  Right Ear: External ear normal.  Left Ear: External ear normal.  Nose: Nose normal.  Mouth/Throat: Oropharynx is clear and moist. No oropharyngeal exudate.  Eyes: Pupils are equal, round, and reactive to light. Conjunctivae are normal. Right eye exhibits no discharge. Left eye exhibits no discharge. No scleral icterus.  Neck: No JVD present.  Cardiovascular: Normal rate, regular rhythm and normal heart sounds. Exam reveals no gallop and no friction rub.  No murmur heard. Pulmonary/Chest: Effort normal. No respiratory distress. He has wheezes. He has no rales. He exhibits no tenderness.  Abdominal: Soft. Bowel sounds are normal. He exhibits no distension and no mass. There is no abdominal tenderness. There is no rebound and no guarding.  Musculoskeletal:        General: No tenderness, deformity or edema. Normal range of motion.     Cervical back: Normal range of motion and neck supple.  Lymphadenopathy:    He has no cervical adenopathy.  Neurological: He is alert and oriented to person, place, and time. He has normal reflexes. No cranial nerve deficit. He exhibits normal muscle tone. Coordination  normal.  Skin: Skin is warm. No rash noted. He is not diaphoretic. No erythema. No pallor.  Psychiatric: He has a normal mood and affect. His behavior is normal. Judgment and thought content normal.  Vitals reviewed.         Assessment & Plan:  Renal insufficiency - Plan: BASIC METABOLIC PANEL WITH GFR  Patient's blood pressure remains excellent on the reduced dose of Lasix at 130/82.  I do not appreciate any crackles  in his lungs although he is wheezing.  There is no pitting edema in his legs.  His son does an amazing job checking on him every day and has not seen any sign of swelling.  Therefore we will recheck his renal function today.  If still elevated I honestly think the patient could stop the Lasix altogether as he does not appear to be retaining fluid.  He may just require as needed Lasix.  For the time being and check renal function.  If back to baseline we will leave the patient on 40 mg once a day.  If still elevated, I would recommend discontinuing Lasix and switching to as needed.

## 2019-05-15 LAB — BASIC METABOLIC PANEL WITH GFR
BUN/Creatinine Ratio: 17 (calc) (ref 6–22)
BUN: 31 mg/dL — ABNORMAL HIGH (ref 7–25)
CO2: 27 mmol/L (ref 20–32)
Calcium: 9.4 mg/dL (ref 8.6–10.3)
Chloride: 108 mmol/L (ref 98–110)
Creat: 1.79 mg/dL — ABNORMAL HIGH (ref 0.70–1.11)
GFR, Est African American: 41 mL/min/{1.73_m2} — ABNORMAL LOW (ref >=60)
GFR, Est Non African American: 35 mL/min/{1.73_m2} — ABNORMAL LOW (ref >=60)
Glucose, Bld: 113 mg/dL — ABNORMAL HIGH (ref 65–99)
Potassium: 5.1 mmol/L (ref 3.5–5.3)
Sodium: 144 mmol/L (ref 135–146)

## 2019-05-25 ENCOUNTER — Other Ambulatory Visit: Payer: Self-pay | Admitting: Family Medicine

## 2019-06-03 IMAGING — US US CAROTID DUPLEX BILAT
1 series · 13 of 24 positions shown · non-contrast
Comparison: None.

CLINICAL DATA: 77-year-old male with a history of carotid bruit

EXAM:
BILATERAL CAROTID DUPLEX ULTRASOUND
TECHNIQUE: Gray scale imaging, color Doppler and duplex ultrasound were
performed of bilateral carotid and vertebral arteries in the neck.

[Series 1: us carotid duplex bilat · 0.09mm/px · 13 of 44 slices shown]
[im 1/44]
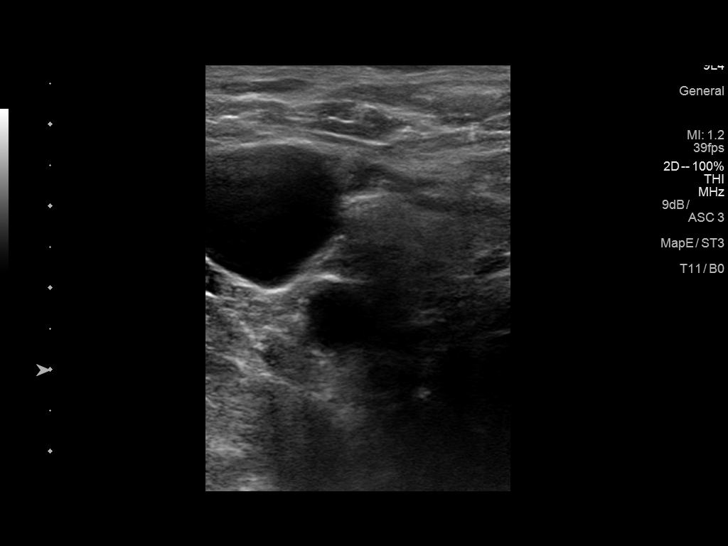
[im 4/44]
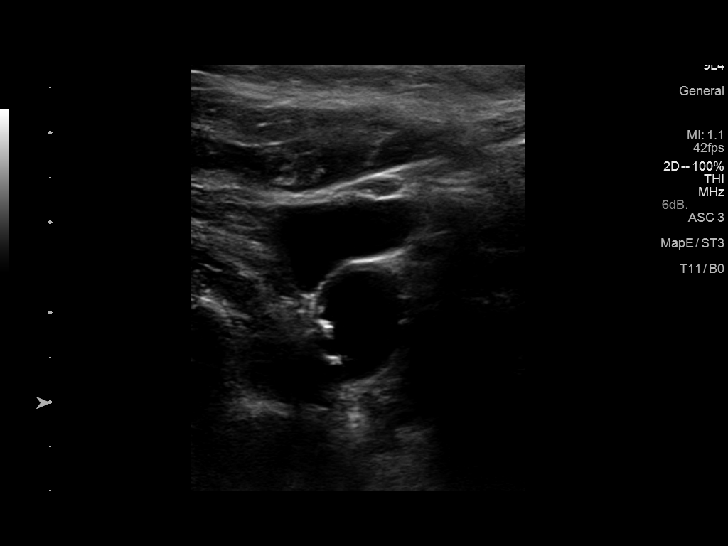
[im 8/44]
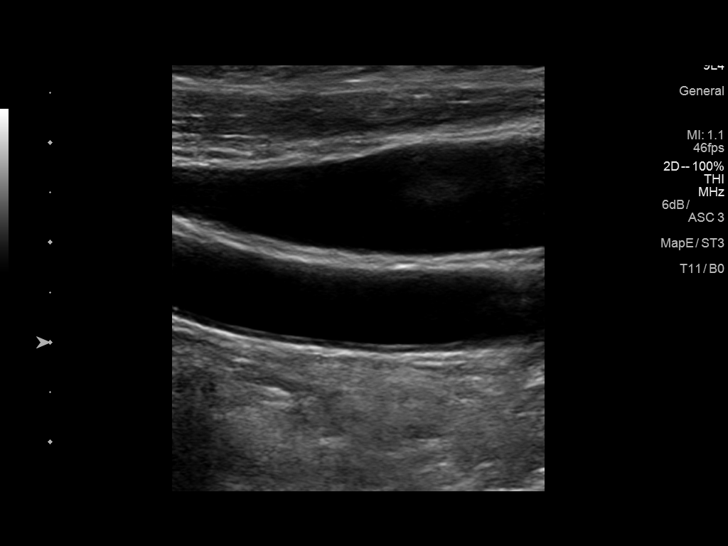
[im 12/44]
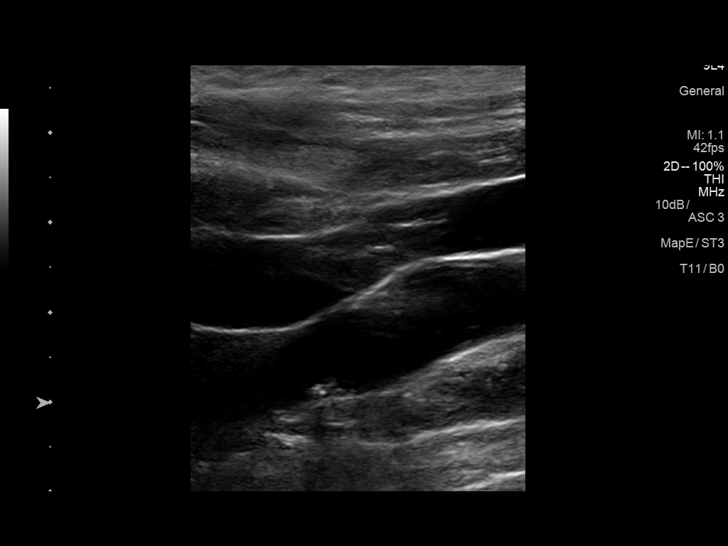
[im 15/44]
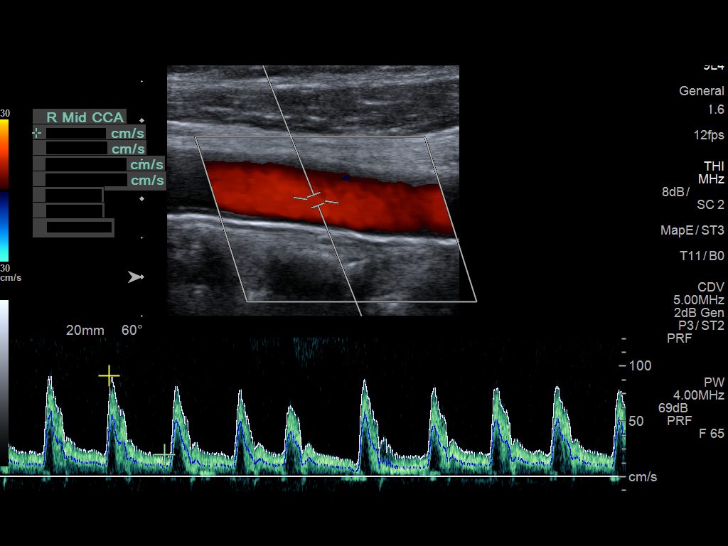
[im 19/44]
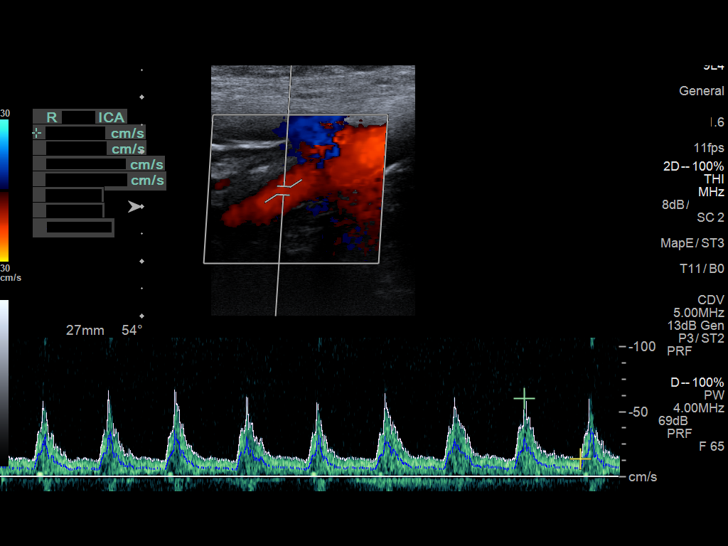
[im 23/44]
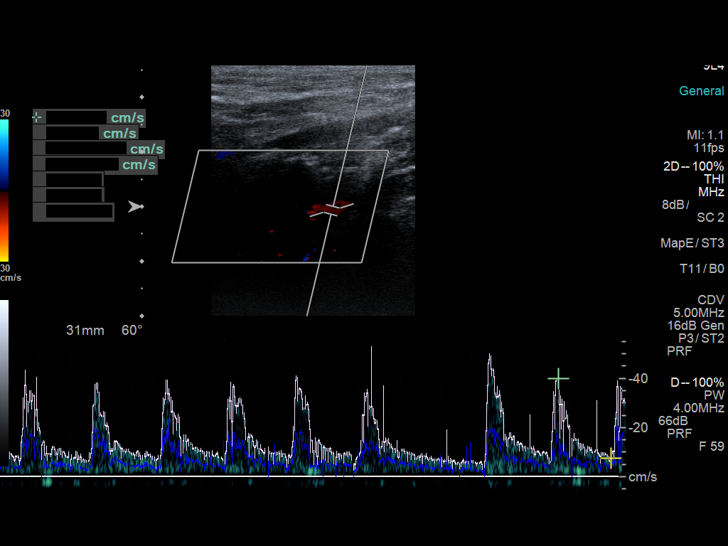
[im 25/44]
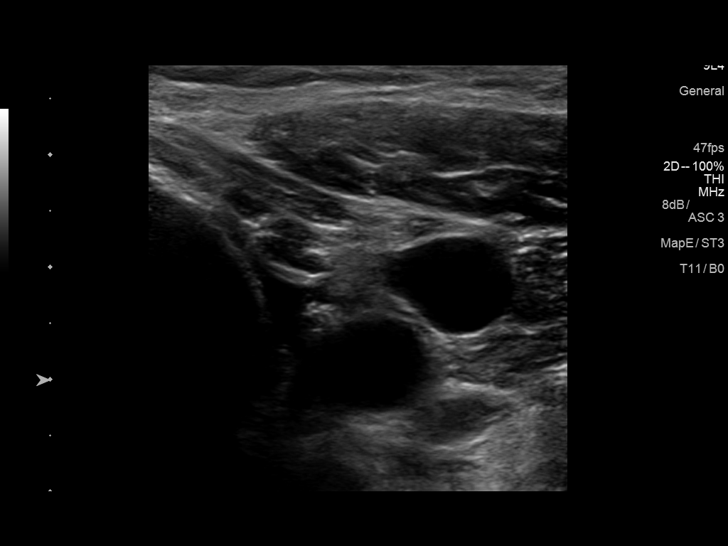
[im 29/44]
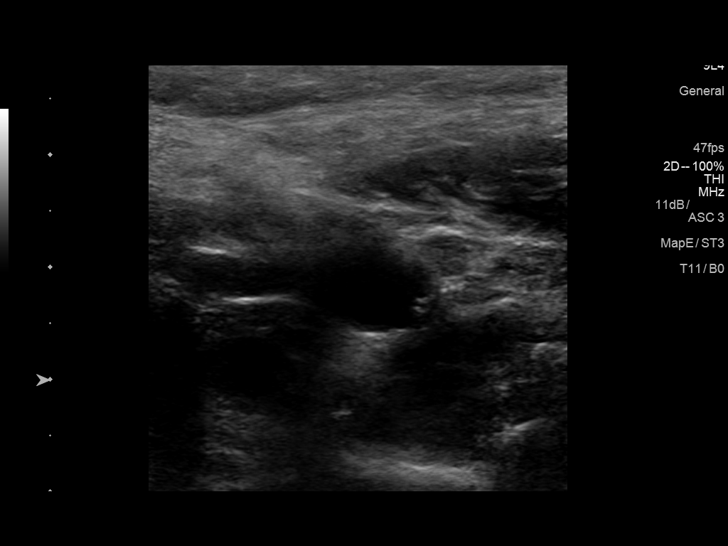
[im 32/44]
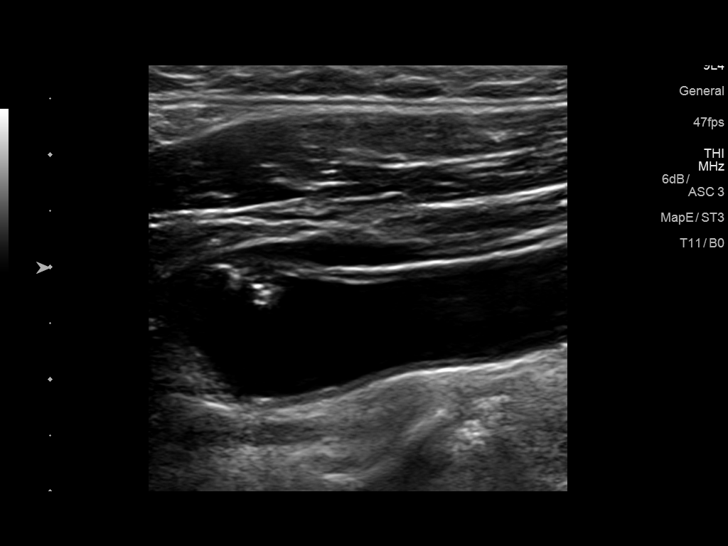
[im 36/44]
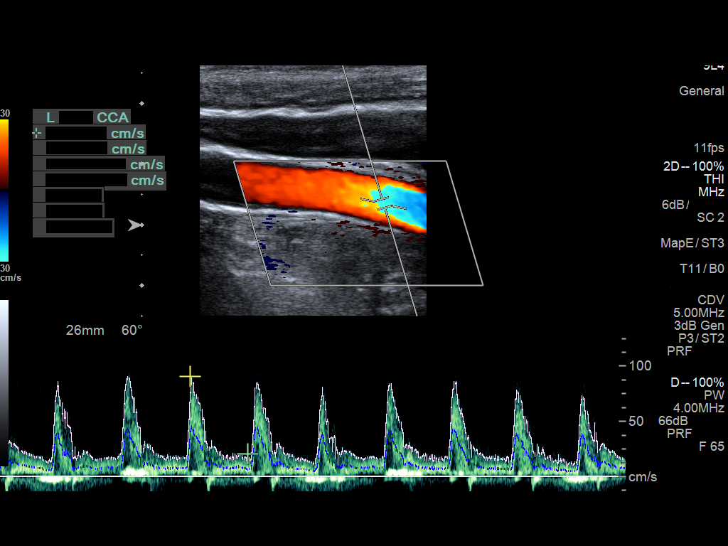
[im 40/44]
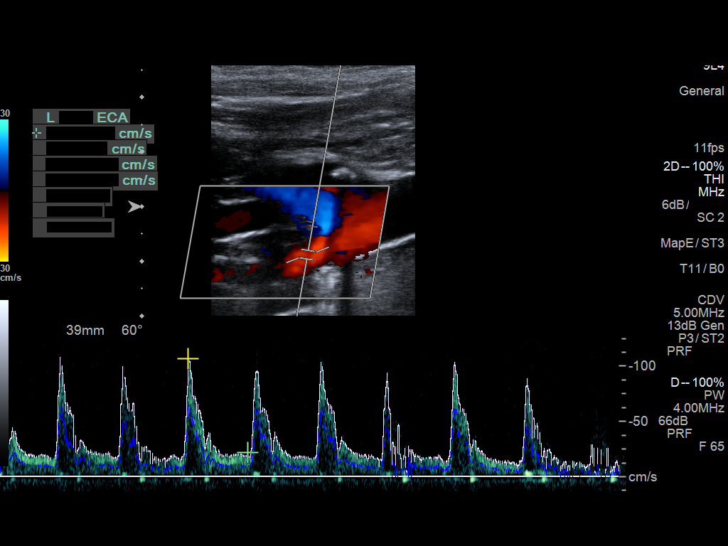
[im 44/44]
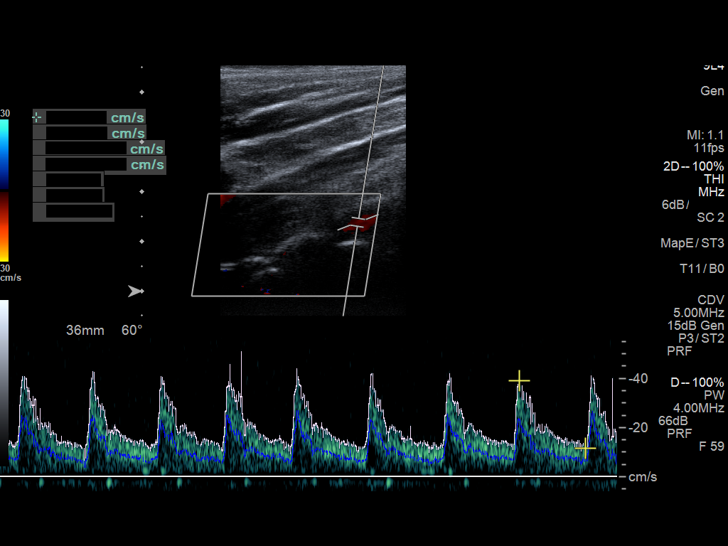

[13 of 24 positions shown; findings below may reference images not displayed]

FINDINGS: Criteria: Quantification of carotid stenosis is based on velocity
parameters that correlate the residual internal carotid diameter
with NASCET-based stenosis levels, using the diameter of the distal
internal carotid lumen as the denominator for stenosis measurement.

The following velocity measurements were obtained:

RIGHT

ICA:  Systolic 75 cm/sec, Diastolic 13 cm/sec

CCA:  91 cm/sec

SYSTOLIC ICA/CCA RATIO:

ECA:  89 cm/sec

LEFT

ICA:  Systolic 78 cm/sec, Diastolic 26 cm/sec

CCA:  91 cm/sec

SYSTOLIC ICA/CCA RATIO:

ECA:  107 cm/sec

Right Brachial SBP: 153

Left Brachial SBP: 141

RIGHT CAROTID ARTERY: No significant calcifications of the right
common carotid artery. Intermediate waveform maintained.
Heterogeneous and partially calcified plaque at the right carotid
bifurcation. No significant lumen shadowing. Low resistance waveform
of the right ICA. No significant tortuosity.

RIGHT VERTEBRAL ARTERY: Antegrade flow with low resistance waveform.

LEFT CAROTID ARTERY: No significant calcifications of the left
common carotid artery. Intermediate waveform maintained.
Heterogeneous and partially calcified plaque at the left carotid
bifurcation without significant lumen shadowing. Low resistance
waveform of the left ICA. No significant tortuosity.

LEFT VERTEBRAL ARTERY:  Antegrade flow with low resistance waveform.
IMPRESSION: Color duplex indicates minimal heterogeneous and calcified plaque,
with no hemodynamically significant stenosis by duplex criteria in
the extracranial cerebrovascular circulation.

## 2019-06-17 ENCOUNTER — Other Ambulatory Visit: Payer: Self-pay | Admitting: Family Medicine

## 2019-07-30 ENCOUNTER — Other Ambulatory Visit: Payer: Self-pay | Admitting: Family Medicine

## 2019-08-21 ENCOUNTER — Encounter: Payer: Self-pay | Admitting: Family Medicine

## 2019-08-23 ENCOUNTER — Other Ambulatory Visit: Payer: Self-pay | Admitting: Family Medicine

## 2019-08-23 MED ORDER — FUROSEMIDE 40 MG PO TABS: 40.0000 mg | ORAL_TABLET | Freq: Every day | ORAL | 1 refills | Status: DC | PRN

## 2019-09-16 ENCOUNTER — Other Ambulatory Visit: Payer: Self-pay | Admitting: Family Medicine

## 2019-09-16 DIAGNOSIS — M7989 Other specified soft tissue disorders: Secondary | ICD-10-CM

## 2019-09-27 ENCOUNTER — Ambulatory Visit (HOSPITAL_COMMUNITY)
Admission: EM | Admit: 2019-09-27 | Discharge: 2019-09-27 | Disposition: A | Discharge status: Home / Self Care | Payer: Medicare HMO

## 2019-09-27 ENCOUNTER — Other Ambulatory Visit: Payer: Self-pay

## 2019-09-27 ENCOUNTER — Emergency Department (HOSPITAL_COMMUNITY)
Admission: EM | Admit: 2019-09-27 | Discharge: 2019-09-27 | Disposition: A | Discharge status: Home / Self Care | Payer: Medicare HMO | Attending: Emergency Medicine | Admitting: Emergency Medicine

## 2019-09-27 ENCOUNTER — Emergency Department (HOSPITAL_COMMUNITY): Payer: Medicare HMO

## 2019-09-27 DIAGNOSIS — S0181XA Laceration without foreign body of other part of head, initial encounter: Secondary | ICD-10-CM | POA: Diagnosis not present

## 2019-09-27 DIAGNOSIS — Z7984 Long term (current) use of oral hypoglycemic drugs: Secondary | ICD-10-CM | POA: Insufficient documentation

## 2019-09-27 DIAGNOSIS — Z79899 Other long term (current) drug therapy: Secondary | ICD-10-CM | POA: Diagnosis not present

## 2019-09-27 DIAGNOSIS — E119 Type 2 diabetes mellitus without complications: Secondary | ICD-10-CM | POA: Diagnosis not present

## 2019-09-27 DIAGNOSIS — W1809XA Striking against other object with subsequent fall, initial encounter: Secondary | ICD-10-CM | POA: Diagnosis not present

## 2019-09-27 DIAGNOSIS — Y9389 Activity, other specified: Secondary | ICD-10-CM | POA: Diagnosis not present

## 2019-09-27 DIAGNOSIS — Y9289 Other specified places as the place of occurrence of the external cause: Secondary | ICD-10-CM | POA: Diagnosis not present

## 2019-09-27 DIAGNOSIS — J3489 Other specified disorders of nose and nasal sinuses: Secondary | ICD-10-CM | POA: Diagnosis not present

## 2019-09-27 DIAGNOSIS — Z7951 Long term (current) use of inhaled steroids: Secondary | ICD-10-CM | POA: Insufficient documentation

## 2019-09-27 DIAGNOSIS — I10 Essential (primary) hypertension: Secondary | ICD-10-CM | POA: Diagnosis not present

## 2019-09-27 DIAGNOSIS — S61214A Laceration without foreign body of right ring finger without damage to nail, initial encounter: Secondary | ICD-10-CM | POA: Diagnosis not present

## 2019-09-27 DIAGNOSIS — F17210 Nicotine dependence, cigarettes, uncomplicated: Secondary | ICD-10-CM | POA: Insufficient documentation

## 2019-09-27 DIAGNOSIS — S0101XA Laceration without foreign body of scalp, initial encounter: Secondary | ICD-10-CM | POA: Diagnosis not present

## 2019-09-27 DIAGNOSIS — S6990XA Unspecified injury of unspecified wrist, hand and finger(s), initial encounter: Secondary | ICD-10-CM

## 2019-09-27 DIAGNOSIS — J449 Chronic obstructive pulmonary disease, unspecified: Secondary | ICD-10-CM | POA: Insufficient documentation

## 2019-09-27 DIAGNOSIS — M5030 Other cervical disc degeneration, unspecified cervical region: Secondary | ICD-10-CM | POA: Diagnosis not present

## 2019-09-27 DIAGNOSIS — S199XXA Unspecified injury of neck, initial encounter: Secondary | ICD-10-CM | POA: Diagnosis not present

## 2019-09-27 DIAGNOSIS — R9082 White matter disease, unspecified: Secondary | ICD-10-CM | POA: Diagnosis not present

## 2019-09-27 DIAGNOSIS — R42 Dizziness and giddiness: Secondary | ICD-10-CM | POA: Diagnosis not present

## 2019-09-27 DIAGNOSIS — S0990XA Unspecified injury of head, initial encounter: Secondary | ICD-10-CM

## 2019-09-27 DIAGNOSIS — Y999 Unspecified external cause status: Secondary | ICD-10-CM | POA: Diagnosis not present

## 2019-09-27 DIAGNOSIS — I7090 Unspecified atherosclerosis: Secondary | ICD-10-CM | POA: Diagnosis not present

## 2019-09-27 DIAGNOSIS — R69 Illness, unspecified: Secondary | ICD-10-CM | POA: Diagnosis not present

## 2019-09-27 LAB — CBC
HCT: 38.8 % — ABNORMAL LOW (ref 39.0–52.0)
Hemoglobin: 12.9 g/dL — ABNORMAL LOW (ref 13.0–17.0)
MCH: 31.5 pg (ref 26.0–34.0)
MCHC: 33.2 g/dL (ref 30.0–36.0)
MCV: 94.9 fL (ref 80.0–100.0)
Platelets: 233 10*3/uL (ref 150–400)
RBC: 4.09 MIL/uL — ABNORMAL LOW (ref 4.22–5.81)
RDW: 13.6 % (ref 11.5–15.5)
WBC: 10.1 10*3/uL (ref 4.0–10.5)
nRBC: 0 % (ref 0.0–0.2)

## 2019-09-27 LAB — BASIC METABOLIC PANEL
Anion gap: 11 (ref 5–15)
BUN: 17 mg/dL (ref 8–23)
CO2: 23 mmol/L (ref 22–32)
Calcium: 8.9 mg/dL (ref 8.9–10.3)
Chloride: 105 mmol/L (ref 98–111)
Creatinine, Ser: 1.56 mg/dL — ABNORMAL HIGH (ref 0.61–1.24)
GFR calc Af Amer: 48 mL/min — ABNORMAL LOW (ref >60)
GFR calc non Af Amer: 41 mL/min — ABNORMAL LOW (ref >60)
Glucose, Bld: 147 mg/dL — ABNORMAL HIGH (ref 70–99)
Potassium: 4.3 mmol/L (ref 3.5–5.1)
Sodium: 139 mmol/L (ref 135–145)

## 2019-09-27 LAB — URINALYSIS, ROUTINE W REFLEX MICROSCOPIC
Bacteria, UA: NONE SEEN
Bilirubin Urine: NEGATIVE
Glucose, UA: NEGATIVE mg/dL
Ketones, ur: NEGATIVE mg/dL
Leukocytes,Ua: NEGATIVE
Nitrite: NEGATIVE
Protein, ur: NEGATIVE mg/dL
Specific Gravity, Urine: 1.014 (ref 1.005–1.030)
pH: 5 (ref 5.0–8.0)

## 2019-09-27 NOTE — ED Notes (Signed)
Angie Stigler (Daughter-in-Law#[phone]) called/would like a call once patient is in room and seeing his Doctor.

## 2019-09-27 NOTE — Discharge Instructions (Addendum)
Take Tylenol or Motrin for pain.  Your blood pressure is elevated and please take your blood pressure medicine as prescribed.  The glue will fall off by itself in several days.  See your doctor for follow-up.  Return to ER if you have headache, vomiting, uncontrolled bleeding or severe pain.

## 2019-09-27 NOTE — ED Triage Notes (Signed)
Pt here from home after fall preceded by dizziness while using a leaf blower on his driveway. Pt felt dizzy and fell, c/o neck pain, lac forehead-bleeding controlled, and L ring finger abrasion. Denies blood thinners or LOC.

## 2019-09-27 NOTE — ED Notes (Signed)
Patient is being discharged from the Urgent Care and sent to the Emergency Department via POV with daughter as driver . Per Dr Lanny Cramp, patient is in need of higher level of care due to fall, head injury and neck pain. Patient is aware and verbalizes understanding of plan of care. There were no vitals filed for this visit.

## 2019-09-27 NOTE — ED Provider Notes (Signed)
[facility] EMERGENCY DEPARTMENT Provider Note   CSN: [id number] Arrival date & time: 09/27/19  1529     History Chief Complaint  Patient presents with  . Fall  . Head Injury    Scott Ward is a 79 y.o. male hx of COPD, DM, HL, here with fall.  Patient was using a leaf blower on his driveway and had some dizziness and fell and hit his forehead.  Also was noted to have a abrasion on the right fourth finger.  Patient's tetanus is up-to-date.  Patient denies passing out.  Denies any chest pain or shortness of breath.  The history is provided by the patient.       Past Medical History:  Diagnosis Date  . Arthritis    "lower back" (03/02/2016)  . CAP (community acquired pneumonia) 2018   Right Middle Lobe  . Colon polyp   . COPD (chronic obstructive pulmonary disease) ([place]) dx'd 03/02/2016  . Dermatochalasis   . Diabetes mellitus without complication ([place])   . Fatty liver   . High blood pressure   . High cholesterol   . History of BPH   . History of colon polyps   . HOH (hard of hearing)   . Nicotine abuse   . Right carotid bruit     Patient Active Problem List   Diagnosis Date Noted  . BPH with urinary obstruction 02/06/2019  . Need for prophylactic vaccination and inoculation against influenza 11/15/2017  . CAP (community acquired pneumonia) 03/02/2016  . Acute respiratory failure ([place]) 03/02/2016  . Acute exacerbation of chronic obstructive pulmonary disease (COPD) ([place]) 03/02/2016  . Essential hypertension 03/02/2016  . Ptosis 10/15/2013  . Blurred vision, bilateral 10/15/2013  . Weakness 10/15/2013  . Diabetes mellitus without complication [facility])     Past Surgical History:  Procedure Laterality Date  . BLEPHAROPLASTY Bilateral   . CATARACT EXTRACTION W/ INTRAOCULAR LENS  IMPLANT, BILATERAL    . COLONOSCOPY    . TRANSURETHRAL RESECTION OF PROSTATE N/A 02/06/2019   Procedure: TRANSURETHRAL RESECTION OF THE PROSTATE (TURP);  Surgeon: Irine Seal, MD;  Location: [facility];  Service: Urology;  Laterality: N/A;       Family History  Problem Relation Age of Onset  . Heart attack Father   . Heart attack Sister   . Lung cancer Sister     Social History   Tobacco Use  . Smoking status: Current Every Day Smoker    Packs/day: 1.00    Years: 60.00    Pack years: 60.00    Types: Cigarettes  . Smokeless tobacco: Never Used  Vaping Use  . Vaping Use: Never used  Substance Use Topics  . Alcohol use: Not Currently    Comment: 03/02/2016 "nothing since 1973"  . Drug use: No    Home Medications Prior to Admission medications   Medication Sig Start Date End Date Taking? Authorizing Provider  albuterol (VENTOLIN HFA) 108 (90 Base) MCG/ACT inhaler Inhale 2 puffs into the lungs every 6 (six) hours as needed for wheezing or shortness of breath. 04/30/19   Susy Frizzle, MD  aspirin 81 MG tablet Take 81 mg by mouth daily.      [provider]  finasteride (PROSCAR) 5 MG tablet Take 1 tablet (5 mg total) by mouth daily. 04/30/19   Susy Frizzle, MD  furosemide (LASIX) 40 MG tablet Take 1 tablet (40 mg total) by mouth daily as needed for edema. 08/23/19   Susy Frizzle, MD  KLOR-CON M20 20 MEQ tablet TAKE ONE TABLET BY MOUTH DAILY 09/17/19   Susy Frizzle, MD  lisinopril (ZESTRIL) 20 MG tablet TAKE ONE TABLET BY MOUTH DAILY 09/17/19   Susy Frizzle, MD  metFORMIN (GLUCOPHAGE) 500 MG tablet TAKE TWO TABLETS BY MOUTH TWICE A DAY WITH MEALS 05/07/19   Susy Frizzle, MD  simvastatin (ZOCOR) 20 MG tablet TAKE ONE TABLET BY MOUTH EVERY NIGHT AT BEDTIME 09/17/19   Susy Frizzle, MD    Allergies    Metformin and related  Review of Systems   Review of Systems  Skin: Positive for wound.  All other systems reviewed and are negative.   Physical Exam Updated Vital Signs BP (!) 206/93   Pulse 78   Temp (!) 97.5 F (36.4 C) (Oral)   Resp 22   Ht 5\' 11"  (1.803 m)   Wt 88.5 kg   SpO2 95%   BMI 27.21 kg/m    Physical Exam Vitals and nursing note reviewed.  Constitutional:      Appearance: Normal appearance.  HENT:     Head: Normocephalic.     Comments: 5 cm vertical laceration of the forehead.  There is an abrasion on the bridge of the nose as well as road rash on the left face with no bony tenderness.    Nose: Nose normal.     Mouth/Throat:     Mouth: Mucous membranes are moist.  Eyes:     Extraocular Movements: Extraocular movements intact.     Pupils: Pupils are equal, round, and reactive to light.  Cardiovascular:     Rate and Rhythm: Normal rate and regular rhythm.     Pulses: Normal pulses.     Heart sounds: Normal heart sounds.  Pulmonary:     Effort: Pulmonary effort is normal.     Breath sounds: Normal breath sounds.  Abdominal:     General: Abdomen is flat.     Palpations: Abdomen is soft.  Musculoskeletal:     Cervical back: Normal range of motion.     Comments: Skin tear in the right fourth distal phalanx.  No obvious bony tenderness or deformity.  Normal capillary refill.  No other signs of trauma to the hand.  Skin:    General: Skin is warm.     Capillary Refill: Capillary refill takes less than 2 seconds.  Neurological:     General: No focal deficit present.     Mental Status: He is alert and oriented to person, place, and time.  Psychiatric:        Mood and Affect: Mood normal.        Behavior: Behavior normal.     ED Results / Procedures / Treatments   Labs (all labs ordered are listed, but only abnormal results are displayed) Labs Reviewed  BASIC METABOLIC PANEL - Abnormal; Notable for the following components:      Result Value   Glucose, Bld 147 (*)    Creatinine, Ser 1.56 (*)    GFR calc non Af Amer 41 (*)    GFR calc Af Amer 48 (*)    All other components within normal limits  CBC - Abnormal; Notable for the following components:   RBC 4.09 (*)    Hemoglobin 12.9 (*)    HCT 38.8 (*)    All other components within normal limits  URINALYSIS,  ROUTINE W REFLEX MICROSCOPIC - Abnormal; Notable for the following components:   Hgb urine dipstick MODERATE (*)  All other components within normal limits  CBG MONITORING, ED    EKG EKG Interpretation  Date/Time:  Thursday September 27 2019 15:42:49 EDT Ventricular Rate:  80 PR Interval:  144 QRS Duration: 90 QT Interval:  386 QTC Calculation: 445 R Axis:   23 Text Interpretation: Normal sinus rhythm Normal ECG No significant change since last tracing Confirmed by Wandra Arthurs [phone]) on 09/27/2019 7:35:31 PM   Radiology CT Head Wo Contrast  Result Date: 09/27/2019 CLINICAL DATA:  79 year old male with dizziness.  Fall.  Pain. EXAM: CT HEAD WITHOUT CONTRAST TECHNIQUE: Contiguous axial images were obtained from the base of the skull through the vertex without intravenous contrast. COMPARISON:  None. FINDINGS: Brain: No midline shift, ventriculomegaly, mass effect, evidence of mass lesion, intracranial hemorrhage or evidence of cortically based acute infarction. Moderate bilateral Patchy and confluent cerebral white matter hypodensity. Mild to moderate heterogeneity in the bilateral deep gray nuclei. No cortical encephalomalacia identified. Vascular: Calcified atherosclerosis at the skull base. No suspicious intracranial vascular hyperdensity. Skull: No acute osseous abnormality identified. Sinuses/Orbits: Visualized paranasal sinuses and mastoids are clear. Other: Left forehead scalp hematoma and possible laceration. Soft tissue thickening up to 7 mm overlying the left frontal sinus, but the left frontal bone appears to remain intact. No other acute orbit or scalp soft tissue injury identified. IMPRESSION: 1. Left forehead scalp soft tissue injury with no underlying skull fracture. 2. No acute intracranial abnormality. Moderate for age cerebral white matter and deep gray matter changes, most commonly due to chronic small vessel disease. Electronically Signed   By: Genevie Ann M.D.   On: 09/27/2019  16:52   CT Cervical Spine Wo Contrast  Result Date: 09/27/2019 CLINICAL DATA:  79 year old male with dizziness. Fall. Pain. EXAM: CT CERVICAL SPINE WITHOUT CONTRAST TECHNIQUE: Multidetector CT imaging of the cervical spine was performed without intravenous contrast. Multiplanar CT image reconstructions were also generated. COMPARISON:  Head CT today. FINDINGS: Alignment: Mild straightening of cervical lordosis. Cervicothoracic junction alignment is within normal limits. Bilateral posterior element alignment is within normal limits. Skull base and vertebrae: Visualized skull base is intact. No atlanto-occipital dissociation. C1 and C2 appear intact and aligned. No acute osseous abnormality identified. Soft tissues and spinal canal: No prevertebral fluid or swelling. No visible canal hematoma. Retropharyngeal course of both carotids (normal variant). Bilateral carotid calcified plaque. Otherwise negative noncontrast visible neck soft tissues. Disc levels: Bulky mid and lower cervical spine endplate spurring. Bulky degenerative spurring at the anterior C1-C2 articulation. But relatively mild for age cervical spine degeneration otherwise. No significant spinal stenosis suspected. Upper chest: Visible upper thoracic levels appear intact. Benign vertebral body hemangioma suspected at L1 and L3. Negative lung apices. IMPRESSION: 1. No acute traumatic injury identified in the cervical spine. 2. Cervical spine degeneration but no spinal stenosis suspected. Electronically Signed   By: Genevie Ann M.D.   On: 09/27/2019 16:55    Procedures Procedures (including critical care time)  LACERATION REPAIR Performed by: Wandra Arthurs Authorized by: Wandra Arthurs Consent: Verbal consent obtained. Risks and benefits: risks, benefits and alternatives were discussed Consent given by: patient Patient identity confirmed: provided demographic data Prepped and Draped in normal sterile fashion Wound explored  Laceration Location:  forehead   Laceration Length: 5 cm  No Foreign Bodies seen or palpated  Anesthesia: none  Local anesthetic: none  Irrigation method: syringe Amount of cleaning: standard  Skin closure: dermabond   Patient tolerance: Patient tolerated the procedure well with no immediate complications.  LACERATION REPAIR Performed by: Wandra Arthurs Authorized by: Wandra Arthurs Consent: Verbal consent obtained. Risks and benefits: risks, benefits and alternatives were discussed Consent given by: patient Patient identity confirmed: provided demographic data Prepped and Draped in normal sterile fashion Wound explored  Laceration Location: R finger  Laceration Length: 2 cm  No Foreign Bodies seen or palpated  Anesthesia: none   Irrigation method: syringe Amount of cleaning: standard  Skin closure: dermabond   Patient tolerance: Patient tolerated the procedure well with no immediate complications.  Medications Ordered in ED Medications - No data to display  ED Course  I have reviewed the triage vital signs and the nursing notes.  Pertinent labs & imaging results that were available during my care of the patient were reviewed by me and considered in my medical decision making (see chart for details).    MDM Rules/Calculators/A&P                          Scott Ward is a 79 y.o. male here with fall.  Had a mechanical fall and has a laceration of the forehead.  Patient also has a skin tear on the right fourth finger.  CT head and neck were unremarkable.  I was able to Dermabond the laceration on the forehead.  He also has a skin tear in the right fourth finger without any bony tenderness.  Was able to Dermabond that and put a compression dressing on it as well.  Patient's tetanus is up-to-date.  At this point he is stable for discharge.  Gave strict return precautions.  Final Clinical Impression(s) / ED Diagnoses Final diagnoses:  None    Rx / DC Orders ED Discharge Orders     None       Drenda Freeze, MD 09/27/19 2032

## 2019-10-01 ENCOUNTER — Encounter: Payer: Self-pay | Admitting: Family Medicine

## 2019-10-05 ENCOUNTER — Other Ambulatory Visit: Payer: Self-pay

## 2019-10-05 ENCOUNTER — Ambulatory Visit (INDEPENDENT_AMBULATORY_CARE_PROVIDER_SITE_OTHER): Payer: Medicare HMO | Admitting: Family Medicine

## 2019-10-05 VITALS — BP 140/80 | HR 90 | Temp 96.2°F | Ht 71.0 in | Wt 202.0 lb

## 2019-10-05 DIAGNOSIS — N1832 Chronic kidney disease, stage 3b: Secondary | ICD-10-CM | POA: Diagnosis not present

## 2019-10-05 DIAGNOSIS — I10 Essential (primary) hypertension: Secondary | ICD-10-CM | POA: Diagnosis not present

## 2019-10-05 MED ORDER — LISINOPRIL 40 MG PO TABS: 40.0000 mg | ORAL_TABLET | Freq: Every day | ORAL | 3 refills | Status: DC

## 2019-10-05 NOTE — Progress Notes (Signed)
Subjective:    Patient ID: Scott Ward, male    DOB: 10-Mar-1940, 79 y.o.   MRN: [id number]  Patient is here today for follow-up.  He recently lost his balance and fell in the driveway striking his forehead on the concrete and also lacerating his fourth digit on his right hand.  The laceration is healing well simply by bandaging it.  It does not require stitches.  He had a CT scan in the emergency room that showed no intracranial hemorrhage.  He does have a dull headache which his son has been treating with Aleve.  Unfortunate the patient has chronic kidney disease.  I explained that he needs to avoid Aleve as this will exacerbate his chronic kidney disease.  While in the hospital the patient's blood pressure was elevated.  At home his blood pressure has been averaging in the 150-160/80 range.  I checked it myself and found it to be 154/84 which is consistent with the son's readings at home.  Past Medical History:  Diagnosis Date  . Arthritis    "lower back" (03/02/2016)  . CAP (community acquired pneumonia) 2018   Right Middle Lobe  . Colon polyp   . COPD (chronic obstructive pulmonary disease) ([place]) dx'd 03/02/2016  . Dermatochalasis   . Diabetes mellitus without complication ([place])   . Fatty liver   . High blood pressure   . High cholesterol   . History of BPH   . History of colon polyps   . HOH (hard of hearing)   . Nicotine abuse   . Right carotid bruit    Past Surgical History:  Procedure Laterality Date  . BLEPHAROPLASTY Bilateral   . CATARACT EXTRACTION W/ INTRAOCULAR LENS  IMPLANT, BILATERAL    . COLONOSCOPY    . TRANSURETHRAL RESECTION OF PROSTATE N/A 02/06/2019   Procedure: TRANSURETHRAL RESECTION OF THE PROSTATE (TURP);  Surgeon: Irine Seal, MD;  Location: [facility];  Service: Urology;  Laterality: N/A;   Current Outpatient Medications on File Prior to Visit  Medication Sig Dispense Refill  . albuterol (VENTOLIN HFA) 108 (90 Base) MCG/ACT inhaler Inhale 2 puffs into the lungs  every 6 (six) hours as needed for wheezing or shortness of breath. 18 g 1  . aspirin 81 MG tablet Take 81 mg by mouth daily.      . finasteride (PROSCAR) 5 MG tablet Take 1 tablet (5 mg total) by mouth daily. 90 tablet 3  . furosemide (LASIX) 40 MG tablet Take 1 tablet (40 mg total) by mouth daily as needed for edema. 90 tablet 1  . KLOR-CON M20 20 MEQ tablet TAKE ONE TABLET BY MOUTH DAILY 90 tablet 0  . lisinopril (ZESTRIL) 20 MG tablet TAKE ONE TABLET BY MOUTH DAILY 90 tablet 0  . metFORMIN (GLUCOPHAGE) 500 MG tablet TAKE TWO TABLETS BY MOUTH TWICE A DAY WITH MEALS 360 tablet 1  . simvastatin (ZOCOR) 20 MG tablet TAKE ONE TABLET BY MOUTH EVERY NIGHT AT BEDTIME 90 tablet 0   No current facility-administered medications on file prior to visit.    Allergies  Allergen Reactions  . Metformin And Related Other (See Comments)    Per pt gives him too much gas and refuses to take.   Social History   Socioeconomic History  . Marital status: Divorced    Spouse name: Not on file  . Number of children: 1  . Years of education: 8  . Highest education level: Not on file  Occupational History  . Occupation: RETIRED  Tobacco Use  . Smoking status: Current Every Day Smoker    Packs/day: 1.00    Years: 60.00    Pack years: 60.00    Types: Cigarettes  . Smokeless tobacco: Never Used  Vaping Use  . Vaping Use: Never used  Substance and Sexual Activity  . Alcohol use: Not Currently    Comment: 03/02/2016 "nothing since 1973"  . Drug use: No  . Sexual activity: Never  Other Topics Concern  . Not on file  Social History Narrative   Patient is single with one child.   Patient is left handed.   Patient has an 8 th grade education.   Patient drinks up to 3 cups daily.   Social Determinants of Health   Financial Resource Strain:   . Difficulty of Paying Living Expenses:   Food Insecurity:   . Worried About Charity fundraiser in the Last Year:   . Arboriculturist in the Last Year:     Transportation Needs:   . Film/video editor (Medical):   Marland Kitchen Lack of Transportation (Non-Medical):   Physical Activity:   . Days of Exercise per Week:   . Minutes of Exercise per Session:   Stress:   . Feeling of Stress :   Social Connections:   . Frequency of Communication with Friends and Family:   . Frequency of Social Gatherings with Friends and Family:   . Attends Religious Services:   . Active Member of Clubs or Organizations:   . Attends Archivist Meetings:   Marland Kitchen Marital Status:   Intimate Partner Violence:   . Fear of Current or Ex-Partner:   . Emotionally Abused:   Marland Kitchen Physically Abused:   . Sexually Abused:    Family History  Problem Relation Age of Onset  . Heart attack Father   . Heart attack Sister   . Lung cancer Sister       Review of Systems  All other systems reviewed and are negative.      Objective:   Physical Exam Vitals reviewed.  Constitutional:      General: He is not in acute distress.    Appearance: He is well-developed. He is not diaphoretic.  HENT:     Head: Normocephalic and atraumatic.     Right Ear: External ear normal.     Left Ear: External ear normal.     Nose: Nose normal.     Mouth/Throat:     Pharynx: No oropharyngeal exudate.  Eyes:     General: No scleral icterus.       Right eye: No discharge.        Left eye: No discharge.     Conjunctiva/sclera: Conjunctivae normal.     Pupils: Pupils are equal, round, and reactive to light.  Neck:     Vascular: No JVD.  Cardiovascular:     Rate and Rhythm: Normal rate and regular rhythm.     Heart sounds: Normal heart sounds. No murmur heard.  No friction rub. No gallop.   Pulmonary:     Effort: Pulmonary effort is normal. No respiratory distress.     Breath sounds: Wheezing present. No rales.  Chest:     Chest wall: No tenderness.  Abdominal:     General: Bowel sounds are normal. There is no distension.     Palpations: Abdomen is soft. There is no mass.      Tenderness: There is no abdominal tenderness. There is no guarding or rebound.  Musculoskeletal:        General: No tenderness or deformity. Normal range of motion.     Cervical back: Normal range of motion and neck supple.  Lymphadenopathy:     Cervical: No cervical adenopathy.  Skin:    General: Skin is warm.     Coloration: Skin is not pale.     Findings: No erythema or rash.  Neurological:     Mental Status: He is alert and oriented to person, place, and time.     Cranial Nerves: No cranial nerve deficit.     Motor: No abnormal muscle tone.     Coordination: Coordination normal.     Deep Tendon Reflexes: Reflexes are normal and symmetric.  Psychiatric:        Behavior: Behavior normal.        Thought Content: Thought content normal.        Judgment: Judgment normal.           Assessment & Plan:  Benign essential HTN  Stage 3b chronic kidney disease  Discontinue all NSAIDs immediately.  Increase lisinopril to 40 mg a day for hypertension.  Reviewed lab work from his recent ER visit on July 29.  Creatinine was 1.56.  Recheck blood pressure in [redacted] weeks along with a BMP to monitor kidney function.  Explained to the family that he can only have Tylenol for his headache

## 2019-11-01 ENCOUNTER — Encounter: Payer: Medicare HMO | Admitting: Family Medicine

## 2019-11-08 ENCOUNTER — Ambulatory Visit (INDEPENDENT_AMBULATORY_CARE_PROVIDER_SITE_OTHER): Payer: Medicare HMO | Admitting: Family Medicine

## 2019-11-08 ENCOUNTER — Other Ambulatory Visit: Payer: Self-pay

## 2019-11-08 VITALS — BP 140/70 | HR 92 | Temp 96.8°F | Ht 71.0 in | Wt 200.0 lb

## 2019-11-08 DIAGNOSIS — N1832 Chronic kidney disease, stage 3b: Secondary | ICD-10-CM

## 2019-11-08 DIAGNOSIS — E119 Type 2 diabetes mellitus without complications: Secondary | ICD-10-CM

## 2019-11-08 DIAGNOSIS — Z0001 Encounter for general adult medical examination with abnormal findings: Secondary | ICD-10-CM | POA: Diagnosis not present

## 2019-11-08 DIAGNOSIS — J438 Other emphysema: Secondary | ICD-10-CM

## 2019-11-08 DIAGNOSIS — Z0000 Encounter for general adult medical examination without abnormal findings: Secondary | ICD-10-CM

## 2019-11-08 DIAGNOSIS — Z23 Encounter for immunization: Secondary | ICD-10-CM | POA: Diagnosis not present

## 2019-11-08 DIAGNOSIS — I10 Essential (primary) hypertension: Secondary | ICD-10-CM

## 2019-11-08 DIAGNOSIS — E7800 Pure hypercholesterolemia, unspecified: Secondary | ICD-10-CM

## 2019-11-08 NOTE — Progress Notes (Signed)
Subjective:    Patient ID: Scott Ward, male    DOB: [birth year], 79 y.o.   MRN: [id number]  Patient is a very pleasant 79 year old Caucasian male here today with his son for complete physical exam.  Last year he was complaining of pain in his legs.  We performed arterial Dopplers that showed no significant peripheral vascular disease.  Today he complains of an aching heaviness in both legs from his hips to his feet.  They are aching and sore.  He also reports weakness in the muscles of his legs.  He denies any numbness or tingling.  He denies any burning pain.  Differential diagnosis would be statin induced myopathy versus peripheral neuropathy.  The patient would like to try holding his statin for a few weeks to see if it would help with the pain.  If not I would treat the pain is peripheral neuropathy.  He denies any falls.  He denies any depression.  He does have some mild age-related memory loss.  His son helps supervise his medication and care for him.  The patient is no longer driving.  He had his Covid shot.  His last Covid shot was April 5.  He is due for booster on his Covid shot in December.  He is also due for a flu shot.  The remainder of his immunizations are up-to-date.  Due to his age I would not recommend colon cancer screening or prostate cancer screening.  He continues to smoke.  He has bilateral expiratory wheezing however he denies any shortness of breath.  He seldom has to use his albuterol, mainly when it is hot and humid outside. Immunization History  Administered Date(s) Administered  . Fluad Quad(high Dose 65+) 10/26/2018  . Influenza Whole 12/22/2007, 12/25/2008, 01/07/2010  . Influenza, High Dose Seasonal PF 01/04/2017  . Influenza,inj,Quad PF,6+ Mos 01/18/2013, 05/17/2014, 11/22/2014, 03/03/2016, 11/15/2017  . Pneumococcal Conjugate-13 01/18/2013  . Pneumococcal Polysaccharide-23 03/10/2004, 05/20/2014  . Td 03/10/2004    Past Medical History:  Diagnosis Date  .  Arthritis    "lower back" (03/02/2016)  . CAP (community acquired pneumonia) 2018   Right Middle Lobe  . Colon polyp   . COPD (chronic obstructive pulmonary disease) ([place]) dx'd 03/02/2016  . Dermatochalasis   . Diabetes mellitus without complication ([place])   . Fatty liver   . High blood pressure   . High cholesterol   . History of BPH   . History of colon polyps   . HOH (hard of hearing)   . Nicotine abuse   . Right carotid bruit    Past Surgical History:  Procedure Laterality Date  . BLEPHAROPLASTY Bilateral   . CATARACT EXTRACTION W/ INTRAOCULAR LENS  IMPLANT, BILATERAL    . COLONOSCOPY    . TRANSURETHRAL RESECTION OF PROSTATE N/A 02/06/2019   Procedure: TRANSURETHRAL RESECTION OF THE PROSTATE (TURP);  Surgeon: Irine Seal, MD;  Location: [facility];  Service: Urology;  Laterality: N/A;   Current Outpatient Medications on File Prior to Visit  Medication Sig Dispense Refill  . albuterol (VENTOLIN HFA) 108 (90 Base) MCG/ACT inhaler Inhale 2 puffs into the lungs every 6 (six) hours as needed for wheezing or shortness of breath. 18 g 1  . aspirin 81 MG tablet Take 81 mg by mouth daily.      . finasteride (PROSCAR) 5 MG tablet Take 1 tablet (5 mg total) by mouth daily. 90 tablet 3  . furosemide (LASIX) 40 MG tablet Take 1 tablet (40 mg total)  by mouth daily as needed for edema. 90 tablet 1  . KLOR-CON M20 20 MEQ tablet TAKE ONE TABLET BY MOUTH DAILY 90 tablet 0  . lisinopril (ZESTRIL) 20 MG tablet TAKE ONE TABLET BY MOUTH DAILY 90 tablet 0  . lisinopril (ZESTRIL) 40 MG tablet Take 1 tablet (40 mg total) by mouth daily. 90 tablet 3  . metFORMIN (GLUCOPHAGE) 500 MG tablet TAKE TWO TABLETS BY MOUTH TWICE A DAY WITH MEALS 360 tablet 1  . simvastatin (ZOCOR) 20 MG tablet TAKE ONE TABLET BY MOUTH EVERY NIGHT AT BEDTIME 90 tablet 0   No current facility-administered medications on file prior to visit.   Allergies  Allergen Reactions  . Metformin And Related Other (See Comments)    Per pt gives  him too much gas and refuses to take.   Social History   Socioeconomic History  . Marital status: Divorced    Spouse name: Not on file  . Number of children: 1  . Years of education: 8  . Highest education level: Not on file  Occupational History  . Occupation: RETIRED  Tobacco Use  . Smoking status: Current Every Day Smoker    Packs/day: 1.00    Years: 60.00    Pack years: 60.00    Types: Cigarettes  . Smokeless tobacco: Never Used  Vaping Use  . Vaping Use: Never used  Substance and Sexual Activity  . Alcohol use: Not Currently    Comment: 03/02/2016 "nothing since 1973"  . Drug use: No  . Sexual activity: Never  Other Topics Concern  . Not on file  Social History Narrative   Patient is single with one child.   Patient is left handed.   Patient has an 8 th grade education.   Patient drinks up to 3 cups daily.   Social Determinants of Health   Financial Resource Strain:   . Difficulty of Paying Living Expenses: Not on file  Food Insecurity:   . Worried About Charity fundraiser in the Last Year: Not on file  . Ran Out of Food in the Last Year: Not on file  Transportation Needs:   . Lack of Transportation (Medical): Not on file  . Lack of Transportation (Non-Medical): Not on file  Physical Activity:   . Days of Exercise per Week: Not on file  . Minutes of Exercise per Session: Not on file  Stress:   . Feeling of Stress : Not on file  Social Connections:   . Frequency of Communication with Friends and Family: Not on file  . Frequency of Social Gatherings with Friends and Family: Not on file  . Attends Religious Services: Not on file  . Active Member of Clubs or Organizations: Not on file  . Attends Archivist Meetings: Not on file  . Marital Status: Not on file  Intimate Partner Violence:   . Fear of Current or Ex-Partner: Not on file  . Emotionally Abused: Not on file  . Physically Abused: Not on file  . Sexually Abused: Not on file   Family  History  Problem Relation Age of Onset  . Heart attack Father   . Heart attack Sister   . Lung cancer Sister       Review of Systems  All other systems reviewed and are negative.      Objective:   Physical Exam Vitals reviewed.  Constitutional:      General: He is not in acute distress.    Appearance: He is well-developed.  He is not diaphoretic.  HENT:     Head: Normocephalic and atraumatic.     Right Ear: External ear normal.     Left Ear: External ear normal.     Nose: Nose normal.     Mouth/Throat:     Pharynx: No oropharyngeal exudate.  Eyes:     General: No scleral icterus.       Right eye: No discharge.        Left eye: No discharge.     Conjunctiva/sclera: Conjunctivae normal.     Pupils: Pupils are equal, round, and reactive to light.  Neck:     Vascular: No JVD.  Cardiovascular:     Rate and Rhythm: Normal rate and regular rhythm.     Heart sounds: Normal heart sounds. No murmur heard.  No friction rub. No gallop.   Pulmonary:     Effort: Pulmonary effort is normal. No respiratory distress.     Breath sounds: Wheezing present. No rales.  Chest:     Chest wall: No tenderness.  Abdominal:     General: Bowel sounds are normal. There is no distension.     Palpations: Abdomen is soft. There is no mass.     Tenderness: There is no abdominal tenderness. There is no guarding or rebound.  Musculoskeletal:        General: No tenderness or deformity. Normal range of motion.     Cervical back: Normal range of motion and neck supple.  Lymphadenopathy:     Cervical: No cervical adenopathy.  Skin:    General: Skin is warm.     Coloration: Skin is not pale.     Findings: No erythema or rash.  Neurological:     Mental Status: He is alert and oriented to person, place, and time.     Cranial Nerves: No cranial nerve deficit.     Motor: No abnormal muscle tone.     Coordination: Coordination normal.     Deep Tendon Reflexes: Reflexes are normal and symmetric.   Psychiatric:        Behavior: Behavior normal.        Thought Content: Thought content normal.        Judgment: Judgment normal.           Assessment & Plan:   Controlled type 2 diabetes mellitus without complication, without long-term current use of insulin ([facility]CC) - Plan: Hemoglobin A1c, COMPLETE METABOLIC PANEL WITH GFR  Benign essential HTN  Stage 3b chronic kidney disease  Pure hypercholesterolemia  Other emphysema ([place])  General medical exam  I believe the patient's leg pain is likely statin induced myopathy versus peripheral neuropathy.  We will try holding his simvastatin for 3 weeks and see if the pain improves.  If not I would resume the simvastatin and treat the patient for peripheral neuropathy.  His son has been checking his blood pressures frequently and it is consistently 145/84.  Given his age I am comfortable with this blood pressure.  He is on a higher dose of lisinopril and he is having to use the Lasix every other day.  Therefore I would like to recheck his renal function.  It was 1.56 in July.  As long as it is less than 1.8 I will make no changes.  I will also check a hemoglobin A1c.  Goal hemoglobin A1c is less than 7.  Recommended a booster on his Covid shot in December.  The patient received his flu shot today.  Regular anticipatory guidance  is provided

## 2019-11-09 LAB — COMPLETE METABOLIC PANEL WITH GFR
AG Ratio: 1.8 (calc) (ref 1.0–2.5)
ALT: 9 U/L (ref 9–46)
AST: 12 U/L (ref 10–35)
Albumin: 4 g/dL (ref 3.6–5.1)
Alkaline phosphatase (APISO): 86 U/L (ref 35–144)
BUN/Creatinine Ratio: 15 (calc) (ref 6–22)
BUN: 29 mg/dL — ABNORMAL HIGH (ref 7–25)
CO2: 26 mmol/L (ref 20–32)
Calcium: 9.3 mg/dL (ref 8.6–10.3)
Chloride: 108 mmol/L (ref 98–110)
Creat: 1.89 mg/dL — ABNORMAL HIGH (ref 0.70–1.11)
GFR, Est African American: 38 mL/min/{1.73_m2} — ABNORMAL LOW (ref >=60)
GFR, Est Non African American: 33 mL/min/{1.73_m2} — ABNORMAL LOW (ref >=60)
Globulin: 2.2 g/dL (calc) (ref 1.9–3.7)
Glucose, Bld: 115 mg/dL — ABNORMAL HIGH (ref 65–99)
Potassium: 4.8 mmol/L (ref 3.5–5.3)
Sodium: 142 mmol/L (ref 135–146)
Total Bilirubin: 0.2 mg/dL (ref 0.2–1.2)
Total Protein: 6.2 g/dL (ref 6.1–8.1)

## 2019-11-09 LAB — HEMOGLOBIN A1C
Hgb A1c MFr Bld: 6.1 % of total Hgb — ABNORMAL HIGH (ref <5.7)
Mean Plasma Glucose: 128 (calc)
eAG (mmol/L): 7.1 (calc)

## 2019-12-03 ENCOUNTER — Encounter (INDEPENDENT_AMBULATORY_CARE_PROVIDER_SITE_OTHER): Payer: Self-pay | Admitting: Ophthalmology

## 2019-12-03 ENCOUNTER — Other Ambulatory Visit: Payer: Self-pay

## 2019-12-03 ENCOUNTER — Ambulatory Visit (INDEPENDENT_AMBULATORY_CARE_PROVIDER_SITE_OTHER): Payer: Medicare HMO | Admitting: Ophthalmology

## 2019-12-03 DIAGNOSIS — H43811 Vitreous degeneration, right eye: Secondary | ICD-10-CM | POA: Diagnosis not present

## 2019-12-03 DIAGNOSIS — Z961 Presence of intraocular lens: Secondary | ICD-10-CM | POA: Diagnosis not present

## 2019-12-03 DIAGNOSIS — E119 Type 2 diabetes mellitus without complications: Secondary | ICD-10-CM

## 2019-12-03 DIAGNOSIS — H43812 Vitreous degeneration, left eye: Secondary | ICD-10-CM

## 2019-12-03 DIAGNOSIS — H353131 Nonexudative age-related macular degeneration, bilateral, early dry stage: Secondary | ICD-10-CM | POA: Insufficient documentation

## 2019-12-03 NOTE — Patient Instructions (Signed)
Patient asked to report promptly the office new onset visual acuity distortion or decline in vision

## 2019-12-03 NOTE — Assessment & Plan Note (Signed)
No detectable diabetic retinopathy in either eye, stable at 2-year follow-up

## 2019-12-03 NOTE — Assessment & Plan Note (Signed)
The nature of posterior vitreous detachment was discussed with the patient as well as its physiology, its age prevalence, and its possible implication regarding retinal breaks and detachment.  An informational brochure was given to the patient.  All the patient's questions were answered.  The patient was asked to return if new or different flashes or floaters develops.   Patient was instructed to contact office immediately if any changes were noticed. I explained to the patient that vitreous inside the eye is similar to jello inside a bowl. As the jello melts it can start to pull away from the bowl, similarly the vitreous throughout our lives can begin to pull away from the retina. That process is called a posterior vitreous detachment. In some cases, the vitreous can tug hard enough on the retina to form a retinal tear. I discussed with the patient the signs and symptoms of a retinal detachment.  Do not rub the eye.  OU, minor and stable

## 2019-12-03 NOTE — Progress Notes (Signed)
12/03/2019     CHIEF COMPLAINT Patient presents for Retina Follow Up   HISTORY OF PRESENT ILLNESS: Scott Ward is a 79 y.o. male who presents to the clinic today for:   HPI    Retina Follow Up    Patient presents with  Diabetic Retinopathy.  In both eyes.  This started 2 years ago.  Severity is mild.  Duration of 2 years.  Since onset it is stable.          Comments    2 Year Diabetic F/U OU  Pt c/o intermittent film over OD, but sts when he uses water to wash it away, it goes away. Pt wants to know if there are any eye drops he can use for the film over OD. OS stable. A1c: 6.7, 10/2019 LBS: 123 2 weeks ago         Last edited by Rockie Neighbours, [employer] on 12/03/2019  8:36 AM. (History)      Referring physician: Susy Frizzle, MD [address] [place],  [place] [address]  HISTORICAL INFORMATION:   Selected notes from the MEDICAL RECORD NUMBER    Lab Results  Component Value Date   HGBA1C 6.1 (H) 11/08/2019     CURRENT MEDICATIONS: No current outpatient medications on file. (Ophthalmic Drugs)   No current facility-administered medications for this visit. (Ophthalmic Drugs)   Current Outpatient Medications (Other)  Medication Sig  . albuterol (VENTOLIN HFA) 108 (90 Base) MCG/ACT inhaler Inhale 2 puffs into the lungs every 6 (six) hours as needed for wheezing or shortness of breath.  Marland Kitchen aspirin 81 MG tablet Take 81 mg by mouth daily.    . finasteride (PROSCAR) 5 MG tablet Take 1 tablet (5 mg total) by mouth daily.  . furosemide (LASIX) 40 MG tablet Take 1 tablet (40 mg total) by mouth daily as needed for edema.  Marland Kitchen KLOR-CON M20 20 MEQ tablet TAKE ONE TABLET BY MOUTH DAILY  . lisinopril (ZESTRIL) 20 MG tablet TAKE ONE TABLET BY MOUTH DAILY  . lisinopril (ZESTRIL) 40 MG tablet Take 1 tablet (40 mg total) by mouth daily.  . metFORMIN (GLUCOPHAGE) 500 MG tablet TAKE TWO TABLETS BY MOUTH TWICE A DAY WITH MEALS  . simvastatin (ZOCOR) 20 MG tablet TAKE ONE TABLET  BY MOUTH EVERY NIGHT AT BEDTIME   No current facility-administered medications for this visit. (Other)      REVIEW OF SYSTEMS:    ALLERGIES Allergies  Allergen Reactions  . Metformin And Related Other (See Comments)    Per pt gives him too much gas and refuses to take.    PAST MEDICAL HISTORY Past Medical History:  Diagnosis Date  . Arthritis    "lower back" (03/02/2016)  . CAP (community acquired pneumonia) 2018   Right Middle Lobe  . Colon polyp   . COPD (chronic obstructive pulmonary disease) ([place]) dx'd 03/02/2016  . Dermatochalasis   . Diabetes mellitus without complication ([place])   . Fatty liver   . High blood pressure   . High cholesterol   . History of BPH   . History of colon polyps   . HOH (hard of hearing)   . Nicotine abuse   . Right carotid bruit    Past Surgical History:  Procedure Laterality Date  . BLEPHAROPLASTY Bilateral   . CATARACT EXTRACTION W/ INTRAOCULAR LENS  IMPLANT, BILATERAL    . COLONOSCOPY    . TRANSURETHRAL RESECTION OF PROSTATE N/A 02/06/2019   Procedure: TRANSURETHRAL RESECTION  OF THE PROSTATE (TURP);  Surgeon: Irine Seal, MD;  Location: [facility];  Service: Urology;  Laterality: N/A;    FAMILY HISTORY Family History  Problem Relation Age of Onset  . Heart attack Father   . Heart attack Sister   . Lung cancer Sister     SOCIAL HISTORY Social History   Tobacco Use  . Smoking status: Current Every Day Smoker    Packs/day: 1.00    Years: 60.00    Pack years: 60.00    Types: Cigarettes  . Smokeless tobacco: Never Used  Vaping Use  . Vaping Use: Never used  Substance Use Topics  . Alcohol use: Not Currently    Comment: 03/02/2016 "nothing since 1973"  . Drug use: No         OPHTHALMIC EXAM:  Base Eye Exam    Visual Acuity (ETDRS)      Right Left   Dist [place] 20/40 -1 20/50 -2   Dist ph [place] NI 20/40 -1  Pt did not bring glasses today       Tonometry (Tonopen, 8:32 AM)      Right Left   Pressure 17 13       Pupils        Pupils Dark Light Shape React APD   Right PERRL 4 3 Round Brisk None   Left PERRL 4 3 Round Brisk None       Visual Fields (Counting fingers)      Left Right    Full Full       Extraocular Movement      Right Left    Full Full       Neuro/Psych    Oriented x3: Yes   Mood/Affect: Normal       Dilation    Both eyes: 1.0% Mydriacyl, 2.5% Phenylephrine @ 8:36 AM        Slit Lamp and Fundus Exam    External Exam      Right Left   External Normal Normal       Slit Lamp Exam      Right Left   Lids/Lashes Normal Normal   Conjunctiva/Sclera White and quiet White and quiet   Cornea Clear Clear   Anterior Chamber Deep and quiet Deep and quiet   Iris Round and reactive Round and reactive   Lens Centered posterior chamber intraocular lens Centered posterior chamber intraocular lens   Anterior Vitreous Normal Normal       Fundus Exam      Right Left   Posterior Vitreous Posterior vitreous detachment Posterior vitreous detachment   Disc Normal Normal   C/D Ratio 0.4 0.4   Macula Hard drusen, Early age related macular degeneration Hard drusen, Early age related macular degeneration   Vessels no DR no DR   Periphery Normal Normal          IMAGING AND PROCEDURES  Imaging and Procedures for 12/03/19           ASSESSMENT/PLAN:  Diabetes mellitus without complication (HCC) No detectable diabetic retinopathy in either eye, stable at 2-year follow-up  Posterior vitreous detachment of right eye   The nature of posterior vitreous detachment was discussed with the patient as well as its physiology, its age prevalence, and its possible implication regarding retinal breaks and detachment.  An informational brochure was given to the patient.  All the patient's questions were answered.  The patient was asked to return if new or different flashes or floaters develops.   Patient  was instructed to contact office immediately if any changes were noticed. I explained to the  patient that vitreous inside the eye is similar to jello inside a bowl. As the jello melts it can start to pull away from the bowl, similarly the vitreous throughout our lives can begin to pull away from the retina. That process is called a posterior vitreous detachment. In some cases, the vitreous can tug hard enough on the retina to form a retinal tear. I discussed with the patient the signs and symptoms of a retinal detachment.  Do not rub the eye.  OU, minor and stable      ICD-10-CM   1. Diabetes mellitus without complication (HCC)  Y50.3   2. Posterior vitreous detachment of left eye  H43.812   3. Early stage nonexudative age-related macular degeneration of both eyes  H35.3131   4. Pseudophakia  Z96.1   5. Posterior vitreous detachment of right eye  H43.811     1.  2.  3.  Ophthalmic Meds Ordered this visit:  No orders of the defined types were placed in this encounter.      Return in about 2 years (around 12/02/2021) for DILATE OU, OCT.  Patient Instructions  Patient asked to report promptly the office new onset visual acuity distortion or decline in vision    Explained the diagnoses, plan, and follow up with the patient and they expressed understanding.  Patient expressed understanding of the importance of proper follow up care.   Clent Demark Nicky Kras M.D. Diseases & Surgery of the Retina and Vitreous Retina & Diabetic [place] 12/03/19     Abbreviations: M myopia (nearsighted); A astigmatism; H hyperopia (farsighted); P presbyopia; Mrx spectacle prescription;  CTL contact lenses; OD right eye; OS left eye; OU both eyes  XT exotropia; ET esotropia; PEK punctate epithelial keratitis; PEE punctate epithelial erosions; DES dry eye syndrome; MGD meibomian gland dysfunction; ATs artificial tears; PFAT's preservative free artificial tears; [place] nuclear sclerotic cataract; PSC posterior subcapsular cataract; ERM epi-retinal membrane; PVD posterior vitreous detachment; RD retinal  detachment; DM diabetes mellitus; DR diabetic retinopathy; NPDR non-proliferative diabetic retinopathy; PDR proliferative diabetic retinopathy; CSME clinically significant macular edema; DME diabetic macular edema; dbh dot blot hemorrhages; CWS cotton wool spot; POAG primary open angle glaucoma; C/D cup-to-disc ratio; HVF humphrey visual field; GVF goldmann visual field; OCT optical coherence tomography; IOP intraocular pressure; BRVO Branch retinal vein occlusion; CRVO central retinal vein occlusion; CRAO central retinal artery occlusion; BRAO branch retinal artery occlusion; RT retinal tear; SB scleral buckle; PPV pars plana vitrectomy; VH Vitreous hemorrhage; PRP panretinal laser photocoagulation; IVK intravitreal kenalog; VMT vitreomacular traction; MH Macular hole;  NVD neovascularization of the disc; NVE neovascularization elsewhere; AREDS age related eye disease study; ARMD age related macular degeneration; POAG primary open angle glaucoma; EBMD epithelial/anterior basement membrane dystrophy; ACIOL anterior chamber intraocular lens; IOL intraocular lens; PCIOL posterior chamber intraocular lens; Phaco/IOL phacoemulsification with intraocular lens placement; [place] photorefractive keratectomy; LASIK laser assisted in situ keratomileusis; HTN hypertension; DM diabetes mellitus; COPD chronic obstructive pulmonary disease

## 2019-12-18 ENCOUNTER — Other Ambulatory Visit: Payer: Self-pay | Admitting: Family Medicine

## 2019-12-18 DIAGNOSIS — M7989 Other specified soft tissue disorders: Secondary | ICD-10-CM

## 2019-12-18 MED ORDER — SIMVASTATIN 20 MG PO TABS: 20.0000 mg | ORAL_TABLET | Freq: Every day | ORAL | 1 refills | Status: DC

## 2019-12-18 MED ORDER — METFORMIN HCL 500 MG PO TABS: ORAL_TABLET | ORAL | 1 refills | Status: DC

## 2019-12-18 MED ORDER — POTASSIUM CHLORIDE CRYS ER 20 MEQ PO TBCR: 20.0000 meq | EXTENDED_RELEASE_TABLET | Freq: Every day | ORAL | 1 refills | Status: DC

## 2019-12-18 NOTE — Telephone Encounter (Signed)
Received fax from [facility] requesting a refill on KLOR-CON M20 20 MEQ tablet      Sig: TAKE ONE TABLET BY MOUTH DAILY   Qty: 90

## 2019-12-18 NOTE — Telephone Encounter (Signed)
Prescription sent to pharmacy.

## 2019-12-18 NOTE — Telephone Encounter (Signed)
simvastatin (ZOCOR) 20 MG tablet Sig: TAKE ONE TABLET BY MOUTH EVERY NIGHT AT BEDTIME Qty: 90  metFORMIN (GLUCOPHAGE) 500 MG tablet Sig: TAKE TWO TABLETS BY MOUTH TWICE A DAY WITH MEALS QTY: 360

## 2020-04-29 ENCOUNTER — Other Ambulatory Visit: Payer: Self-pay | Admitting: *Deleted

## 2020-04-29 MED ORDER — FINASTERIDE 5 MG PO TABS: 5.0000 mg | ORAL_TABLET | Freq: Every day | ORAL | 3 refills | Status: DC

## 2020-05-05 ENCOUNTER — Other Ambulatory Visit: Payer: Self-pay

## 2020-05-05 ENCOUNTER — Ambulatory Visit (INDEPENDENT_AMBULATORY_CARE_PROVIDER_SITE_OTHER): Payer: Medicare HMO | Admitting: Family Medicine

## 2020-05-05 VITALS — BP 142/90 | HR 93 | Temp 98.9°F | Resp 18 | Wt 201.0 lb

## 2020-05-05 DIAGNOSIS — E119 Type 2 diabetes mellitus without complications: Secondary | ICD-10-CM | POA: Diagnosis not present

## 2020-05-05 DIAGNOSIS — E7800 Pure hypercholesterolemia, unspecified: Secondary | ICD-10-CM

## 2020-05-05 DIAGNOSIS — J438 Other emphysema: Secondary | ICD-10-CM

## 2020-05-05 DIAGNOSIS — I10 Essential (primary) hypertension: Secondary | ICD-10-CM | POA: Diagnosis not present

## 2020-05-05 DIAGNOSIS — N1832 Chronic kidney disease, stage 3b: Secondary | ICD-10-CM

## 2020-05-05 MED ORDER — TRELEGY ELLIPTA 100-62.5-25 MCG/INH IN AEPB: 1.0000 | INHALATION_SPRAY | Freq: Every day | RESPIRATORY_TRACT | 11 refills | Status: DC

## 2020-05-05 MED ORDER — ALBUTEROL SULFATE HFA 108 (90 BASE) MCG/ACT IN AERS: 2.0000 | INHALATION_SPRAY | Freq: Four times a day (QID) | RESPIRATORY_TRACT | 1 refills | Status: DC | PRN

## 2020-05-05 MED ORDER — PREDNISONE 20 MG PO TABS
ORAL_TABLET | ORAL | 0 refills | Status: DC
Start: 2020-05-05 — End: 2020-06-26

## 2020-05-05 NOTE — Progress Notes (Signed)
Subjective:    Patient ID: Scott Ward, male    DOB: [birth year], 80 y.o.   MRN: [id number]  Patient is here today with his son for a checkup.  However I became very concerned after simply checking his vital signs.  Oxygen saturation is 89 to 90% on room air.  He has a markedly diminished breath sounds all throughout and diffuse expiratory wheezing.  He is only used his albuterol once or twice in the last week.  He states that he does not feel short of breath however his son admits that he is very sedentary and easily gets winded.  He denies any chest pain.  He denies any orthopnea.  There is no peripheral edema on his exam.  His legs are nonswollen and I do not appreciate any crackles in his lungs.  However his wheezing is more than his baseline and he seems to be developing a COPD exacerbation.  He is not on any maintenance medication and he smokes about 1 pack of cigarettes a day.  Since I last saw the patient, his son has had to increase his Lasix to 40 mg every day to help control the swelling.  His son also feels that his A1c will be elevated as his blood sugars have been consistently higher at home.  Blood pressure today is borderline however he denies any chest pain.  He is complaining of burning stinging pain in both feet.  He also complains of symptoms of neurogenic claudication.  He reports aching pain in his thighs and in his calves when he is walking that improves when he is resting.  I performed vascular ultrasound in 2020 that showed no significant signs of arterial occlusion.  He had normal ABIs.  Therefore I feel that most of this is neurogenic claudication.  He also reports itching sensation all over his back although he has no visible rash which I believe is likely also neuropathic in nature Immunization History  Administered Date(s) Administered  . Fluad Quad(high Dose 65+) 10/26/2018, 11/08/2019  . Influenza Whole 12/22/2007, 12/25/2008, 01/07/2010  . Influenza, High Dose Seasonal PF  01/04/2017  . Influenza,inj,Quad PF,6+ Mos 01/18/2013, 05/17/2014, 11/22/2014, 03/03/2016, 11/15/2017  . Pneumococcal Conjugate-13 01/18/2013  . Pneumococcal Polysaccharide-23 03/10/2004, 05/20/2014  . Td 03/10/2004    Past Medical History:  Diagnosis Date  . Arthritis    "lower back" (03/02/2016)  . CAP (community acquired pneumonia) 2018   Right Middle Lobe  . Colon polyp   . COPD (chronic obstructive pulmonary disease) ([place]) dx'd 03/02/2016  . Dermatochalasis   . Diabetes mellitus without complication ([place])   . Fatty liver   . High blood pressure   . High cholesterol   . History of BPH   . History of colon polyps   . HOH (hard of hearing)   . Nicotine abuse   . Right carotid bruit    Past Surgical History:  Procedure Laterality Date  . BLEPHAROPLASTY Bilateral   . CATARACT EXTRACTION W/ INTRAOCULAR LENS  IMPLANT, BILATERAL    . COLONOSCOPY    . TRANSURETHRAL RESECTION OF PROSTATE N/A 02/06/2019   Procedure: TRANSURETHRAL RESECTION OF THE PROSTATE (TURP);  Surgeon: Irine Seal, MD;  Location: [facility];  Service: Urology;  Laterality: N/A;   Current Outpatient Medications on File Prior to Visit  Medication Sig Dispense Refill  . albuterol (VENTOLIN HFA) 108 (90 Base) MCG/ACT inhaler Inhale 2 puffs into the lungs every 6 (six) hours as needed for wheezing or shortness of breath. [place]  g 1  . aspirin 81 MG tablet Take 81 mg by mouth daily.      . finasteride (PROSCAR) 5 MG tablet Take 1 tablet (5 mg total) by mouth daily. 90 tablet 3  . furosemide (LASIX) 40 MG tablet Take 1 tablet (40 mg total) by mouth daily as needed for edema. 90 tablet 1  . lisinopril (ZESTRIL) 20 MG tablet TAKE ONE TABLET BY MOUTH DAILY 90 tablet 0  . lisinopril (ZESTRIL) 40 MG tablet Take 1 tablet (40 mg total) by mouth daily. 90 tablet 3  . metFORMIN (GLUCOPHAGE) 500 MG tablet TAKE TWO TABLETS BY MOUTH TWICE A DAY WITH MEALS 360 tablet 1  . potassium chloride SA (KLOR-CON M20) 20 MEQ tablet Take 1 tablet (20  mEq total) by mouth daily. 90 tablet 1  . simvastatin (ZOCOR) 20 MG tablet Take 1 tablet (20 mg total) by mouth at bedtime. 90 tablet 1   No current facility-administered medications on file prior to visit.   Allergies  Allergen Reactions  . Metformin And Related Other (See Comments)    Per pt gives him too much gas and refuses to take.   Social History   Socioeconomic History  . Marital status: Divorced    Spouse name: Not on file  . Number of children: 1  . Years of education: 8  . Highest education level: Not on file  Occupational History  . Occupation: RETIRED  Tobacco Use  . Smoking status: Current Every Day Smoker    Packs/day: 1.00    Years: 60.00    Pack years: 60.00    Types: Cigarettes  . Smokeless tobacco: Never Used  Vaping Use  . Vaping Use: Never used  Substance and Sexual Activity  . Alcohol use: Not Currently    Comment: 03/02/2016 "nothing since 1973"  . Drug use: No  . Sexual activity: Never  Other Topics Concern  . Not on file  Social History Narrative   Patient is single with one child.   Patient is left handed.   Patient has an 8 th grade education.   Patient drinks up to 3 cups daily.   Social Determinants of Health   Financial Resource Strain: Not on file  Food Insecurity: Not on file  Transportation Needs: Not on file  Physical Activity: Not on file  Stress: Not on file  Social Connections: Not on file  Intimate Partner Violence: Not on file   Family History  Problem Relation Age of Onset  . Heart attack Father   . Heart attack Sister   . Lung cancer Sister       Review of Systems  All other systems reviewed and are negative.      Objective:   Physical Exam Vitals reviewed.  Constitutional:      General: He is not in acute distress.    Appearance: He is well-developed. He is not diaphoretic.  HENT:     Head: Normocephalic and atraumatic.     Right Ear: External ear normal.     Left Ear: External ear normal.     Nose:  Nose normal.     Mouth/Throat:     Pharynx: No oropharyngeal exudate.  Eyes:     General: No scleral icterus.       Right eye: No discharge.        Left eye: No discharge.     Conjunctiva/sclera: Conjunctivae normal.     Pupils: Pupils are equal, round, and reactive to light.  Neck:  Vascular: No JVD.  Cardiovascular:     Rate and Rhythm: Normal rate and regular rhythm.     Heart sounds: Normal heart sounds. No murmur heard. No friction rub. No gallop.   Pulmonary:     Effort: Pulmonary effort is normal. No respiratory distress.     Breath sounds: No stridor. Wheezing present. No rhonchi or rales.  Chest:     Chest wall: No tenderness.  Abdominal:     General: Bowel sounds are normal. There is no distension.     Palpations: Abdomen is soft. There is no mass.     Tenderness: There is no abdominal tenderness. There is no guarding or rebound.  Musculoskeletal:        General: No tenderness or deformity. Normal range of motion.     Cervical back: Normal range of motion and neck supple.     Right lower leg: No edema.     Left lower leg: No edema.  Lymphadenopathy:     Cervical: No cervical adenopathy.  Skin:    General: Skin is warm.     Coloration: Skin is not pale.     Findings: No erythema or rash.  Neurological:     Mental Status: He is alert and oriented to person, place, and time.     Cranial Nerves: No cranial nerve deficit.     Motor: No abnormal muscle tone.     Coordination: Coordination normal.     Deep Tendon Reflexes: Reflexes are normal and symmetric.  Psychiatric:        Behavior: Behavior normal.        Thought Content: Thought content normal.        Judgment: Judgment normal.           Assessment & Plan:   Controlled type 2 diabetes mellitus without complication, without long-term current use of insulin (HCC) - Plan: Hemoglobin A1c, CBC with Differential/Platelet, COMPLETE METABOLIC PANEL WITH GFR, Lipid panel, Microalbumin, urine  Benign  essential HTN  Stage 3b chronic kidney disease (HCC)  Pure hypercholesterolemia  Other emphysema ([place])  The patient's blood pressure is acceptable for his age.  I will check an A1c and as long as it is less than 8 I am willing to accept that given his age.  I'm very concerned about his breathing.  Begin a prednisone taper pack and start albuterol every 6 hours until improving.  Begin Trelegy 1 inhalation a day for COPD and recommended smoking cessation.  Monitor kidney function with a CMP.  Check lipid panel and ideally I like his LDL cholesterol to be well below 100.  Recommended Tylenol 500 mg 3 times daily for back pain and what I suspect is spinal stenosis causing neurogenic claudication.  If worsening will need an MRI of the lumbar spine.  Use cortisone cream for neuropathic itching on the back.

## 2020-05-06 LAB — COMPLETE METABOLIC PANEL WITH GFR
AG Ratio: 1.8 (calc) (ref 1.0–2.5)
ALT: 13 U/L (ref 9–46)
AST: 14 U/L (ref 10–35)
Albumin: 4.2 g/dL (ref 3.6–5.1)
Alkaline phosphatase (APISO): 88 U/L (ref 35–144)
BUN/Creatinine Ratio: 15 (calc) (ref 6–22)
BUN: 30 mg/dL — ABNORMAL HIGH (ref 7–25)
CO2: 27 mmol/L (ref 20–32)
Calcium: 9.4 mg/dL (ref 8.6–10.3)
Chloride: 108 mmol/L (ref 98–110)
Creat: 2.04 mg/dL — ABNORMAL HIGH (ref 0.70–1.11)
GFR, Est African American: 34 mL/min/{1.73_m2} — ABNORMAL LOW (ref >=60)
GFR, Est Non African American: 30 mL/min/{1.73_m2} — ABNORMAL LOW (ref >=60)
Globulin: 2.3 g/dL (calc) (ref 1.9–3.7)
Glucose, Bld: 136 mg/dL — ABNORMAL HIGH (ref 65–99)
Potassium: 4.5 mmol/L (ref 3.5–5.3)
Sodium: 144 mmol/L (ref 135–146)
Total Bilirubin: 0.3 mg/dL (ref 0.2–1.2)
Total Protein: 6.5 g/dL (ref 6.1–8.1)

## 2020-05-06 LAB — LIPID PANEL
Cholesterol: 139 mg/dL (ref <200)
HDL: 44 mg/dL (ref >=40)
LDL Cholesterol (Calc): 75 mg/dL (calc)
Non-HDL Cholesterol (Calc): 95 mg/dL (calc) (ref <130)
Total CHOL/HDL Ratio: 3.2 (calc) (ref <5.0)
Triglycerides: 112 mg/dL (ref <150)

## 2020-05-06 LAB — CBC WITH DIFFERENTIAL/PLATELET
Absolute Monocytes: 896 cells/uL (ref 200–950)
Basophils Absolute: 70 cells/uL (ref 0–200)
Basophils Relative: 0.8 %
Eosinophils Absolute: 270 cells/uL (ref 15–500)
Eosinophils Relative: 3.1 %
HCT: 39.6 % (ref 38.5–50.0)
Hemoglobin: 13.2 g/dL (ref 13.2–17.1)
Lymphs Abs: 1349 cells/uL (ref 850–3900)
MCH: 31.4 pg (ref 27.0–33.0)
MCHC: 33.3 g/dL (ref 32.0–36.0)
MCV: 94.3 fL (ref 80.0–100.0)
MPV: 11.3 fL (ref 7.5–12.5)
Monocytes Relative: 10.3 %
Neutro Abs: 6116 cells/uL (ref 1500–7800)
Neutrophils Relative %: 70.3 %
Platelets: 224 10*3/uL (ref 140–400)
RBC: 4.2 10*6/uL (ref 4.20–5.80)
RDW: 13.4 % (ref 11.0–15.0)
Total Lymphocyte: 15.5 %
WBC: 8.7 10*3/uL (ref 3.8–10.8)

## 2020-05-06 LAB — HEMOGLOBIN A1C
Hgb A1c MFr Bld: 6.6 % of total Hgb — ABNORMAL HIGH (ref <5.7)
Mean Plasma Glucose: 143 mg/dL
eAG (mmol/L): 7.9 mmol/L

## 2020-05-06 LAB — MICROALBUMIN, URINE: Microalb, Ur: 2.7 mg/dL

## 2020-06-09 ENCOUNTER — Telehealth: Payer: Self-pay | Admitting: Family Medicine

## 2020-06-09 NOTE — Progress Notes (Signed)
  Chronic Care Management   Outreach Note  06/09/2020 Name: Scott Ward MRN: [id number] DOB: [birth year]  Referred by: Susy Frizzle, MD Reason for referral : No chief complaint on file.   An unsuccessful telephone outreach was attempted today. The patient was referred to the pharmacist for assistance with care management and care coordination.   Follow Up Plan:   Carley Perdue [employer] Scheduler

## 2020-06-23 ENCOUNTER — Telehealth: Payer: Self-pay | Admitting: Family Medicine

## 2020-06-23 NOTE — Progress Notes (Signed)
  Chronic Care Management   Outreach Note  06/23/2020 Name: Scott Ward MRN: [id number] DOB: 09-Oct-1940  Referred by: Susy Frizzle, MD Reason for referral : No chief complaint on file.   A second unsuccessful telephone outreach was attempted today. The patient was referred to pharmacist for assistance with care management and care coordination.  Follow Up Plan:   Carley Perdue [employer] Scheduler

## 2020-06-24 NOTE — Telephone Encounter (Signed)
Son called back and states that we need to call him since his dad can't hear very well over the phone. We should always call the mobile # to discuss appts with Scott Ward.   At this time he is declining the referral to talk with the pharmacist and he will ask Dr. Dennard Ward about it the next time he brings patient in.

## 2020-06-26 ENCOUNTER — Other Ambulatory Visit: Payer: Self-pay | Admitting: *Deleted

## 2020-06-26 DIAGNOSIS — M7989 Other specified soft tissue disorders: Secondary | ICD-10-CM

## 2020-06-26 MED ORDER — POTASSIUM CHLORIDE CRYS ER 20 MEQ PO TBCR: 20.0000 meq | EXTENDED_RELEASE_TABLET | Freq: Every day | ORAL | 1 refills | Status: DC

## 2020-06-26 MED ORDER — METFORMIN HCL 500 MG PO TABS: ORAL_TABLET | ORAL | 1 refills | Status: DC

## 2020-06-26 MED ORDER — SIMVASTATIN 20 MG PO TABS: 20.0000 mg | ORAL_TABLET | Freq: Every day | ORAL | 1 refills | Status: DC

## 2020-07-09 ENCOUNTER — Telehealth: Payer: Self-pay | Admitting: Family Medicine

## 2020-07-09 NOTE — Progress Notes (Signed)
*  Please call patients Vivianne Master [phone] for the appointment*    Chronic Care Management   Note  07/09/2020 Name: Scott Ward MRN: [id number] DOB: [birth year]  Scott Ward is a 80 y.o. year old male who is a primary care patient of [name], [name], MD. I reached out to Scott Ward by phone today in response to a referral sent by Scott Ward's PCP, Susy Frizzle, MD.   [name] was given information about Chronic Care Management services today including:  1. CCM service includes personalized support from designated clinical staff supervised by his physician, including individualized plan of care and coordination with other care providers 2. 24/7 contact phone numbers for assistance for urgent and routine care needs. 3. Service will only be billed when office clinical staff spend 20 minutes or more in a month to coordinate care. 4. Only one practitioner may furnish and bill the service in a calendar month. 5. The patient may stop CCM services at any time (effective at the end of the month) by phone call to the office staff.   Patient agreed to services and verbal consent obtained.   Follow up plan:   Carley Perdue [employer] Scheduler

## 2020-07-27 IMAGING — US US RENAL
1 series · 14 of 25 positions shown · non-contrast
Comparison: Ultrasound abdomen 04/30/2011

CLINICAL DATA: Renal insufficiency, diabetes mellitus,
hypertension, smoker

EXAM:
RENAL / URINARY TRACT ULTRASOUND COMPLETE

[Series 1: us renal · 0.22mm/px · 14 of 32 slices shown]
[im 1/32]
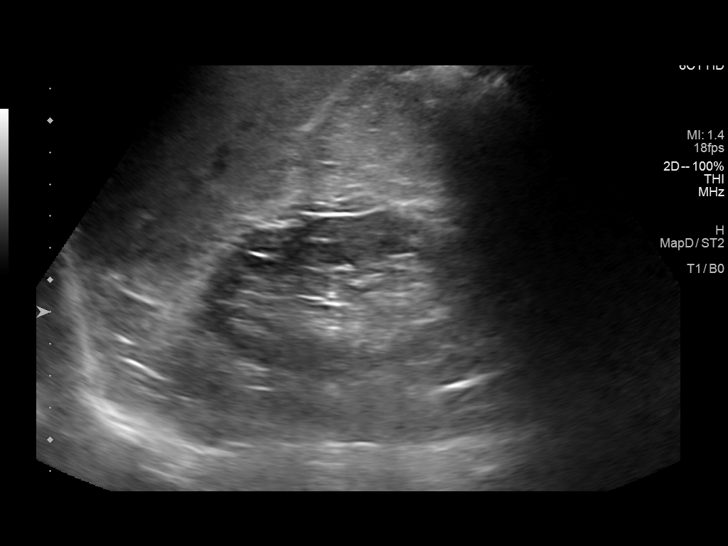
[im 3/32]
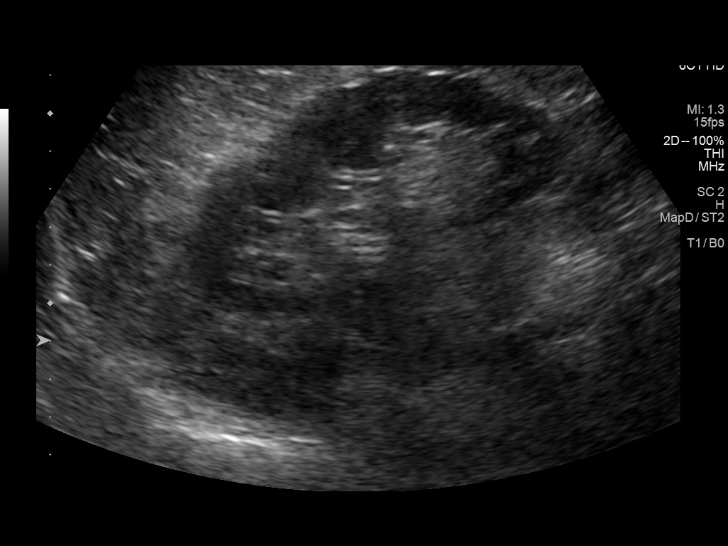
[im 6/32]
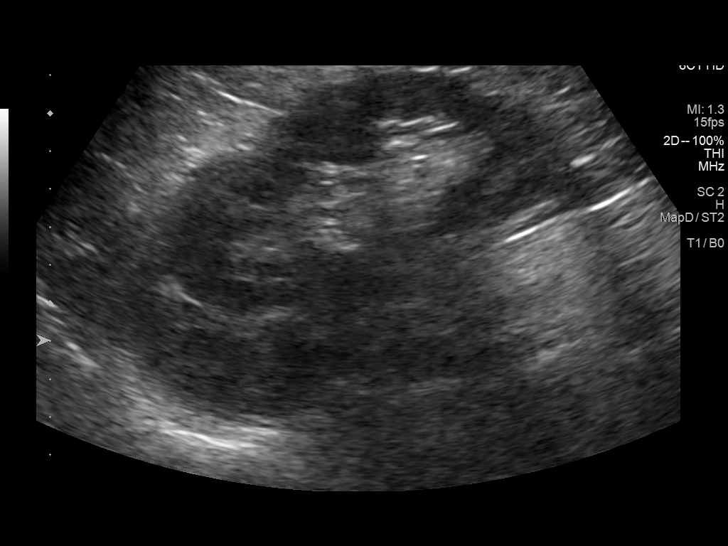
[im 8/32]
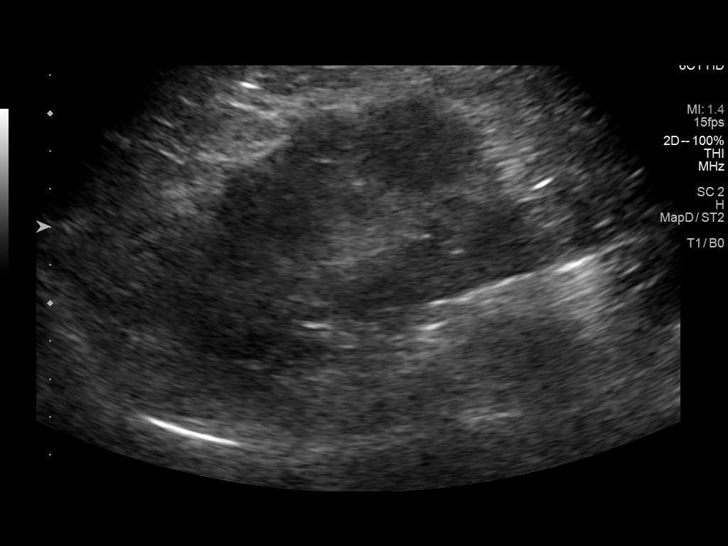
[im 11/32]
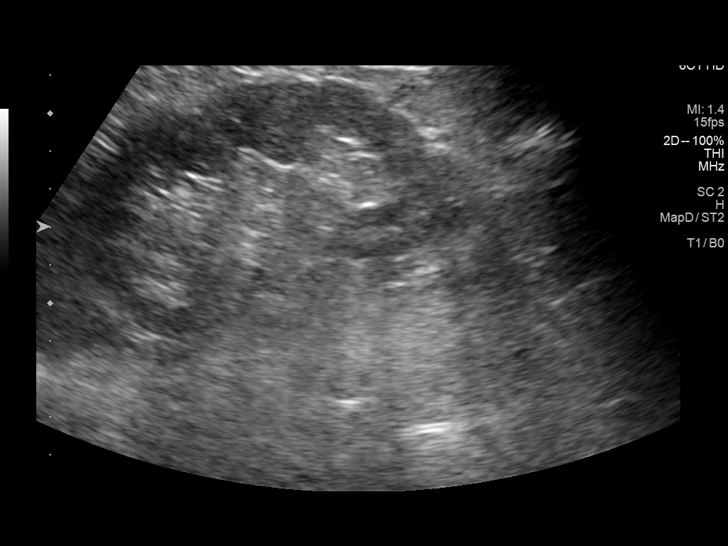
[im 12/32]
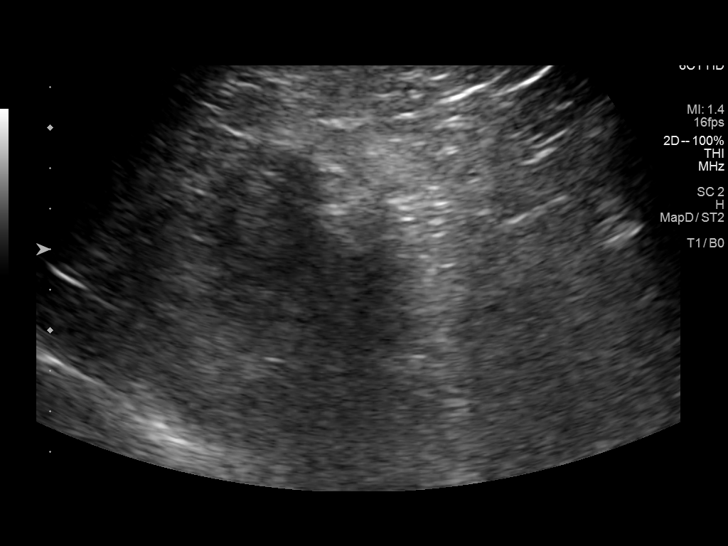
[im 15/32]
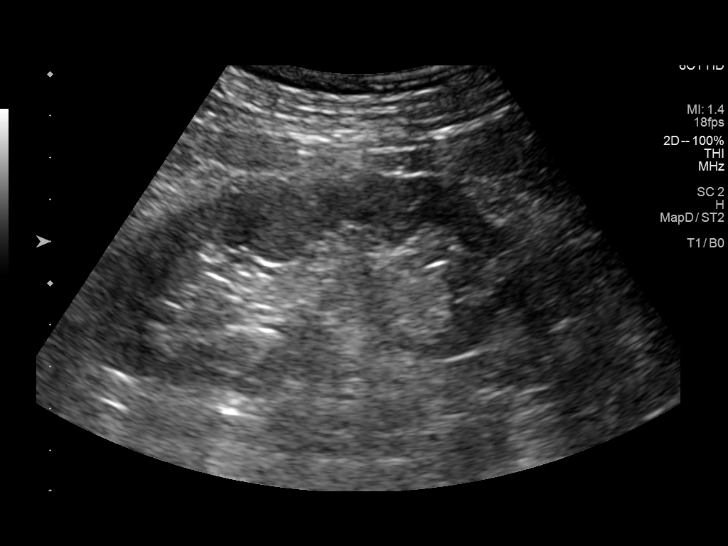
[im 17/32]
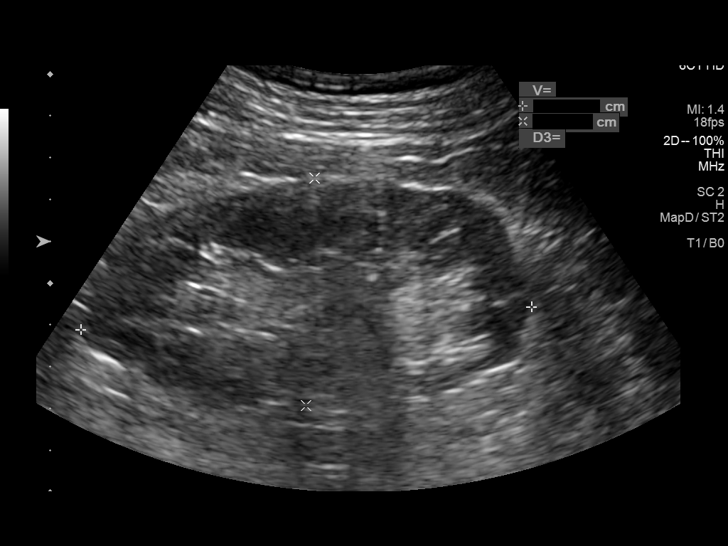
[im 20/32]
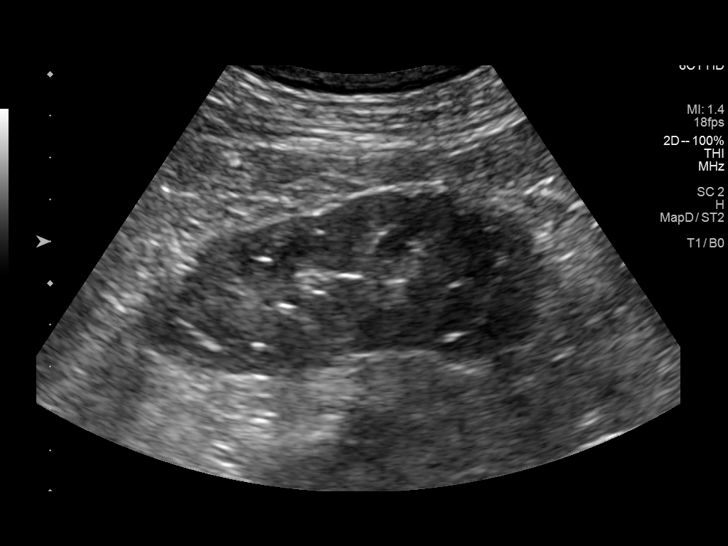
[im 21/32]
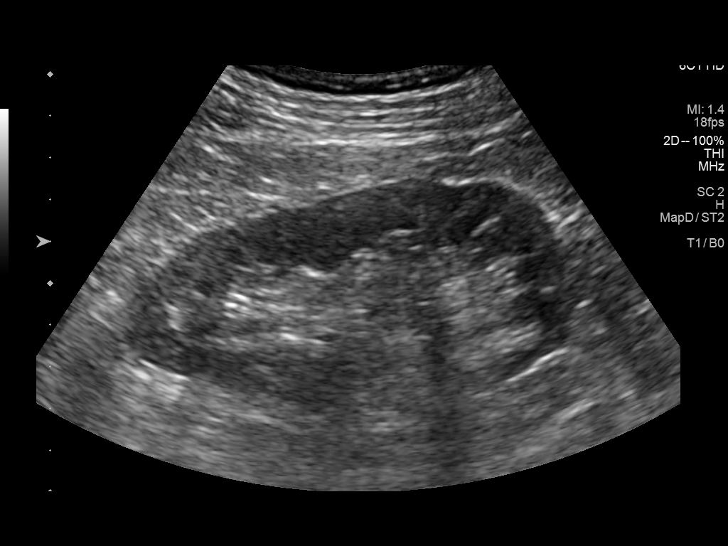
[im 24/32]
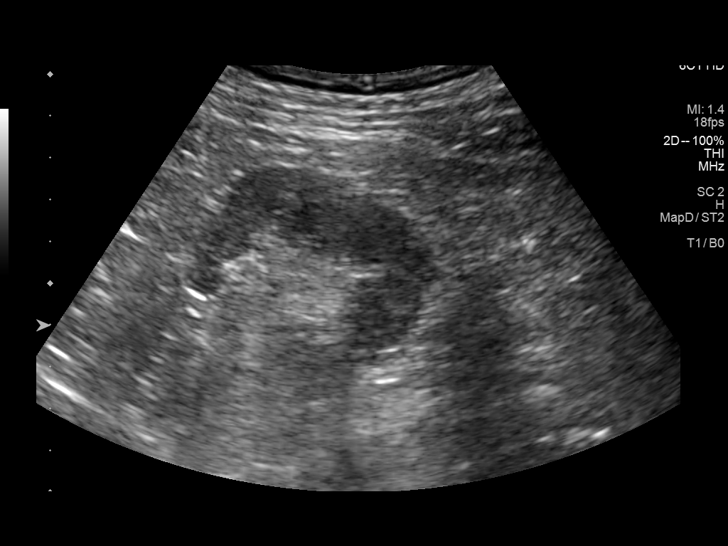
[im 26/32]
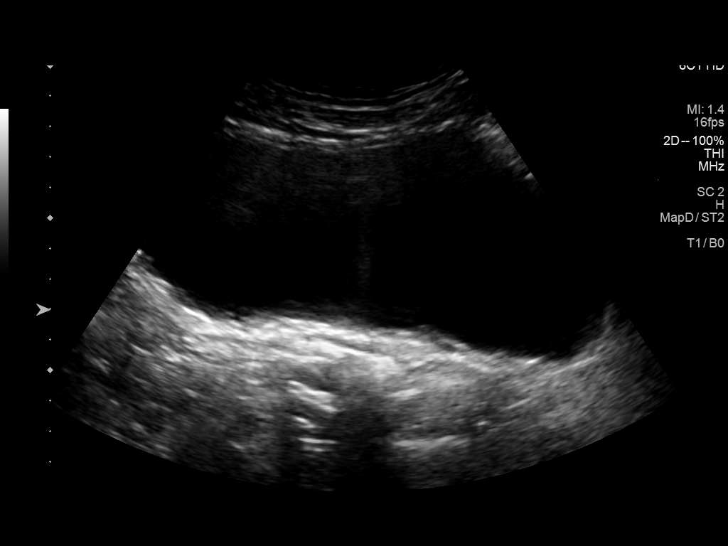
[im 29/32]
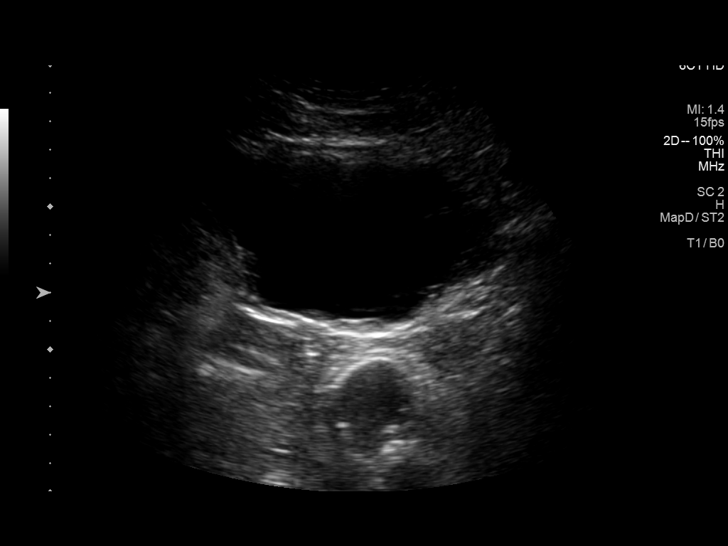
[im 32/32]
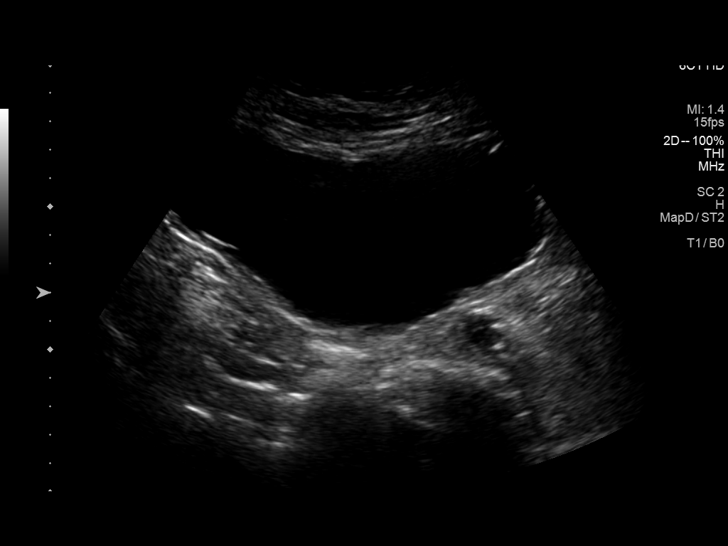

[14 of 25 positions shown; findings below may reference images not displayed]

FINDINGS: Right Kidney:

Renal measurements: 10.5 x 5.0 x 5.4 cm = volume: 149 mL. Cortical
thinning. Upper normal cortical echogenicity. No mass,
hydronephrosis or shadowing calcification.

Left Kidney:

Renal measurements: 10.8 x 5.4 x 4.7 cm = volume: 143 mL. Cortical
thinning. Upper normal cortical echogenicity. No mass,
hydronephrosis or shadowing calcification.

Bladder:

Appears normal for degree of bladder distention.
IMPRESSION: BILATERAL renal cortical atrophy without mass or hydronephrosis.

## 2020-08-20 ENCOUNTER — Telehealth: Payer: Self-pay | Admitting: Pharmacist

## 2020-08-20 NOTE — Progress Notes (Addendum)
    Chronic Care Management Pharmacy Assistant   Name: Scott Ward  MRN: [id number] DOB: [birth year]  [name] is an 80 y.o. year old male who presents for his initial CCM visit with the clinical pharmacist.  Reason for Encounter: Chart Prep    Conditions to be addressed/monitored: HTN, COPD, DM.  Primary concerns for visit include: HTN,COPD, DM.   Recent office visits:  05/05/20 Dr. Dennard Schaumann Follow-up. STARTED Fluticasone-Umeclindin-Vilant 100-62.5-25 MCG/INH 1 Inhaler Inhalation Daily and Prednisone 20 mg pack. Per note:Recommended Tylenol 500 mg 3 times daily for back pain and what I suspect is spinal stenosis causing neurogenic claudication  Recent consult visits: None in the last six months  Hospital visits:  None in previous 6 months  Medication History: Lisinopril 40 mg 90 DS 07/10/20 Simvastatin 20 mg 90 DS 06/26/20 Metformin 500 mg 90 DS 06/26/20   Medications: Outpatient Encounter Medications as of 08/20/2020  Medication Sig   albuterol (VENTOLIN HFA) 108 (90 Base) MCG/ACT inhaler Inhale 2 puffs into the lungs every 6 (six) hours as needed for wheezing or shortness of breath.   aspirin 81 MG tablet Take 81 mg by mouth daily.     finasteride (PROSCAR) 5 MG tablet Take 1 tablet (5 mg total) by mouth daily.   Fluticasone-Umeclidin-Vilant (TRELEGY ELLIPTA) 100-62.5-25 MCG/INH AEPB Inhale 1 Inhaler into the lungs daily.   furosemide (LASIX) 40 MG tablet Take 1 tablet (40 mg total) by mouth daily as needed for edema.   lisinopril (ZESTRIL) 40 MG tablet Take 1 tablet (40 mg total) by mouth daily.   metFORMIN (GLUCOPHAGE) 500 MG tablet TAKE TWO TABLETS BY MOUTH TWICE A DAY WITH MEALS   potassium chloride SA (KLOR-CON M20) 20 MEQ tablet Take 1 tablet (20 mEq total) by mouth daily.   simvastatin (ZOCOR) 20 MG tablet Take 1 tablet (20 mg total) by mouth at bedtime.   No facility-administered encounter medications on file as of 08/20/2020.    Have you seen any other providers  since your last visit? Patients son stated no.  Any changes in your medications or health? Patients son stated no.  Any side effects from any medications? Patients son stated no.  Do you have an symptoms or problems not managed by your medications? Patients son stated no.  Any concerns about your health right now? Patients son stated no.  Has your provider asked that you check blood pressure, blood sugar, or follow special diet at home? Patients son stated he has his son on a CBG diet and monitors his fathers blood pressure.  Do you get any type of exercise on a regular basis? Patients son stated no.  Can you think of a goal you would like to reach for your health? Patients son stated no.  Do you have any problems getting your medications? Patients son no stated no.  Is there anything that you would like to discuss during the appointment? Patients son stated no.  Please bring medications and supplements to appointment, had to change his fathers appointment to 09/23/20 at 9 am. Patients son reminded of his OTP appointment for his father/the patient.   Follow-Up:Pharmacist Review  Charlann Lange, [place] Pharmacist Assistant [phone]

## 2020-08-25 ENCOUNTER — Telehealth: Payer: Medicare HMO

## 2020-08-27 ENCOUNTER — Other Ambulatory Visit: Payer: Self-pay | Admitting: *Deleted

## 2020-08-27 MED ORDER — METFORMIN HCL 500 MG PO TABS: 500.0000 mg | ORAL_TABLET | Freq: Two times a day (BID) | ORAL | 1 refills | Status: DC

## 2020-08-28 ENCOUNTER — Other Ambulatory Visit: Payer: Self-pay | Admitting: *Deleted

## 2020-08-28 MED ORDER — FUROSEMIDE 40 MG PO TABS: 40.0000 mg | ORAL_TABLET | Freq: Every day | ORAL | 1 refills | Status: DC | PRN

## 2020-10-10 ENCOUNTER — Other Ambulatory Visit: Payer: Self-pay | Admitting: *Deleted

## 2020-10-10 MED ORDER — LISINOPRIL 40 MG PO TABS: 40.0000 mg | ORAL_TABLET | Freq: Every day | ORAL | 3 refills | Status: DC

## 2020-11-06 ENCOUNTER — Encounter: Payer: Self-pay | Admitting: Family Medicine

## 2020-11-06 ENCOUNTER — Ambulatory Visit (INDEPENDENT_AMBULATORY_CARE_PROVIDER_SITE_OTHER): Payer: Medicare HMO | Admitting: Family Medicine

## 2020-11-06 ENCOUNTER — Other Ambulatory Visit: Payer: Self-pay

## 2020-11-06 VITALS — BP 148/86 | HR 90 | Temp 98.5°F | Resp 16 | Ht 71.0 in | Wt 208.0 lb

## 2020-11-06 DIAGNOSIS — E7800 Pure hypercholesterolemia, unspecified: Secondary | ICD-10-CM | POA: Diagnosis not present

## 2020-11-06 DIAGNOSIS — E119 Type 2 diabetes mellitus without complications: Secondary | ICD-10-CM | POA: Diagnosis not present

## 2020-11-06 DIAGNOSIS — J438 Other emphysema: Secondary | ICD-10-CM

## 2020-11-06 DIAGNOSIS — I10 Essential (primary) hypertension: Secondary | ICD-10-CM | POA: Diagnosis not present

## 2020-11-06 DIAGNOSIS — N1832 Chronic kidney disease, stage 3b: Secondary | ICD-10-CM | POA: Diagnosis not present

## 2020-11-06 NOTE — Progress Notes (Signed)
Subjective:    Patient ID: Scott Ward, male    DOB: 1940-06-14, 80 y.o.   MRN: [id number] 04/2020 Patient is here today with his son for a checkup.  However I became very concerned after simply checking his vital signs.  Oxygen saturation is 89 to 90% on room air.  He has a markedly diminished breath sounds all throughout and diffuse expiratory wheezing.  He is only used his albuterol once or twice in the last week.  He states that he does not feel short of breath however his son admits that he is very sedentary and easily gets winded.  He denies any chest pain.  He denies any orthopnea.  There is no peripheral edema on his exam.  His legs are nonswollen and I do not appreciate any crackles in his lungs.  However his wheezing is more than his baseline and he seems to be developing a COPD exacerbation.  He is not on any maintenance medication and he smokes about 1 pack of cigarettes a day.  Since I last saw the patient, his son has had to increase his Lasix to 40 mg every day to help control the swelling.  His son also feels that his A1c will be elevated as his blood sugars have been consistently higher at home.  Blood pressure today is borderline however he denies any chest pain.  He is complaining of burning stinging pain in both feet.  He also complains of symptoms of neurogenic claudication.  He reports aching pain in his thighs and in his calves when he is walking that improves when he is resting.  I performed vascular ultrasound in 2020 that showed no significant signs of arterial occlusion.  He had normal ABIs.  Therefore I feel that most of this is neurogenic claudication.  He also reports itching sensation all over his back although he has no visible rash which I believe is likely also neuropathic in nature.  At that time, my plan was:  The patient's blood pressure is acceptable for his age.  I will check an A1c and as long as it is less than 8 I am willing to accept that given his age.  I'm very  concerned about his breathing.  Begin a prednisone taper pack and start albuterol every 6 hours until improving.  Begin Trelegy 1 inhalation a day for COPD and recommended smoking cessation.  Monitor kidney function with a CMP.  Check lipid panel and ideally I like his LDL cholesterol to be well below 100.  Recommended Tylenol 500 mg 3 times daily for back pain and what I suspect is spinal stenosis causing neurogenic claudication.  If worsening will need an MRI of the lumbar spine.  Use cortisone cream for neuropathic itching on the back.  11/06/20 Still dealing with pain in his legs whenever he tries to walk.  The pain seems to be from his hips all the way down to his ankles.  It almost sounds like claudication.  It improves with rest.  He denies any numbness or tingling in his legs.  He does complain of weakness in his legs.  He denies any pain or weakness in his arms.  Vascular ultrasound was negative for any significant peripheral vascular disease and therefore I feel that he is likely dealing with neurogenic claudication.  At the present time he declines wanting an MRI or lumbar spine x-ray to evaluate further.  His blood pressures at home have been around [id number] systolic over 80 diastolic.  He is using the Trelegy on a daily basis and his breathing has improved.  Today on his exam his lungs sound much better.  He is not requiring the albuterol often.  His son is monitoring his pulse oximetry and it is consistently staying 92-94 on the Trelegy.  His blood sugars are typically 120 or less in the morning. Immunization History  Administered Date(s) Administered   Fluad Quad(high Dose 65+) 10/26/2018, 11/08/2019   Influenza Whole 12/22/2007, 12/25/2008, 01/07/2010   Influenza, High Dose Seasonal PF 01/04/2017   Influenza,inj,Quad PF,6+ Mos 01/18/2013, 05/17/2014, 11/22/2014, 03/03/2016, 11/15/2017   Pneumococcal Conjugate-13 01/18/2013   Pneumococcal Polysaccharide-23 03/10/2004, 05/20/2014   Td 03/10/2004     Past Medical History:  Diagnosis Date   Arthritis    "lower back" (03/02/2016)   CAP (community acquired pneumonia) 2018   Right Middle Lobe   Colon polyp    COPD (chronic obstructive pulmonary disease) ([place]) dx'd 03/02/2016   Dermatochalasis    Diabetes mellitus without complication ([place])    Fatty liver    High blood pressure    High cholesterol    History of BPH    History of colon polyps    HOH (hard of hearing)    Nicotine abuse    Right carotid bruit    Past Surgical History:  Procedure Laterality Date   BLEPHAROPLASTY Bilateral    CATARACT EXTRACTION W/ INTRAOCULAR LENS  IMPLANT, BILATERAL     COLONOSCOPY     TRANSURETHRAL RESECTION OF PROSTATE N/A 02/06/2019   Procedure: TRANSURETHRAL RESECTION OF THE PROSTATE (TURP);  Surgeon: Irine Seal, MD;  Location: [facility];  Service: Urology;  Laterality: N/A;   Current Outpatient Medications on File Prior to Visit  Medication Sig Dispense Refill   albuterol (VENTOLIN HFA) 108 (90 Base) MCG/ACT inhaler Inhale 2 puffs into the lungs every 6 (six) hours as needed for wheezing or shortness of breath. 18 g 1   aspirin 81 MG tablet Take 81 mg by mouth daily.       finasteride (PROSCAR) 5 MG tablet Take 1 tablet (5 mg total) by mouth daily. 90 tablet 3   Fluticasone-Umeclidin-Vilant (TRELEGY ELLIPTA) 100-62.5-25 MCG/INH AEPB Inhale 1 Inhaler into the lungs daily. 1 each 11   furosemide (LASIX) 40 MG tablet Take 1 tablet (40 mg total) by mouth daily as needed for edema. 90 tablet 1   lisinopril (ZESTRIL) 40 MG tablet Take 1 tablet (40 mg total) by mouth daily. 90 tablet 3   metFORMIN (GLUCOPHAGE) 500 MG tablet Take 1 tablet (500 mg total) by mouth 2 (two) times daily with a meal. 180 tablet 1   potassium chloride SA (KLOR-CON M20) 20 MEQ tablet Take 1 tablet (20 mEq total) by mouth daily. 90 tablet 1   simvastatin (ZOCOR) 20 MG tablet Take 1 tablet (20 mg total) by mouth at bedtime. 90 tablet 1   No current facility-administered  medications on file prior to visit.   Allergies  Allergen Reactions   Metformin And Related Other (See Comments)    Per pt gives him too much gas and refuses to take.   Social History   Socioeconomic History   Marital status: Divorced    Spouse name: Not on file   Number of children: 1   Years of education: 8   Highest education level: Not on file  Occupational History   Occupation: RETIRED  Tobacco Use   Smoking status: Every Day    Packs/day: 1.00    Years: 60.00  Pack years: 60.00    Types: Cigarettes   Smokeless tobacco: Never  Vaping Use   Vaping Use: Never used  Substance and Sexual Activity   Alcohol use: Not Currently    Comment: 03/02/2016 "nothing since 1973"   Drug use: No   Sexual activity: Never  Other Topics Concern   Not on file  Social History Narrative   Patient is single with one child.   Patient is left handed.   Patient has an 8 th grade education.   Patient drinks up to 3 cups daily.   Social Determinants of Health   Financial Resource Strain: Not on file  Food Insecurity: Not on file  Transportation Needs: Not on file  Physical Activity: Not on file  Stress: Not on file  Social Connections: Not on file  Intimate Partner Violence: Not on file   Family History  Problem Relation Age of Onset   Heart attack Father    Heart attack Sister    Lung cancer Sister       Review of Systems  All other systems reviewed and are negative.     Objective:   Physical Exam Vitals reviewed.  Constitutional:      General: He is not in acute distress.    Appearance: He is well-developed. He is not diaphoretic.  HENT:     Head: Normocephalic and atraumatic.     Right Ear: External ear normal.     Left Ear: External ear normal.     Nose: Nose normal.     Mouth/Throat:     Pharynx: No oropharyngeal exudate.  Eyes:     General: No scleral icterus.       Right eye: No discharge.        Left eye: No discharge.     Conjunctiva/sclera:  Conjunctivae normal.     Pupils: Pupils are equal, round, and reactive to light.  Neck:     Vascular: No JVD.  Cardiovascular:     Rate and Rhythm: Normal rate and regular rhythm.     Heart sounds: Normal heart sounds. No murmur heard.   No friction rub. No gallop.  Pulmonary:     Effort: Pulmonary effort is normal. No respiratory distress.     Breath sounds: No stridor. No wheezing, rhonchi or rales.  Chest:     Chest wall: No tenderness.  Abdominal:     General: Bowel sounds are normal. There is no distension.     Palpations: Abdomen is soft. There is no mass.     Tenderness: There is no abdominal tenderness. There is no guarding or rebound.  Musculoskeletal:        General: No tenderness or deformity. Normal range of motion.     Cervical back: Normal range of motion and neck supple.     Right lower leg: No edema.     Left lower leg: No edema.  Lymphadenopathy:     Cervical: No cervical adenopathy.  Skin:    General: Skin is warm.     Coloration: Skin is not pale.     Findings: No erythema or rash.  Neurological:     Mental Status: He is alert and oriented to person, place, and time.     Cranial Nerves: No cranial nerve deficit.     Motor: No abnormal muscle tone.     Coordination: Coordination normal.     Deep Tendon Reflexes: Reflexes are normal and symmetric.  Psychiatric:        Behavior:  Behavior normal.        Thought Content: Thought content normal.        Judgment: Judgment normal.          Assessment & Plan:   Controlled type 2 diabetes mellitus without complication, without long-term current use of insulin (HCC) - Plan: Hemoglobin A1c, CBC with Differential/Platelet, COMPLETE METABOLIC PANEL WITH GFR, Lipid panel  Benign essential HTN  Stage 3b chronic kidney disease ([place])  Other emphysema ([place])  Pure hypercholesterolemia I recommended the patient get the high-dose flu shot when available as well as a COVID booster.  Blood pressure here is elevated  but blood pressure at home is excellent.  Check CBC CMP lipid panel and A1c.  Goal A1c is less than 7.  Monitor the patient stage IIIb chronic kidney disease.  Check fasting lipid panel.  Try to keep LDL cholesterol well below's 100.  Offered an MRI to evaluate for neurogenic claudication but patient politely declines today.  The remainder of his preventative care is up-to-date.  Continued to encourage him to use Trelegy on a daily basis and also discussed strategies to prevent thrush.

## 2020-11-07 ENCOUNTER — Encounter: Payer: Self-pay | Admitting: *Deleted

## 2020-11-07 LAB — CBC WITH DIFFERENTIAL/PLATELET
Absolute Monocytes: 872 cells/uL (ref 200–950)
Basophils Absolute: 62 cells/uL (ref 0–200)
Basophils Relative: 0.7 %
Eosinophils Absolute: 214 cells/uL (ref 15–500)
Eosinophils Relative: 2.4 %
HCT: 41.5 % (ref 38.5–50.0)
Hemoglobin: 13.8 g/dL (ref 13.2–17.1)
Lymphs Abs: 1237 cells/uL (ref 850–3900)
MCH: 32.4 pg (ref 27.0–33.0)
MCHC: 33.3 g/dL (ref 32.0–36.0)
MCV: 97.4 fL (ref 80.0–100.0)
MPV: 11.4 fL (ref 7.5–12.5)
Monocytes Relative: 9.8 %
Neutro Abs: 6515 cells/uL (ref 1500–7800)
Neutrophils Relative %: 73.2 %
Platelets: 246 10*3/uL (ref 140–400)
RBC: 4.26 10*6/uL (ref 4.20–5.80)
RDW: 14.1 % (ref 11.0–15.0)
Total Lymphocyte: 13.9 %
WBC: 8.9 10*3/uL (ref 3.8–10.8)

## 2020-11-07 LAB — COMPLETE METABOLIC PANEL WITH GFR
AG Ratio: 1.9 (calc) (ref 1.0–2.5)
ALT: 9 U/L (ref 9–46)
AST: 12 U/L (ref 10–35)
Albumin: 4.4 g/dL (ref 3.6–5.1)
Alkaline phosphatase (APISO): 78 U/L (ref 35–144)
BUN/Creatinine Ratio: 15 (calc) (ref 6–22)
BUN: 29 mg/dL — ABNORMAL HIGH (ref 7–25)
CO2: 26 mmol/L (ref 20–32)
Calcium: 10 mg/dL (ref 8.6–10.3)
Chloride: 107 mmol/L (ref 98–110)
Creat: 1.95 mg/dL — ABNORMAL HIGH (ref 0.70–1.22)
Globulin: 2.3 g/dL (calc) (ref 1.9–3.7)
Glucose, Bld: 124 mg/dL — ABNORMAL HIGH (ref 65–99)
Potassium: 5 mmol/L (ref 3.5–5.3)
Sodium: 142 mmol/L (ref 135–146)
Total Bilirubin: 0.3 mg/dL (ref 0.2–1.2)
Total Protein: 6.7 g/dL (ref 6.1–8.1)
eGFR: 34 mL/min/{1.73_m2} — ABNORMAL LOW (ref >=60)

## 2020-11-07 LAB — LIPID PANEL
Cholesterol: 150 mg/dL (ref <200)
HDL: 41 mg/dL (ref >=40)
LDL Cholesterol (Calc): 83 mg/dL (calc)
Non-HDL Cholesterol (Calc): 109 mg/dL (calc) (ref <130)
Total CHOL/HDL Ratio: 3.7 (calc) (ref <5.0)
Triglycerides: 165 mg/dL — ABNORMAL HIGH (ref <150)

## 2020-11-07 LAB — HEMOGLOBIN A1C
Hgb A1c MFr Bld: 6.5 % of total Hgb — ABNORMAL HIGH (ref <5.7)
Mean Plasma Glucose: 140 mg/dL
eAG (mmol/L): 7.7 mmol/L

## 2020-11-26 ENCOUNTER — Telehealth: Payer: Self-pay | Admitting: Family Medicine

## 2020-11-26 NOTE — Telephone Encounter (Signed)
Left message for patient to call back and schedule Medicare Annual Wellness Visit (AWV) in office.   If not able to come in office, please offer to do virtually or by telephone.  Left office number and my jabber [phone].  Last AWV:11/08/2019  Please schedule at anytime with Nurse Health Advisor.

## 2020-12-14 ENCOUNTER — Encounter: Payer: Self-pay | Admitting: Family Medicine

## 2020-12-15 ENCOUNTER — Other Ambulatory Visit: Payer: Self-pay

## 2020-12-15 ENCOUNTER — Ambulatory Visit (INDEPENDENT_AMBULATORY_CARE_PROVIDER_SITE_OTHER): Payer: Medicare HMO | Admitting: *Deleted

## 2020-12-15 DIAGNOSIS — Z23 Encounter for immunization: Secondary | ICD-10-CM | POA: Diagnosis not present

## 2020-12-16 ENCOUNTER — Other Ambulatory Visit: Payer: Self-pay | Admitting: Family Medicine

## 2020-12-16 DIAGNOSIS — G9519 Other vascular myelopathies: Secondary | ICD-10-CM

## 2020-12-16 DIAGNOSIS — R29898 Other symptoms and signs involving the musculoskeletal system: Secondary | ICD-10-CM

## 2020-12-23 ENCOUNTER — Other Ambulatory Visit: Payer: Self-pay

## 2020-12-23 DIAGNOSIS — M7989 Other specified soft tissue disorders: Secondary | ICD-10-CM

## 2020-12-23 MED ORDER — POTASSIUM CHLORIDE CRYS ER 20 MEQ PO TBCR
20.0000 meq | EXTENDED_RELEASE_TABLET | Freq: Every day | ORAL | 1 refills | Status: DC
Start: 2020-12-23 — End: 2021-06-25

## 2020-12-23 MED ORDER — SIMVASTATIN 20 MG PO TABS: 20.0000 mg | ORAL_TABLET | Freq: Every day | ORAL | 1 refills | Status: DC

## 2021-01-06 ENCOUNTER — Ambulatory Visit
Admission: RE | Admit: 2021-01-06 | Discharge: 2021-01-06 | Disposition: A | Discharge status: Home / Self Care | Payer: Medicare HMO | Source: Ambulatory Visit | Attending: Family Medicine | Admitting: Family Medicine

## 2021-01-06 ENCOUNTER — Other Ambulatory Visit: Payer: Self-pay

## 2021-01-06 DIAGNOSIS — R29898 Other symptoms and signs involving the musculoskeletal system: Secondary | ICD-10-CM

## 2021-01-06 DIAGNOSIS — M48061 Spinal stenosis, lumbar region without neurogenic claudication: Secondary | ICD-10-CM | POA: Diagnosis not present

## 2021-01-06 DIAGNOSIS — G9519 Other vascular myelopathies: Secondary | ICD-10-CM

## 2021-01-06 DIAGNOSIS — M545 Low back pain, unspecified: Secondary | ICD-10-CM | POA: Diagnosis not present

## 2021-01-12 ENCOUNTER — Other Ambulatory Visit: Payer: Self-pay | Admitting: *Deleted

## 2021-01-12 DIAGNOSIS — G9519 Other vascular myelopathies: Secondary | ICD-10-CM

## 2021-01-12 DIAGNOSIS — M7989 Other specified soft tissue disorders: Secondary | ICD-10-CM

## 2021-01-12 DIAGNOSIS — R29898 Other symptoms and signs involving the musculoskeletal system: Secondary | ICD-10-CM

## 2021-01-20 ENCOUNTER — Telehealth: Payer: Self-pay | Admitting: Physical Medicine and Rehabilitation

## 2021-01-20 NOTE — Telephone Encounter (Signed)
Patient's son Scott Ward called needing to schedule an appointment for his father to get an injection in his back. The number to contact Scott Ward is [phone]

## 2021-02-04 ENCOUNTER — Encounter: Payer: Self-pay | Admitting: Physical Medicine and Rehabilitation

## 2021-02-04 ENCOUNTER — Other Ambulatory Visit: Payer: Self-pay

## 2021-02-04 ENCOUNTER — Ambulatory Visit: Payer: Medicare HMO | Admitting: Physical Medicine and Rehabilitation

## 2021-02-04 ENCOUNTER — Telehealth: Payer: Self-pay | Admitting: Physical Medicine and Rehabilitation

## 2021-02-04 VITALS — BP 168/93 | HR 91

## 2021-02-04 DIAGNOSIS — R600 Localized edema: Secondary | ICD-10-CM | POA: Diagnosis not present

## 2021-02-04 DIAGNOSIS — R296 Repeated falls: Secondary | ICD-10-CM | POA: Diagnosis not present

## 2021-02-04 DIAGNOSIS — M4726 Other spondylosis with radiculopathy, lumbar region: Secondary | ICD-10-CM | POA: Diagnosis not present

## 2021-02-04 DIAGNOSIS — M47816 Spondylosis without myelopathy or radiculopathy, lumbar region: Secondary | ICD-10-CM

## 2021-02-04 DIAGNOSIS — M5416 Radiculopathy, lumbar region: Secondary | ICD-10-CM | POA: Diagnosis not present

## 2021-02-04 NOTE — Telephone Encounter (Signed)
Pt's son [name] called requesting to reschedule his appt. Please call [name] at [phone]

## 2021-02-04 NOTE — Progress Notes (Signed)
Pt state lower back pain that travels down both legs. Pt state walking and standing makes the pain worse. Pt state he takes over the counter pain meds to help ease his pain.  Numeric Pain Rating Scale and Functional Assessment Average Pain 10 Pain Right Now 8 My pain is constant, sharp, burning, and aching Pain is worse with: walking, standing, and some activites Pain improves with: rest and medication   In the last MONTH (on 0-10 scale) has pain interfered with the following?  1. General activity like being  able to carry out your everyday physical activities such as walking, climbing stairs, carrying groceries, or moving a chair?  Rating(7)  2. Relation with others like being able to carry out your usual social activities and roles such as  activities at home, at work and in your community. Rating(8)  3. Enjoyment of life such that you have  been bothered by emotional problems such as feeling anxious, depressed or irritable?  Rating(9)

## 2021-02-04 NOTE — Progress Notes (Signed)
Scott Ward - 80 y.o. male MRN [id number]  Date of birth: 01-Jul-1940  Office Visit Note: Visit Date: 02/04/2021 PCP: Susy Frizzle, MD Referred by: Susy Frizzle, MD  Subjective: Chief Complaint  Patient presents with   Lower Back - Pain   Left Leg - Pain   Right Leg - Pain   HPI: Scott Ward is a 80 y.o. male who comes in today per the request of Dr. Jenna Luo for evaluation of bilateral lower back pain radiating to both legs and feet. Patient's son accompanying patient during out visit today. Patient reports pain has been ongoing for 1 year. Patient states pain is exacerbated by walking and standing, describes as soreness sensation, currently rates as 8 out of 10. Patient reports some relief of pain with sitting, laying down and Tylenol. Patient's recent lumbar MRI exhibits multi-level facet arthropathy and right far lateral disc osteophyte at L4-L5 that is encroaching the exiting L5 nerve root. There is also mild spinal canal stenosis noted at L4-L5. Son reports recent decline in patient's health and functional ability, he reports multiple falls and states patient is now using cane to assist with balance and ambulation. Patient reports he used to walk to mailbox several months ago, but reports he is now only able to walk short distances due to severe pain and discomfort in his legs. Patient states he is now more sedentary and reports increased swelling to bilateral lower extremities. Patient did have bilateral lower extremity doppler study in 2020, no evidence of bilateral lower extremity arterial disease. Patient denies focal weakness. Patient denies bowel/bladder incontinence.  Patient's course is complicated by tobacco abuse, COPD and diabetes mellitus.   Review of Systems  Musculoskeletal:  Positive for back pain.  Neurological:  Positive for tingling. Negative for focal weakness and weakness.  All other systems reviewed and are negative. Otherwise per HPI.  Assessment  & Plan: Visit Diagnoses:    ICD-10-CM   1. Other spondylosis with radiculopathy, lumbar region  M47.26 Ambulatory referral to Physical Medicine Rehab    2. Facet hypertrophy of lumbar region  M47.816     3. Lumbar radiculopathy  M54.16 Ambulatory referral to Physical Medicine Rehab    4. Frequent falls  R29.6     5. Bilateral lower extremity edema  R60.0        Plan: Findings:  Chronic, worsening and severe bilateral lower back pain radiating to both legs and feet.  Patient continues to have excruciating and debilitating pain despite good conservative therapies such as rest and use of medications.  Patient's clinical presentation and exam are consistent with L5 nerve pattern as well as neurogenic claudication as a result of spinal canal stenosis.  Patient's recent MRI report does note mild spinal canal stenosis at the level L4-5, however after reviewing the images we feel that this is more of a moderate spinal canal stenosis.  Patient's pain is causing him to have issues with functionality, making it difficult for him to walk and has caused him to fall several times at home. Patient does have bilateral lower extremity swelling upon exam today. We believe the next step is to perform a diagnostic and hopefully therapeutic bilateral L4 transforaminal epidural steroid injection under fluoroscopic guidance.  We spoke with patient and son today in detail regarding epidural steroid injection procedure.  We will follow-up with patient after injection to reassess his pain and functionality.  We did instruct patient to continue using cane to assist with ambulation and prevent  falls.  No red flag symptoms noted upon exam today.   Meds & Orders: No orders of the defined types were placed in this encounter.   Orders Placed This Encounter  Procedures   Ambulatory referral to Physical Medicine Rehab    Follow-up: Return for Bilateral L4 transforaminal epidural steroid injection.   Procedures: No  procedures performed      Clinical History: MRI LUMBAR SPINE WITHOUT CONTRAST   TECHNIQUE: Multiplanar, multisequence MR imaging of the lumbar spine was performed. No intravenous contrast was administered.   COMPARISON:  X-ray lumbar 02/17/2012.   FINDINGS: Segmentation:  Standard.  Hypoplastic ribs at T12.   Alignment:  Unchanged 3 mm anterolisthesis at L4-L5.   Vertebrae:  No fracture, evidence of discitis, or bone lesion.   Conus medullaris and cauda equina: Conus extends to the L1-L2 level. Conus and cauda equina appear normal.   Paraspinal and other soft tissues: Negative.   Disc levels:   T12-L1:  Negative.   L1-L2:  Minimal disc bulging.  No stenosis.   L2-L3: Mild disc bulging and endplate spurring. Mild left facet arthropathy. Mild bilateral lateral recess stenosis. Mild left neuroforaminal stenosis. No spinal canal or right neuroforaminal stenosis.   L3-L4: Mild disc bulging and endplate spurring asymmetric to the left. Mild bilateral facet arthropathy. Moderate bilateral neuroforaminal stenosis. No spinal canal stenosis.   L4-L5: Disc uncovering and mild disc bulging eccentric to the right. Severe bilateral facet arthropathy. Mild spinal canal stenosis. Severe right lateral recess stenosis. Moderate right and mild left neuroforaminal stenosis.   L5-S1: Right far lateral disc osteophyte complex encroaches on the exiting right L5 nerve root. Mild bilateral facet arthropathy. No stenosis.   IMPRESSION: 1. Multilevel degenerative changes of the lumbar spine as described above. Severe right lateral recess stenosis at L4-L5 with impingement of the descending right L5 nerve root. 2. Moderate bilateral neuroforaminal stenosis at L3-L4. Moderate right neuroforaminal stenosis at L4-L5. 3. Right far lateral disc osteophyte complex at L5-S1 encroaches on and potentially irritates the exiting right L5 nerve root.     Electronically Signed   By: Titus Dubin  M.D.   On: 01/07/2021 14:16   He reports that he has been smoking cigarettes. He has a 60.00 pack-year smoking history. He has never used smokeless tobacco.  Recent Labs    05/05/20 0837 11/06/20 0830  HGBA1C 6.6* 6.5*    Objective:  VS:  HT:    WT:   BMI:     BP: (!) 168/93  HR:91bpm  TEMP: ( )  RESP:  Physical Exam Vitals and nursing note reviewed.  HENT:     Head: Normocephalic and atraumatic.     Right Ear: External ear normal.     Left Ear: External ear normal.     Nose: Nose normal.     Mouth/Throat:     Mouth: Mucous membranes are moist.  Eyes:     Extraocular Movements: Extraocular movements intact.  Cardiovascular:     Rate and Rhythm: Normal rate.     Pulses: Normal pulses.  Pulmonary:     Effort: Pulmonary effort is normal.  Abdominal:     General: Abdomen is flat. There is no distension.  Musculoskeletal:        General: Tenderness present.     Cervical back: Normal range of motion.     Right lower leg: 1+ Edema present.     Left lower leg: 1+ Edema present.     Comments: Pt is slow to rise from  seated position to standing. Good lumbar range of motion. Strong distal strength without clonus, no pain upon palpation of greater trochanters. Sensation intact bilaterally. Dysesthesias noted to bilateral L5 dermatomes. Ambulates with cane, gait slow and unsteady. Positive slump test.    Skin:    General: Skin is warm and dry.     Capillary Refill: Capillary refill takes less than 2 seconds.  Neurological:     Mental Status: He is alert.     Gait: Gait abnormal.  Psychiatric:        Mood and Affect: Mood normal.    Ortho Exam  Imaging: No results found.  Past Medical/Family/Surgical/Social History: Medications & Allergies reviewed per EMR, new medications updated. Patient Active Problem List   Diagnosis Date Noted   Posterior vitreous detachment of left eye 12/03/2019   Early stage nonexudative age-related macular degeneration of both eyes 12/03/2019    Pseudophakia 12/03/2019   Posterior vitreous detachment of right eye 12/03/2019   BPH with urinary obstruction 02/06/2019   Need for prophylactic vaccination and inoculation against influenza 11/15/2017   CAP (community acquired pneumonia) 03/02/2016   Acute respiratory failure ([place]) 03/02/2016   Acute exacerbation of chronic obstructive pulmonary disease (COPD) ([place]) 03/02/2016   Essential hypertension 03/02/2016   Ptosis 10/15/2013   Blurred vision, bilateral 10/15/2013   Weakness 10/15/2013   Diabetes mellitus without complication [facility])    Past Medical History:  Diagnosis Date   Arthritis    "lower back" (03/02/2016)   CAP (community acquired pneumonia) 2018   Right Middle Lobe   Colon polyp    COPD (chronic obstructive pulmonary disease) ([place]) dx'd 03/02/2016   Dermatochalasis    Diabetes mellitus without complication ([place])    Fatty liver    High blood pressure    High cholesterol    History of BPH    History of colon polyps    HOH (hard of hearing)    Nicotine abuse    Right carotid bruit    Family History  Problem Relation Age of Onset   Heart attack Father    Heart attack Sister    Lung cancer Sister    Past Surgical History:  Procedure Laterality Date   BLEPHAROPLASTY Bilateral    CATARACT EXTRACTION W/ INTRAOCULAR LENS  IMPLANT, BILATERAL     COLONOSCOPY     TRANSURETHRAL RESECTION OF PROSTATE N/A 02/06/2019   Procedure: TRANSURETHRAL RESECTION OF THE PROSTATE (TURP);  Surgeon: Irine Seal, MD;  Location: [facility];  Service: Urology;  Laterality: N/A;   Social History   Occupational History   Occupation: RETIRED  Tobacco Use   Smoking status: Every Day    Packs/day: 1.00    Years: 60.00    Pack years: 60.00    Types: Cigarettes   Smokeless tobacco: Never  Vaping Use   Vaping Use: Never used  Substance and Sexual Activity   Alcohol use: Not Currently    Comment: 03/02/2016 "nothing since 1973"   Drug use: No   Sexual activity: Never

## 2021-02-17 ENCOUNTER — Ambulatory Visit: Payer: Medicare HMO | Admitting: Physical Medicine and Rehabilitation

## 2021-02-19 ENCOUNTER — Ambulatory Visit (INDEPENDENT_AMBULATORY_CARE_PROVIDER_SITE_OTHER): Payer: Medicare HMO

## 2021-02-19 ENCOUNTER — Ambulatory Visit: Payer: Self-pay

## 2021-02-19 ENCOUNTER — Other Ambulatory Visit: Payer: Self-pay

## 2021-02-19 ENCOUNTER — Encounter: Payer: Self-pay | Admitting: Physical Medicine and Rehabilitation

## 2021-02-19 ENCOUNTER — Ambulatory Visit (INDEPENDENT_AMBULATORY_CARE_PROVIDER_SITE_OTHER): Payer: Medicare HMO | Admitting: Physical Medicine and Rehabilitation

## 2021-02-19 VITALS — Ht 71.0 in | Wt 208.0 lb

## 2021-02-19 VITALS — BP 161/93 | HR 98

## 2021-02-19 DIAGNOSIS — M5416 Radiculopathy, lumbar region: Secondary | ICD-10-CM

## 2021-02-19 DIAGNOSIS — Z0000 Encounter for general adult medical examination without abnormal findings: Secondary | ICD-10-CM

## 2021-02-19 MED ORDER — METHYLPREDNISOLONE ACETATE 80 MG/ML IJ SUSP
80.0000 mg | Freq: Once | INTRAMUSCULAR | Status: AC
  Administered 2021-02-19: 80 mg

## 2021-02-19 NOTE — Progress Notes (Signed)
Pt state lower back pain that travels down both legs. Pt state walking and standing makes the pain worse. Pt state he takes over the counter pain meds to help ease his pain.  Numeric Pain Rating Scale and Functional Assessment Average Pain 8   In the last MONTH (on 0-10 scale) has pain interfered with the following?  1. General activity like being  able to carry out your everyday physical activities such as walking, climbing stairs, carrying groceries, or moving a chair?  Rating(10)   +Driver, -BT, -Dye Allergies.

## 2021-02-19 NOTE — Progress Notes (Signed)
Subjective:   Scott Ward is a 80 y.o. male who presents for Medicare Annual/Subsequent preventive examination. Virtual Visit via Telephone Note  I connected with  EISSA BUCHBERGER on 02/19/21 at 10:30 AM EST by telephone and verified that I am speaking with the correct person using two identifiers.  Location: Patient: Home Provider: [facility] Persons participating in the virtual visit: patient/Nurse Health Advisor   I discussed the limitations, risks, security and privacy concerns of performing an evaluation and management service by telephone and the availability of in person appointments. The patient expressed understanding and agreed to proceed.  Interactive audio and video telecommunications were attempted between this nurse and patient, however failed, due to patient having technical difficulties OR patient did not have access to video capability.  We continued and completed visit with audio only.  Some vital signs may be absent or patient reported.   Chriss Driver, LPN  Review of Systems     Cardiac Risk Factors include: advanced age (>45men, >62 women);diabetes mellitus;hypertension;dyslipidemia;sedentary lifestyle;male gender     Objective:    Today's Vitals   02/19/21 1032 02/19/21 1034  Weight: 208 lb (94.3 kg)   Height: 5\' 11"  (1.803 m)   PainSc:  6    Body mass index is 29.01 kg/m.  Advanced Directives 02/19/2021 11/08/2019 09/27/2019 02/06/2019 02/02/2019 03/02/2016 03/02/2016  Does Patient Have a Medical Advance Directive? No Yes No Yes Yes Yes Yes  Type of Advance Directive - [place];Living will - [place];Living will [place];Living will [place];Living will [place];Living will  Does patient want to make changes to medical advance directive? No - Patient declined - - No - Patient declined No - Patient declined No - Patient declined -  Copy of [place] in Chart? - - - No - copy requested No - copy requested No - copy requested -  Would patient like information on creating a medical advance directive? No - Patient declined - No - Patient declined - - - -    Current Medications (verified) Outpatient Encounter Medications as of 02/19/2021  Medication Sig   albuterol (VENTOLIN HFA) 108 (90 Base) MCG/ACT inhaler Inhale 2 puffs into the lungs every 6 (six) hours as needed for wheezing or shortness of breath.   aspirin 81 MG tablet Take 81 mg by mouth daily.     finasteride (PROSCAR) 5 MG tablet Take 1 tablet (5 mg total) by mouth daily.   Fluticasone-Umeclidin-Vilant (TRELEGY ELLIPTA) 100-62.5-25 MCG/INH AEPB Inhale 1 Inhaler into the lungs daily.   furosemide (LASIX) 40 MG tablet Take 1 tablet (40 mg total) by mouth daily as needed for edema. (Patient taking differently: Take 40 mg by mouth daily.)   lisinopril (ZESTRIL) 40 MG tablet Take 1 tablet (40 mg total) by mouth daily.   metFORMIN (GLUCOPHAGE) 500 MG tablet Take 1 tablet (500 mg total) by mouth 2 (two) times daily with a meal.   potassium chloride SA (KLOR-CON M20) 20 MEQ tablet Take 1 tablet (20 mEq total) by mouth daily.   simvastatin (ZOCOR) 20 MG tablet Take 1 tablet (20 mg total) by mouth at bedtime.   No facility-administered encounter medications on file as of 02/19/2021.    Allergies (verified) Patient has no known allergies.   History: Past Medical History:  Diagnosis Date   Arthritis    "lower back" (03/02/2016)   CAP (community acquired pneumonia) 2018   Right Middle  Lobe   Colon polyp    COPD (chronic obstructive pulmonary disease) ([place]) dx'd 03/02/2016   Dermatochalasis    Diabetes mellitus without complication (HCC)    Fatty liver    High blood pressure    High cholesterol    History of BPH    History of colon polyps    HOH (hard of hearing)    Nicotine abuse    Right carotid bruit    Past Surgical History:  Procedure Laterality Date    BLEPHAROPLASTY Bilateral    CATARACT EXTRACTION W/ INTRAOCULAR LENS  IMPLANT, BILATERAL     COLONOSCOPY     TRANSURETHRAL RESECTION OF PROSTATE N/A 02/06/2019   Procedure: TRANSURETHRAL RESECTION OF THE PROSTATE (TURP);  Surgeon: Irine Seal, MD;  Location: [facility];  Service: Urology;  Laterality: N/A;   Family History  Problem Relation Age of Onset   Heart attack Father    Heart attack Sister    Lung cancer Sister    Social History   Socioeconomic History   Marital status: Divorced    Spouse name: Not on file   Number of children: 1   Years of education: 8   Highest education level: Not on file  Occupational History   Occupation: RETIRED  Tobacco Use   Smoking status: Every Day    Packs/day: 1.00    Years: 60.00    Pack years: 60.00    Types: Cigarettes   Smokeless tobacco: Never  Vaping Use   Vaping Use: Never used  Substance and Sexual Activity   Alcohol use: Not Currently    Comment: 03/02/2016 "nothing since 1973"   Drug use: No   Sexual activity: Never  Other Topics Concern   Not on file  Social History Narrative   Patient is single with one child.   Patient is left handed.   Patient has an 8 th grade education.   Patient drinks up to 3 cups daily.   Social Determinants of Health   Financial Resource Strain: Low Risk    Difficulty of Paying Living Expenses: Not hard at all  Food Insecurity: No Food Insecurity   Worried About Charity fundraiser in the Last Year: Never true   [place] in the Last Year: Never true  Transportation Needs: No Transportation Needs   Lack of Transportation (Medical): No   Lack of Transportation (Non-Medical): No  Physical Activity: Inactive   Days of Exercise per Week: 0 days   Minutes of Exercise per Session: 0 min  Stress: No Stress Concern Present   Feeling of Stress : Not at all  Social Connections: Socially Isolated   Frequency of Communication with Friends and Family: More than three times a week   Frequency of  Social Gatherings with Friends and Family: More than three times a week   Attends Religious Services: Never   Marine scientist or Organizations: No   Attends Music therapist: Never   Marital Status: Divorced    Tobacco Counseling Ready to quit: Not Answered Counseling given: Not Answered   Clinical Intake:  Pre-visit preparation completed: Yes  Pain : 0-10 Pain Score: 6  Pain Type: Chronic pain Pain Location: Knee Pain Descriptors / Indicators: Aching Pain Onset: More than a month ago Pain Frequency: Intermittent     BMI - recorded: 29.01 Nutritional Status: BMI 25 -29 Overweight Nutritional Risks: None Diabetes: Yes  How often do you need to have someone help you when you read instructions, pamphlets,  or other written materials from your doctor or pharmacy?: 1 - Never  Diabetic?Nutrition Risk Assessment:  Has the patient had any N/V/D within the last 2 months?  No  Does the patient have any non-healing wounds?  No  Has the patient had any unintentional weight loss or weight gain?  No   Diabetes:  Is the patient diabetic?  Yes  If diabetic, was a CBG obtained today?  No  Did the patient bring in their glucometer from home?  No  How often do you monitor your CBG's? Monthly.   Financial Strains and Diabetes Management:  Are you having any financial strains with the device, your supplies or your medication? Yes .  Does the patient want to be seen by Chronic Care Management for management of their diabetes?   Already under CCM Would the patient like to be referred to a Nutritionist or for Diabetic Management?  No   Diabetic Exams:  Diabetic Eye Exam: Completed 12/03/2019. Overdue for diabetic eye exam. Pt has been advised about the importance in completing this exam.  Diabetic Foot Exam: Completed 04/30/2019. Pt has been advised about the importance in completing this exam.   Interpreter Needed?: No  Information entered by :: MJ Malessa Zartman,  LPN   Activities of Daily Living In your present state of health, do you have any difficulty performing the following activities: 02/19/2021  Hearing? Y  Vision? N  Difficulty concentrating or making decisions? Y  Comment Some memory issues.  Walking or climbing stairs? N  Dressing or bathing? N  Doing errands, shopping? N  Preparing Food and eating ? N  Using the Toilet? N  In the past six months, have you accidently leaked urine? N  Do you have problems with loss of bowel control? N  Managing your Medications? N  Managing your Finances? N  Housekeeping or managing your Housekeeping? N  Some recent data might be hidden    Patient Care Team: Susy Frizzle, MD as PCP - General (Family Medicine) Edythe Clarity, [facility] as Pharmacist (Pharmacist)  Indicate any recent Medical Services you may have received from other than [facility] providers in the past year (date may be approximate).     Assessment:   This is a routine wellness examination for Antonios.  Hearing/Vision screen Hearing Screening - Comments:: Hearing issues. No hearing aids.  Vision Screening - Comments:: Readers. 12/03/2019. Dr. Zadie Rhine.  Dietary issues and exercise activities discussed: Current Exercise Habits: The patient does not participate in regular exercise at present, Exercise limited by: cardiac condition(s)   Goals Addressed             This Visit's Progress    Exercise 3x per week (30 min per time)       Try to do chair exercises.       Depression Screen PHQ 2/9 Scores 02/19/2021 11/08/2019 10/30/2018 11/15/2017 03/29/2017 03/29/2017 06/21/2016  PHQ - 2 Score 0 0 0 0 0 0 0  PHQ- 9 Score - - - - - - 0    Fall Risk Fall Risk  02/19/2021 11/08/2019 10/30/2018 11/15/2017 03/29/2017  Falls in the past year? 0 1 0 No No  Comment - 09/29/2019 - - -  Number falls in past yr: 0 0 - - -  Injury with Fall? 0 1 - - -  Risk for fall due to : Impaired balance/gait;Impaired mobility;Impaired vision - - - -  Follow  up Falls prevention discussed - Falls evaluation completed - -  FALL RISK PREVENTION PERTAINING TO THE HOME:  Any stairs in or around the home? Yes  If so, are there any without handrails? No  Home free of loose throw rugs in walkways, pet beds, electrical cords, etc? Yes  Adequate lighting in your home to reduce risk of falls? Yes   ASSISTIVE DEVICES UTILIZED TO PREVENT FALLS:  Life alert? No  Use of a cane, walker or w/c? No  Grab bars in the bathroom? Yes  Shower chair or bench in shower? Yes  Elevated toilet seat or a handicapped toilet? No   TIMED UP AND GO:  Was the test performed? No .  Phone visit.  Cognitive Function: Normal cognitive status assessed by direct observation by this Nurse Health Advisor. No abnormalities found.       6CIT Screen 11/08/2019  What Year? 0 points  What month? 0 points  What time? 0 points  Count back from 20 0 points  Months in reverse 0 points  Repeat phrase 0 points  Total Score 0    Immunizations Immunization History  Administered Date(s) Administered   Fluad Quad(high Dose 65+) 10/26/2018, 11/08/2019, 12/15/2020   Influenza Whole 12/22/2007, 12/25/2008, 01/07/2010   Influenza, High Dose Seasonal PF 01/04/2017   Influenza,inj,Quad PF,6+ Mos 01/18/2013, 05/17/2014, 11/22/2014, 03/03/2016, 11/15/2017   Moderna Sars-Covid-2 Vaccination 05/07/2019, 06/04/2019, 02/18/2020   Pneumococcal Conjugate-13 01/18/2013   Pneumococcal Polysaccharide-23 03/10/2004, 05/20/2014   Td 03/10/2004    TDAP status: Due, Education has been provided regarding the importance of this vaccine. Advised may receive this vaccine at local pharmacy or Health Dept. Aware to provide a copy of the vaccination record if obtained from local pharmacy or Health Dept. Verbalized acceptance and understanding.  Flu Vaccine status: Up to date  Pneumococcal vaccine status: Up to date  Covid-19 vaccine status: Completed vaccines  Qualifies for Shingles Vaccine? Yes    Zostavax completed No   Shingrix Completed?: No.    Education has been provided regarding the importance of this vaccine. Patient has been advised to call insurance company to determine out of pocket expense if they have not yet received this vaccine. Advised may also receive vaccine at local pharmacy or Health Dept. Verbalized acceptance and understanding.  Screening Tests Health Maintenance  Topic Date Due   Zoster Vaccines- Shingrix (1 of 2) Never done   TETANUS/TDAP  03/10/2014   COVID-19 Vaccine (4 - Booster for Moderna series) 04/14/2020   FOOT EXAM  04/29/2020   OPHTHALMOLOGY EXAM  12/02/2020   HEMOGLOBIN A1C  05/06/2021   Pneumonia Vaccine 80+ Years old  Completed   INFLUENZA VACCINE  Completed   HPV VACCINES  Aged Out    Health Maintenance  Health Maintenance Due  Topic Date Due   Zoster Vaccines- Shingrix (1 of 2) Never done   TETANUS/TDAP  03/10/2014   COVID-19 Vaccine (4 - Booster for Moderna series) 04/14/2020   FOOT EXAM  04/29/2020   OPHTHALMOLOGY EXAM  12/02/2020    Colorectal cancer screening: No longer required.   Lung Cancer Screening: (Low Dose CT Chest recommended if Age 52-80 years, 30 pack-year currently smoking OR have quit w/in 15years.) does not qualify.    Additional Screening:  Hepatitis C Screening: does not qualify;   Vision Screening: Recommended annual ophthalmology exams for early detection of glaucoma and other disorders of the eye. Is the patient up to date with their annual eye exam?  No  Who is the provider or what is the name of the office in which the  patient attends annual eye exams? Dr. Zadie Rhine If pt is not established with a provider, would they like to be referred to a provider to establish care? No .   Dental Screening: Recommended annual dental exams for proper oral hygiene  Community Resource Referral / Chronic Care Management: CRR required this visit?  No   CCM required this visit?  No      Plan:     I have  personally reviewed and noted the following in the patients chart:   Medical and social history Use of alcohol, tobacco or illicit drugs  Current medications and supplements including opioid prescriptions. Patient is not currently taking opioid prescriptions. Functional ability and status Nutritional status Physical activity Advanced directives List of other physicians Hospitalizations, surgeries, and ER visits in previous 12 months Vitals Screenings to include cognitive, depression, and falls Referrals and appointments  In addition, I have reviewed and discussed with patient certain preventive protocols, quality metrics, and best practice recommendations. A written personalized care plan for preventive services as well as general preventive health recommendations were provided to patient.     Chriss Driver, LPN   [id number]   Nurse Notes: Pt up to date on age appropriate health maintenance and vaccines. Is overdue for eye exam and diabetic foot exam. Discussed with patient and son, [name].

## 2021-02-19 NOTE — Patient Instructions (Signed)

## 2021-02-19 NOTE — Patient Instructions (Signed)
Scott Ward , Thank you for taking time to come for your Medicare Wellness Visit. I appreciate your ongoing commitment to your health goals. Please review the following plan we discussed and let me know if I can assist you in the future.   Screening recommendations/referrals: Colonoscopy: No longer required due to age.  Recommended yearly ophthalmology/optometry visit for glaucoma screening and checkup Recommended yearly dental visit for hygiene and checkup  Vaccinations: Influenza vaccine: Done 12/15/20 Repeat annually  Pneumococcal vaccine: Done 01/18/2013 and 05/20/2014 Tdap vaccine: Done 03/10/2004 Repeat in 10 years  Shingles vaccine: Shingrix discussed. Please contact your pharmacy for coverage information.     Covid-19: Done 05/07/2019, 06/04/2019 and 02/18/2020  Advanced directives: Advance directive discussed with you today. Even though you declined this today, please call our office should you change your mind, and we can give you the proper paperwork for you to fill out.   Conditions/risks identified: Aim for 30 minutes of exercise each day, drink 6-8 glasses of water and eat lots of fruits and vegetables.   Next appointment: Follow up in one year for your annual wellness visit. 2023.  Preventive Care 17 Years and Older, Male  Preventive care refers to lifestyle choices and visits with your health care provider that can promote health and wellness. What does preventive care include? A yearly physical exam. This is also called an annual well check. Dental exams once or twice a year. Routine eye exams. Ask your health care provider how often you should have your eyes checked. Personal lifestyle choices, including: Daily care of your teeth and gums. Regular physical activity. Eating a healthy diet. Avoiding tobacco and drug use. Limiting alcohol use. Practicing safe sex. Taking low doses of aspirin every day. Taking vitamin and mineral supplements as recommended by your  health care provider. What happens during an annual well check? The services and screenings done by your health care provider during your annual well check will depend on your age, overall health, lifestyle risk factors, and family history of disease. Counseling  Your health care provider may ask you questions about your: Alcohol use. Tobacco use. Drug use. Emotional well-being. Home and relationship well-being. Sexual activity. Eating habits. History of falls. Memory and ability to understand (cognition). Work and work Statistician. Screening  You may have the following tests or measurements: Height, weight, and BMI. Blood pressure. Lipid and cholesterol levels. These may be checked every 5 years, or more frequently if you are over 5 years old. Skin check. Lung cancer screening. You may have this screening every year starting at age 63 if you have a 30-pack-year history of smoking and currently smoke or have quit within the past 15 years. Fecal occult blood test (FOBT) of the stool. You may have this test every year starting at age 55. Flexible sigmoidoscopy or colonoscopy. You may have a sigmoidoscopy every 5 years or a colonoscopy every 10 years starting at age 76. Prostate cancer screening. Recommendations will vary depending on your family history and other risks. Hepatitis C blood test. Hepatitis B blood test. Sexually transmitted disease (STD) testing. Diabetes screening. This is done by checking your blood sugar (glucose) after you have not eaten for a while (fasting). You may have this done every 1-3 years. Abdominal aortic aneurysm (AAA) screening. You may need this if you are a current or former smoker. Osteoporosis. You may be screened starting at age 89 if you are at high risk. Talk with your health care provider about your test results, treatment options,  and if necessary, the need for more tests. Vaccines  Your health care provider may recommend certain vaccines, such  as: Influenza vaccine. This is recommended every year. Tetanus, diphtheria, and acellular pertussis (Tdap, Td) vaccine. You may need a Td booster every 10 years. Zoster vaccine. You may need this after age 37. Pneumococcal 13-valent conjugate (PCV13) vaccine. One dose is recommended after age 61. Pneumococcal polysaccharide (PPSV23) vaccine. One dose is recommended after age 43. Talk to your health care provider about which screenings and vaccines you need and how often you need them. This information is not intended to replace advice given to you by your health care provider. Make sure you discuss any questions you have with your health care provider. Document Released: 03/14/2015 Document Revised: 11/05/2015 Document Reviewed: 12/17/2014 Elsevier Interactive Patient Education  2017 [place] Prevention in the Home Falls can cause injuries. They can happen to people of all ages. There are many things you can do to make your home safe and to help prevent falls. What can I do on the outside of my home? Regularly fix the edges of walkways and driveways and fix any cracks. Remove anything that might make you trip as you walk through a door, such as a raised step or threshold. Trim any bushes or trees on the path to your home. Use bright outdoor lighting. Clear any walking paths of anything that might make someone trip, such as rocks or tools. Regularly check to see if handrails are loose or broken. Make sure that both sides of any steps have handrails. Any raised decks and porches should have guardrails on the edges. Have any leaves, snow, or ice cleared regularly. Use sand or salt on walking paths during winter. Clean up any spills in your garage right away. This includes oil or grease spills. What can I do in the bathroom? Use night lights. Install grab bars by the toilet and in the tub and shower. Do not use towel bars as grab bars. Use non-skid mats or decals in the tub or  shower. If you need to sit down in the shower, use a plastic, non-slip stool. Keep the floor dry. Clean up any water that spills on the floor as soon as it happens. Remove soap buildup in the tub or shower regularly. Attach bath mats securely with double-sided non-slip rug tape. Do not have throw rugs and other things on the floor that can make you trip. What can I do in the bedroom? Use night lights. Make sure that you have a light by your bed that is easy to reach. Do not use any sheets or blankets that are too big for your bed. They should not hang down onto the floor. Have a firm chair that has side arms. You can use this for support while you get dressed. Do not have throw rugs and other things on the floor that can make you trip. What can I do in the kitchen? Clean up any spills right away. Avoid walking on wet floors. Keep items that you use a lot in easy-to-reach places. If you need to reach something above you, use a strong step stool that has a grab bar. Keep electrical cords out of the way. Do not use floor polish or wax that makes floors slippery. If you must use wax, use non-skid floor wax. Do not have throw rugs and other things on the floor that can make you trip. What can I do with my stairs? Do not leave  any items on the stairs. Make sure that there are handrails on both sides of the stairs and use them. Fix handrails that are broken or loose. Make sure that handrails are as long as the stairways. Check any carpeting to make sure that it is firmly attached to the stairs. Fix any carpet that is loose or worn. Avoid having throw rugs at the top or bottom of the stairs. If you do have throw rugs, attach them to the floor with carpet tape. Make sure that you have a light switch at the top of the stairs and the bottom of the stairs. If you do not have them, ask someone to add them for you. What else can I do to help prevent falls? Wear shoes that: Do not have high heels. Have  rubber bottoms. Are comfortable and fit you well. Are closed at the toe. Do not wear sandals. If you use a stepladder: Make sure that it is fully opened. Do not climb a closed stepladder. Make sure that both sides of the stepladder are locked into place. Ask someone to hold it for you, if possible. Clearly mark and make sure that you can see: Any grab bars or handrails. First and last steps. Where the edge of each step is. Use tools that help you move around (mobility aids) if they are needed. These include: Canes. Walkers. Scooters. Crutches. Turn on the lights when you go into a dark area. Replace any light bulbs as soon as they burn out. Set up your furniture so you have a clear path. Avoid moving your furniture around. If any of your floors are uneven, fix them. If there are any pets around you, be aware of where they are. Review your medicines with your doctor. Some medicines can make you feel dizzy. This can increase your chance of falling. Ask your doctor what other things that you can do to help prevent falls. This information is not intended to replace advice given to you by your health care provider. Make sure you discuss any questions you have with your health care provider. Document Released: 12/12/2008 Document Revised: 07/24/2015 Document Reviewed: 03/22/2014 Elsevier Interactive Patient Education  2017 [employer].

## 2021-03-04 ENCOUNTER — Other Ambulatory Visit: Payer: Self-pay | Admitting: Family Medicine

## 2021-03-29 NOTE — Progress Notes (Signed)
Scott Ward - 81 y.o. male MRN [id number]  Date of birth: Apr 25, 1940  Office Visit Note: Visit Date: 02/19/2021 PCP: Susy Frizzle, MD Referred by: Susy Frizzle, MD  Subjective: Chief Complaint  Patient presents with   Lower Back - Pain   Left Leg - Pain   Right Leg - Pain   HPI:  Scott Ward is a 81 y.o. male who comes in today at the request of Barnet Pall, FNP for planned Bilateral L4-5 Lumbar Transforaminal epidural steroid injection with fluoroscopic guidance.  The patient has failed conservative care including home exercise, medications, time and activity modification.  This injection will be diagnostic and hopefully therapeutic.  Please see requesting physician notes for further details and justification.  ROS Otherwise per HPI.  Assessment & Plan: Visit Diagnoses:    ICD-10-CM   1. Lumbar radiculopathy  M54.16 XR C-ARM NO REPORT    Epidural Steroid injection    methylPREDNISolone acetate (DEPO-MEDROL) injection 80 mg      Plan: No additional findings.   Meds & Orders:  Meds ordered this encounter  Medications   methylPREDNISolone acetate (DEPO-MEDROL) injection 80 mg    Orders Placed This Encounter  Procedures   XR C-ARM NO REPORT   Epidural Steroid injection    Follow-up: Return if symptoms worsen or fail to improve.   Procedures: No procedures performed  Lumbosacral Transforaminal Epidural Steroid Injection - Sub-Pedicular Approach with Fluoroscopic Guidance  Patient: Scott Ward      Date of Birth: 1940/09/01 MRN: [id number] PCP: Susy Frizzle, MD      Visit Date: 02/19/2021   Universal Protocol:    Date/Time: 02/19/2021  Consent Given By: the patient  Position: PRONE  Additional Comments: Vital signs were monitored before and after the procedure. Patient was prepped and draped in the usual sterile fashion. The correct patient, procedure, and site was verified.   Injection Procedure Details:   Procedure diagnoses: Lumbar  radiculopathy [M54.16]    Meds Administered:  Meds ordered this encounter  Medications   methylPREDNISolone acetate (DEPO-MEDROL) injection 80 mg    Laterality: Bilateral  Location/Site: L4  Needle:5.0 in., 22 ga.  Short bevel or Quincke spinal needle  Needle Placement: Transforaminal  Findings:    -Comments: Excellent flow of contrast along the nerve, nerve root and into the epidural space.  Procedure Details: After squaring off the end-plates to get a true AP view, the C-arm was positioned so that an oblique view of the foramen as noted above was visualized. The target area is just inferior to the "nose of the scotty dog" or sub pedicular. The soft tissues overlying this structure were infiltrated with 2-3 ml. of 1% Lidocaine without Epinephrine.  The spinal needle was inserted toward the target using a "trajectory" view along the fluoroscope beam.  Under AP and lateral visualization, the needle was advanced so it did not puncture dura and was located close the 6 O'Clock position of the pedical in AP tracterory. Biplanar projections were used to confirm position. Aspiration was confirmed to be negative for CSF and/or blood. A 1-2 ml. volume of Isovue-250 was injected and flow of contrast was noted at each level. Radiographs were obtained for documentation purposes.   After attaining the desired flow of contrast documented above, a 0.5 to 1.0 ml test dose of 0.25% Marcaine was injected into each respective transforaminal space.  The patient was observed for 90 seconds post injection.  After no sensory deficits were reported, and normal  lower extremity motor function was noted,   the above injectate was administered so that equal amounts of the injectate were placed at each foramen (level) into the transforaminal epidural space.   Additional Comments:  No complications occurred Dressing: 2 x 2 sterile gauze and Band-Aid    Post-procedure details: Patient was observed during the  procedure. Post-procedure instructions were reviewed.  Patient left the clinic in stable condition.    Clinical History: MRI LUMBAR SPINE WITHOUT CONTRAST   TECHNIQUE: Multiplanar, multisequence MR imaging of the lumbar spine was performed. No intravenous contrast was administered.   COMPARISON:  X-ray lumbar 02/17/2012.   FINDINGS: Segmentation:  Standard.  Hypoplastic ribs at T12.   Alignment:  Unchanged 3 mm anterolisthesis at L4-L5.   Vertebrae:  No fracture, evidence of discitis, or bone lesion.   Conus medullaris and cauda equina: Conus extends to the L1-L2 level. Conus and cauda equina appear normal.   Paraspinal and other soft tissues: Negative.   Disc levels:   T12-L1:  Negative.   L1-L2:  Minimal disc bulging.  No stenosis.   L2-L3: Mild disc bulging and endplate spurring. Mild left facet arthropathy. Mild bilateral lateral recess stenosis. Mild left neuroforaminal stenosis. No spinal canal or right neuroforaminal stenosis.   L3-L4: Mild disc bulging and endplate spurring asymmetric to the left. Mild bilateral facet arthropathy. Moderate bilateral neuroforaminal stenosis. No spinal canal stenosis.   L4-L5: Disc uncovering and mild disc bulging eccentric to the right. Severe bilateral facet arthropathy. Mild spinal canal stenosis. Severe right lateral recess stenosis. Moderate right and mild left neuroforaminal stenosis.   L5-S1: Right far lateral disc osteophyte complex encroaches on the exiting right L5 nerve root. Mild bilateral facet arthropathy. No stenosis.   IMPRESSION: 1. Multilevel degenerative changes of the lumbar spine as described above. Severe right lateral recess stenosis at L4-L5 with impingement of the descending right L5 nerve root. 2. Moderate bilateral neuroforaminal stenosis at L3-L4. Moderate right neuroforaminal stenosis at L4-L5. 3. Right far lateral disc osteophyte complex at L5-S1 encroaches on and potentially irritates the  exiting right L5 nerve root.     Electronically Signed   By: Titus Dubin M.D.   On: 01/07/2021 14:16     Objective:  VS:  HT:     WT:    BMI:      BP:(!) 161/93   HR:98bpm   TEMP: ( )   RESP:  Physical Exam Vitals and nursing note reviewed.  Constitutional:      General: He is not in acute distress.    Appearance: Normal appearance. He is not ill-appearing.  HENT:     Head: Normocephalic and atraumatic.     Right Ear: External ear normal.     Left Ear: External ear normal.     Nose: No congestion.  Eyes:     Extraocular Movements: Extraocular movements intact.  Cardiovascular:     Rate and Rhythm: Normal rate.     Pulses: Normal pulses.  Pulmonary:     Effort: Pulmonary effort is normal. No respiratory distress.  Abdominal:     General: There is no distension.     Palpations: Abdomen is soft.  Musculoskeletal:        General: No tenderness or signs of injury.     Cervical back: Neck supple.     Right lower leg: No edema.     Left lower leg: No edema.     Comments: Patient has good distal strength without clonus.  Skin:  Findings: No erythema or rash.  Neurological:     General: No focal deficit present.     Mental Status: He is alert and oriented to person, place, and time.     Sensory: No sensory deficit.     Motor: No weakness or abnormal muscle tone.     Coordination: Coordination normal.  Psychiatric:        Mood and Affect: Mood normal.        Behavior: Behavior normal.     Imaging: No results found.

## 2021-03-29 NOTE — Procedures (Signed)
Lumbosacral Transforaminal Epidural Steroid Injection - Sub-Pedicular Approach with Fluoroscopic Guidance  Patient: Scott Ward      Date of Birth: [birth year] MRN: [id number] PCP: Susy Frizzle, MD      Visit Date: 02/19/2021   Universal Protocol:    Date/Time: 02/19/2021  Consent Given By: the patient  Position: PRONE  Additional Comments: Vital signs were monitored before and after the procedure. Patient was prepped and draped in the usual sterile fashion. The correct patient, procedure, and site was verified.   Injection Procedure Details:   Procedure diagnoses: Lumbar radiculopathy [M54.16]    Meds Administered:  Meds ordered this encounter  Medications   methylPREDNISolone acetate (DEPO-MEDROL) injection 80 mg    Laterality: Bilateral  Location/Site: L4  Needle:5.0 in., 22 ga.  Short bevel or Quincke spinal needle  Needle Placement: Transforaminal  Findings:    -Comments: Excellent flow of contrast along the nerve, nerve root and into the epidural space.  Procedure Details: After squaring off the end-plates to get a true AP view, the C-arm was positioned so that an oblique view of the foramen as noted above was visualized. The target area is just inferior to the "nose of the scotty dog" or sub pedicular. The soft tissues overlying this structure were infiltrated with 2-3 ml. of 1% Lidocaine without Epinephrine.  The spinal needle was inserted toward the target using a "trajectory" view along the fluoroscope beam.  Under AP and lateral visualization, the needle was advanced so it did not puncture dura and was located close the 6 O'Clock position of the pedical in AP tracterory. Biplanar projections were used to confirm position. Aspiration was confirmed to be negative for CSF and/or blood. A 1-2 ml. volume of Isovue-250 was injected and flow of contrast was noted at each level. Radiographs were obtained for documentation purposes.   After attaining the desired  flow of contrast documented above, a 0.5 to 1.0 ml test dose of 0.25% Marcaine was injected into each respective transforaminal space.  The patient was observed for 90 seconds post injection.  After no sensory deficits were reported, and normal lower extremity motor function was noted,   the above injectate was administered so that equal amounts of the injectate were placed at each foramen (level) into the transforaminal epidural space.   Additional Comments:  No complications occurred Dressing: 2 x 2 sterile gauze and Band-Aid    Post-procedure details: Patient was observed during the procedure. Post-procedure instructions were reviewed.  Patient left the clinic in stable condition.

## 2021-04-17 ENCOUNTER — Other Ambulatory Visit: Payer: Self-pay

## 2021-04-17 MED ORDER — METFORMIN HCL 500 MG PO TABS: 500.0000 mg | ORAL_TABLET | Freq: Two times a day (BID) | ORAL | 3 refills | Status: DC

## 2021-04-20 ENCOUNTER — Telehealth: Payer: Self-pay | Admitting: Pharmacist

## 2021-04-20 NOTE — Progress Notes (Signed)
° ° °  Chronic Care Management Pharmacy Assistant    Name: Scott Ward  MRN: [id number] DOB: [birth year]   Reason for Encounter: Reschedule missed Initial Visit with CPP from 08/20/20.   Called and spoke to patient's son [name] and rescheduled missed initial visit with CPP for 05/21/21 @ 11:00 am. He confirmed appointment date and time.    Future Appointments  Date Time Provider [place]  05/12/2021  8:15 AM Susy Frizzle, MD [facility] None  05/21/2021 11:00 AM [facility] PHARMACIST [facility] None  12/02/2021  8:00 AM [place], Clent Demark, MD [facility] None  02/25/2022 10:30 AM [facility]-NURSE HEALTH ADVISOR [facility] None     Jobe Gibbon, [facility] Clinical Pharmacist Assistant  [phone]

## 2021-05-01 ENCOUNTER — Telehealth: Payer: Self-pay

## 2021-05-01 MED ORDER — FINASTERIDE 5 MG PO TABS: 5.0000 mg | ORAL_TABLET | Freq: Every day | ORAL | 3 refills | Status: DC

## 2021-05-01 NOTE — Telephone Encounter (Signed)
Pharmacy faxed refill request for  ? ?finasteride (PROSCAR) 5 MG tablet [[id number]]  ? ?Dose: 5 mg Route: Oral Frequency: Daily  ?Dispense Quantity: 90 tablet Refills: 3   ?     ?Sig: Take 1 tablet (5 mg total) by mouth daily.  ?     ?Start Date: 04/29/20 End Date: --  ?Written Date: 04/29/20 Expiration Date: 04/29/21  ?Original Order:  finasteride (PROSCAR) 5 MG tablet [[id number]]  ? ?

## 2021-05-01 NOTE — Telephone Encounter (Signed)
Rx sent to pharmacy   

## 2021-05-04 ENCOUNTER — Ambulatory Visit: Payer: Medicare HMO | Admitting: Family Medicine

## 2021-05-06 ENCOUNTER — Ambulatory Visit: Payer: Medicare HMO | Admitting: Family Medicine

## 2021-05-12 ENCOUNTER — Encounter: Payer: Self-pay | Admitting: Family Medicine

## 2021-05-12 ENCOUNTER — Other Ambulatory Visit: Payer: Self-pay

## 2021-05-12 ENCOUNTER — Ambulatory Visit (INDEPENDENT_AMBULATORY_CARE_PROVIDER_SITE_OTHER): Payer: Medicare HMO | Admitting: Family Medicine

## 2021-05-12 VITALS — BP 142/88 | HR 84 | Temp 97.3°F | Resp 18 | Ht 71.0 in | Wt 203.0 lb

## 2021-05-12 DIAGNOSIS — E119 Type 2 diabetes mellitus without complications: Secondary | ICD-10-CM | POA: Diagnosis not present

## 2021-05-12 DIAGNOSIS — E7800 Pure hypercholesterolemia, unspecified: Secondary | ICD-10-CM

## 2021-05-12 DIAGNOSIS — N1832 Chronic kidney disease, stage 3b: Secondary | ICD-10-CM

## 2021-05-12 DIAGNOSIS — I10 Essential (primary) hypertension: Secondary | ICD-10-CM

## 2021-05-12 DIAGNOSIS — J438 Other emphysema: Secondary | ICD-10-CM

## 2021-05-12 MED ORDER — TRAMADOL HCL 50 MG PO TABS: 50.0000 mg | ORAL_TABLET | Freq: Three times a day (TID) | ORAL | 0 refills | Status: AC | PRN

## 2021-05-12 NOTE — Progress Notes (Signed)
? ?Subjective:  ? ? Patient ID: Scott Ward, male    DOB: Jan 19, 1941, 81 y.o.   MRN: [id number] ? ? ?Patient continues to suffer with bilateral leg pain.  He describes aching throbbing pain in his hips and thighs and lower legs.  We performed arterial studies that showed no evidence of peripheral vascular disease in the past.  An MRI did show however degenerative disc disease particularly at L4-L5 which could potentially be pinching nerves in his back.  He has tried epidural steroid injections with no improvement in the leg pain.  He is currently taking Tylenol.  His blood pressures consistently between 130 and 145 which is good for his age.  Unfortunately continues to smoke and has no desire to quit however he denies any cough or chest pain or pleurisy.  He is no longer wheezing on his exam.  The Trelegy seems to be working well.  His son does an excellent job of checking his blood sugars and his blood sugars are typically below 120.  He denies any hypoglycemic episodes.  He is having to use a half a fluid pill a day to control the swelling in his legs.  Today there is no peripheral edema. ?Past Medical History:  ?Diagnosis Date  ? Arthritis   ? "lower back" (03/02/2016)  ? CAP (community acquired pneumonia) 2018  ? Right Middle Lobe  ? Colon polyp   ? COPD (chronic obstructive pulmonary disease) ([place]) dx'd 03/02/2016  ? Dermatochalasis   ? Diabetes mellitus without complication ([place])   ? Fatty liver   ? High blood pressure   ? High cholesterol   ? History of BPH   ? History of colon polyps   ? HOH (hard of hearing)   ? Nicotine abuse   ? Right carotid bruit   ? ?Past Surgical History:  ?Procedure Laterality Date  ? BLEPHAROPLASTY Bilateral   ? CATARACT EXTRACTION W/ INTRAOCULAR LENS  IMPLANT, BILATERAL    ? COLONOSCOPY    ? TRANSURETHRAL RESECTION OF PROSTATE N/A 02/06/2019  ? Procedure: TRANSURETHRAL RESECTION OF THE PROSTATE (TURP);  Surgeon: Irine Seal, MD;  Location: [facility];  Service: Urology;  Laterality: N/A;   ? ?Current Outpatient Medications on File Prior to Visit  ?Medication Sig Dispense Refill  ? albuterol (VENTOLIN HFA) 108 (90 Base) MCG/ACT inhaler Inhale 2 puffs into the lungs every 6 (six) hours as needed for wheezing or shortness of breath. 18 g 1  ? aspirin 81 MG tablet Take 81 mg by mouth daily.      ? finasteride (PROSCAR) 5 MG tablet Take 1 tablet (5 mg total) by mouth daily. 90 tablet 3  ? Fluticasone-Umeclidin-Vilant (TRELEGY ELLIPTA) 100-62.5-25 MCG/INH AEPB Inhale 1 Inhaler into the lungs daily. 1 each 11  ? furosemide (LASIX) 40 MG tablet TAKE ONE TABLET BY MOUTH DAILY AS NEEDED FOR EDEMA 90 tablet 1  ? lisinopril (ZESTRIL) 40 MG tablet Take 1 tablet (40 mg total) by mouth daily. 90 tablet 3  ? metFORMIN (GLUCOPHAGE) 500 MG tablet Take 1 tablet (500 mg total) by mouth 2 (two) times daily with a meal. 180 tablet 3  ? potassium chloride SA (KLOR-CON M20) 20 MEQ tablet Take 1 tablet (20 mEq total) by mouth daily. 90 tablet 1  ? simvastatin (ZOCOR) 20 MG tablet Take 1 tablet (20 mg total) by mouth at bedtime. 90 tablet 1  ? ?No current facility-administered medications on file prior to visit.  ? ?No Known Allergies ? ?Social History  ? ?  Socioeconomic History  ? Marital status: Divorced  ?  Spouse name: Not on file  ? Number of children: 1  ? Years of education: 8  ? Highest education level: Not on file  ?Occupational History  ? Occupation: RETIRED  ?Tobacco Use  ? Smoking status: Every Day  ?  Packs/day: 1.00  ?  Years: 60.00  ?  Pack years: 60.00  ?  Types: Cigarettes  ? Smokeless tobacco: Never  ?Vaping Use  ? Vaping Use: Never used  ?Substance and Sexual Activity  ? Alcohol use: Not Currently  ?  Comment: 03/02/2016 "nothing since 1973"  ? Drug use: No  ? Sexual activity: Never  ?Other Topics Concern  ? Not on file  ?Social History Narrative  ? Patient is single with one child.  ? Patient is left handed.  ? Patient has an 8 th grade education.  ? Patient drinks up to 3 cups daily.  ? ?Social Determinants  of Health  ? ?Financial Resource Strain: Low Risk   ? Difficulty of Paying Living Expenses: Not hard at all  ?Food Insecurity: No Food Insecurity  ? Worried About Charity fundraiser in the Last Year: Never true  ? Ran Out of Food in the Last Year: Never true  ?Transportation Needs: No Transportation Needs  ? Lack of Transportation (Medical): No  ? Lack of Transportation (Non-Medical): No  ?Physical Activity: Inactive  ? Days of Exercise per Week: 0 days  ? Minutes of Exercise per Session: 0 min  ?Stress: No Stress Concern Present  ? Feeling of Stress : Not at all  ?Social Connections: Socially Isolated  ? Frequency of Communication with Friends and Family: More than three times a week  ? Frequency of Social Gatherings with Friends and Family: More than three times a week  ? Attends Religious Services: Never  ? Active Member of Clubs or Organizations: No  ? Attends Archivist Meetings: Never  ? Marital Status: Divorced  ?Intimate Partner Violence: Not At Risk  ? Fear of Current or Ex-Partner: No  ? Emotionally Abused: No  ? Physically Abused: No  ? Sexually Abused: No  ? ?Family History  ?Problem Relation Age of Onset  ? Heart attack Father   ? Heart attack Sister   ? Lung cancer Sister   ? ? ? ? ?Review of Systems  ?All other systems reviewed and are negative. ? ?   ?Objective:  ? Physical Exam ?Vitals reviewed.  ?Constitutional:   ?   General: He is not in acute distress. ?   Appearance: He is well-developed. He is not diaphoretic.  ?HENT:  ?   Head: Normocephalic and atraumatic.  ?   Right Ear: External ear normal.  ?   Left Ear: External ear normal.  ?   Nose: Nose normal.  ?   Mouth/Throat:  ?   Pharynx: No oropharyngeal exudate.  ?Eyes:  ?   General: No scleral icterus.    ?   Right eye: No discharge.     ?   Left eye: No discharge.  ?   Conjunctiva/sclera: Conjunctivae normal.  ?   Pupils: Pupils are equal, round, and reactive to light.  ?Neck:  ?   Vascular: No JVD.  ?Cardiovascular:  ?   Rate and  Rhythm: Normal rate and regular rhythm.  ?   Heart sounds: Normal heart sounds. No murmur heard. ?  No friction rub. No gallop.  ?Pulmonary:  ?   Effort: Pulmonary effort  is normal. No respiratory distress.  ?   Breath sounds: No stridor. No wheezing, rhonchi or rales.  ?Chest:  ?   Chest wall: No tenderness.  ?Abdominal:  ?   General: Bowel sounds are normal. There is no distension.  ?   Palpations: Abdomen is soft. There is no mass.  ?   Tenderness: There is no abdominal tenderness. There is no guarding or rebound.  ?Musculoskeletal:     ?   General: No tenderness or deformity. Normal range of motion.  ?   Cervical back: Normal range of motion and neck supple.  ?   Right lower leg: No edema.  ?   Left lower leg: No edema.  ?Lymphadenopathy:  ?   Cervical: No cervical adenopathy.  ?Skin: ?   General: Skin is warm.  ?   Coloration: Skin is not pale.  ?   Findings: No erythema or rash.  ?Neurological:  ?   Mental Status: He is alert and oriented to person, place, and time.  ?   Cranial Nerves: No cranial nerve deficit.  ?   Motor: No abnormal muscle tone.  ?   Coordination: Coordination normal.  ?   Deep Tendon Reflexes: Reflexes are normal and symmetric.  ?Psychiatric:     ?   Behavior: Behavior normal.     ?   Thought Content: Thought content normal.     ?   Judgment: Judgment normal.  ? ? ? ? ? ?   ?Assessment & Plan:  ?Controlled type 2 diabetes mellitus without complication, without long-term current use of insulin ([place]) - Plan: CBC with Differential/Platelet, Lipid panel, Microalbumin, urine, COMPLETE METABOLIC PANEL WITH GFR, Hemoglobin A1c ? ?Stage 3b chronic kidney disease ([place]) - Plan: CBC with Differential/Platelet, Lipid panel, Microalbumin, urine, COMPLETE METABOLIC PANEL WITH GFR, Hemoglobin A1c ? ?Other emphysema ([place]) ? ?Benign essential HTN - Plan: CBC with Differential/Platelet, Lipid panel, Microalbumin, urine, COMPLETE METABOLIC PANEL WITH GFR, Hemoglobin A1c ? ?Pure hypercholesterolemia - Plan:  CBC with Differential/Platelet, Lipid panel, Microalbumin, urine, COMPLETE METABOLIC PANEL WITH GFR, Hemoglobin A1c ?Blood pressure is acceptable for age.  Continue to encourage smoking cessation but the

## 2021-05-13 LAB — CBC WITH DIFFERENTIAL/PLATELET
Absolute Monocytes: 1020 {cells}/uL — ABNORMAL HIGH (ref 200–950)
Basophils Absolute: 61 {cells}/uL (ref 0–200)
Basophils Relative: 0.6 %
Eosinophils Absolute: 242 {cells}/uL (ref 15–500)
Eosinophils Relative: 2.4 %
HCT: 40.4 % (ref 38.5–50.0)
Hemoglobin: 13.5 g/dL (ref 13.2–17.1)
Lymphs Abs: 1576 {cells}/uL (ref 850–3900)
MCH: 32.4 pg (ref 27.0–33.0)
MCHC: 33.4 g/dL (ref 32.0–36.0)
MCV: 96.9 fL (ref 80.0–100.0)
MPV: 11.7 fL (ref 7.5–12.5)
Monocytes Relative: 10.1 %
Neutro Abs: 7201 {cells}/uL (ref 1500–7800)
Neutrophils Relative %: 71.3 %
Platelets: 229 10*3/uL (ref 140–400)
RBC: 4.17 Million/uL — ABNORMAL LOW (ref 4.20–5.80)
RDW: 13.4 % (ref 11.0–15.0)
Total Lymphocyte: 15.6 %
WBC: 10.1 10*3/uL (ref 3.8–10.8)

## 2021-05-13 LAB — LIPID PANEL
Cholesterol: 152 mg/dL
HDL: 39 mg/dL — ABNORMAL LOW
LDL Cholesterol (Calc): 81 mg/dL
Non-HDL Cholesterol (Calc): 113 mg/dL
Total CHOL/HDL Ratio: 3.9 (calc)
Triglycerides: 233 mg/dL — ABNORMAL HIGH

## 2021-05-13 LAB — COMPLETE METABOLIC PANEL WITHOUT GFR
AG Ratio: 1.8 (calc) (ref 1.0–2.5)
ALT: 11 U/L (ref 9–46)
AST: 13 U/L (ref 10–35)
Albumin: 4.3 g/dL (ref 3.6–5.1)
Alkaline phosphatase (APISO): 73 U/L (ref 35–144)
BUN/Creatinine Ratio: 17 (calc) (ref 6–22)
BUN: 35 mg/dL — ABNORMAL HIGH (ref 7–25)
CO2: 23 mmol/L (ref 20–32)
Calcium: 10 mg/dL (ref 8.6–10.3)
Chloride: 109 mmol/L (ref 98–110)
Creat: 2.07 mg/dL — ABNORMAL HIGH (ref 0.70–1.22)
Globulin: 2.4 g/dL (ref 1.9–3.7)
Glucose, Bld: 100 mg/dL — ABNORMAL HIGH (ref 65–99)
Potassium: 5.6 mmol/L — ABNORMAL HIGH (ref 3.5–5.3)
Sodium: 143 mmol/L (ref 135–146)
Total Bilirubin: 0.2 mg/dL (ref 0.2–1.2)
Total Protein: 6.7 g/dL (ref 6.1–8.1)
eGFR: 31 mL/min/{1.73_m2} — ABNORMAL LOW

## 2021-05-13 LAB — MICROALBUMIN, URINE: Microalb, Ur: 1.4 mg/dL

## 2021-05-13 LAB — HEMOGLOBIN A1C
Hgb A1c MFr Bld: 6.6 %{Hb} — ABNORMAL HIGH
Mean Plasma Glucose: 143 mg/dL
eAG (mmol/L): 7.9 mmol/L

## 2021-05-14 NOTE — Progress Notes (Deleted)
Chronic Care Management Pharmacy Note  05/14/2021 Name:  Scott Ward MRN:  [id number] DOB:  26-Jun-1940  Summary: ***  Recommendations/Changes made from today's visit: ***  Plan: ***   Subjective: Scott Ward is an 81 y.o. year old male who is a primary patient of [name], Priscille Heidelberg, MD.  The CCM team was consulted for assistance with disease management and care coordination needs.    {CCMTELEPHONEFACETOFACE:[id number]} for initial visit in response to provider referral for pharmacy case management and/or care coordination services.   Consent to Services:  The patient was given the following information about Chronic Care Management services today, agreed to services, and gave verbal consent: 1. CCM service includes personalized support from designated clinical staff supervised by the primary care provider, including individualized plan of care and coordination with other care providers 2. 24/7 contact phone numbers for assistance for urgent and routine care needs. 3. Service will only be billed when office clinical staff spend 20 minutes or more in a month to coordinate care. 4. Only one practitioner may furnish and bill the service in a calendar month. 5.The patient may stop CCM services at any time (effective at the end of the month) by phone call to the office staff. 6. The patient will be responsible for cost sharing (co-pay) of up to 20% of the service fee (after annual deductible is met). Patient agreed to services and consent obtained.  Patient Care Team: Donita Brooks, MD as PCP - General (Family Medicine) Erroll Luna, [facility] as Pharmacist (Pharmacist)  Recent office visits: ***  Recent consult visits: [facility] visits: {Hospital DC Yes/No:25215}   Objective:  Lab Results  Component Value Date   CREATININE 2.07 (H) 05/12/2021   BUN 35 (H) 05/12/2021   EGFR 31 (L) 05/12/2021   GFRNONAA 30 (L) 05/05/2020   GFRAA 34 (L) 05/05/2020   NA 143 05/12/2021    K 5.6 (H) 05/12/2021   CALCIUM 10.0 05/12/2021   CO2 23 05/12/2021   GLUCOSE 100 (H) 05/12/2021    Lab Results  Component Value Date/Time   HGBA1C 6.6 (H) 05/12/2021 08:45 AM   HGBA1C 6.5 (H) 11/06/2020 08:30 AM   MICROALBUR 1.4 05/12/2021 08:15 AM   MICROALBUR 2.7 05/05/2020 08:37 AM    Last diabetic Eye exam:  Lab Results  Component Value Date/Time   HMDIABEYEEXA No Retinopathy 12/06/2017 12:00 AM    Last diabetic Foot exam: No results found for: HMDIABFOOTEX   Lab Results  Component Value Date   CHOL 152 05/12/2021   HDL 39 (L) 05/12/2021   LDLCALC 81 05/12/2021   TRIG 233 (H) 05/12/2021   CHOLHDL 3.9 05/12/2021    Hepatic Function Latest Ref Rng & Units 05/12/2021 11/06/2020 05/05/2020  Total Protein 6.1 - 8.1 g/dL 6.7 6.7 6.5  Albumin 3.6 - 5.1 g/dL - - -  AST 10 - 35 U/L 13 12 14   ALT 9 - 46 U/L 11 9 13   Alk Phosphatase 40 - 115 U/L - - -  Total Bilirubin 0.2 - 1.2 mg/dL 0.2 0.3 0.3    Lab Results  Component Value Date/Time   TSH 0.180 (L) 03/03/2016 02:23 AM    CBC Latest Ref Rng & Units 05/12/2021 11/06/2020 05/05/2020  WBC 3.8 - 10.8 Thousand/uL 10.1 8.9 8.7  Hemoglobin 13.2 - 17.1 g/dL 29.5 28.4 13.2  Hematocrit 38.5 - 50.0 % 40.4 41.5 39.6  Platelets 140 - 400 Thousand/uL 229 246 224    No results found for: VD25OH  Clinical ASCVD: {YES/NO:21197} The ASCVD Risk score (Arnett DK, et al., 2019) failed to calculate for the following reasons:   The 2019 ASCVD risk score is only valid for ages 48 to 69    Depression screen PHQ 2/9 02/19/2021 11/08/2019 10/30/2018  Decreased Interest 0 0 0  Down, Depressed, Hopeless 0 0 0  PHQ - 2 Score 0 0 0  Altered sleeping - - -  Tired, decreased energy - - -  Change in appetite - - -  Feeling bad or failure about yourself  - - -  Trouble concentrating - - -  Moving slowly or fidgety/restless - - -  Suicidal thoughts - - -  PHQ-9 Score - - -  Difficult doing work/chores - - -     ***Other: (CHADS2VASc if Afib, MMRC  or CAT for COPD, ACT, DEXA)  Social History   Tobacco Use  Smoking Status Every Day   Packs/day: 1.00   Years: 60.00   Pack years: 60.00   Types: Cigarettes  Smokeless Tobacco Never   BP Readings from Last 3 Encounters:  05/12/21 (!) 142/88  02/19/21 (!) 161/93  02/04/21 (!) 168/93   Pulse Readings from Last 3 Encounters:  05/12/21 84  02/19/21 98  02/04/21 91   Wt Readings from Last 3 Encounters:  05/12/21 203 lb (92.1 kg)  02/19/21 208 lb (94.3 kg)  11/06/20 208 lb (94.3 kg)   BMI Readings from Last 3 Encounters:  05/12/21 28.31 kg/m  02/19/21 29.01 kg/m  11/06/20 29.01 kg/m    Assessment/Interventions: Review of patient past medical history, allergies, medications, health status, including review of consultants reports, laboratory and other test data, was performed as part of comprehensive evaluation and provision of chronic care management services.   SDOH:  (Social Determinants of Health) assessments and interventions performed: {yes/no:20286}  SDOH Screenings   Alcohol Screen: Low Risk    Last Alcohol Screening Score (AUDIT): 0  Depression (PHQ2-9): Low Risk    PHQ-2 Score: 0  Financial Resource Strain: Low Risk    Difficulty of Paying Living Expenses: Not hard at all  Food Insecurity: No Food Insecurity   Worried About Programme researcher, broadcasting/film/video in the Last Year: Never true   Ran Out of Food in the Last Year: Never true  Housing: Low Risk    Last Housing Risk Score: 0  Physical Activity: Inactive   Days of Exercise per Week: 0 days   Minutes of Exercise per Session: 0 min  Social Connections: Socially Isolated   Frequency of Communication with Friends and Family: More than three times a week   Frequency of Social Gatherings with Friends and Family: More than three times a week   Attends Religious Services: Never   Database administrator or Organizations: No   Attends Engineer, structural: Never   Marital Status: Divorced  Stress: No Stress  Concern Present   Feeling of Stress : Not at all  Tobacco Use: High Risk   Smoking Tobacco Use: Every Day   Smokeless Tobacco Use: Never   Passive Exposure: Not on file  Transportation Needs: No Transportation Needs   Lack of Transportation (Medical): No   Lack of Transportation (Non-Medical): No    CCM Care Plan  No Known Allergies  Medications Reviewed Today     Reviewed by Dorna Mai, CMA (Certified Medical Assistant) on 05/12/21 at 0815  Med List Status: <None>   Medication Order Taking? Sig Documenting Provider Last Dose Status Informant  albuterol (VENTOLIN  HFA) 108 (90 Base) MCG/ACT inhaler [id number] Yes Inhale 2 puffs into the lungs every 6 (six) hours as needed for wheezing or shortness of breath. Donita Brooks, MD Taking Active   aspirin 81 MG tablet [id number] Yes Take 81 mg by mouth daily.   [provider] Taking Active Family Member           Med Note Anne Hahn, Raelyn Mora Apr 30, 2019  8:11 AM)    finasteride (PROSCAR) 5 MG tablet [id number] Yes Take 1 tablet (5 mg total) by mouth daily. Donita Brooks, MD Taking Active   Fluticasone-Umeclidin-Vilant (TRELEGY ELLIPTA) 100-62.5-25 MCG/INH AEPB [id number] Yes Inhale 1 Inhaler into the lungs daily. Donita Brooks, MD Taking Active   furosemide (LASIX) 40 MG tablet [id number] Yes TAKE ONE TABLET BY MOUTH DAILY AS NEEDED FOR EDEMA Donita Brooks, MD Taking Active   lisinopril (ZESTRIL) 40 MG tablet [id number] Yes Take 1 tablet (40 mg total) by mouth daily. Donita Brooks, MD Taking Active   metFORMIN (GLUCOPHAGE) 500 MG tablet [id number] Yes Take 1 tablet (500 mg total) by mouth 2 (two) times daily with a meal. Donita Brooks, MD Taking Active   potassium chloride SA (KLOR-CON M20) 20 MEQ tablet [id number] Yes Take 1 tablet (20 mEq total) by mouth daily. Donita Brooks, MD Taking Active   simvastatin (ZOCOR) 20 MG tablet [id number] Yes Take 1 tablet (20 mg total) by mouth at bedtime. Donita Brooks, MD Taking Active             Patient Active Problem List   Diagnosis Date Noted   Posterior vitreous detachment of left eye 12/03/2019   Early stage nonexudative age-related macular degeneration of both eyes 12/03/2019   Pseudophakia 12/03/2019   Posterior vitreous detachment of right eye 12/03/2019   BPH with urinary obstruction 02/06/2019   Need for prophylactic vaccination and inoculation against influenza 11/15/2017   CAP (community acquired pneumonia) 03/02/2016   Acute respiratory failure (HCC) 03/02/2016   Acute exacerbation of chronic obstructive pulmonary disease (COPD) (HCC) 03/02/2016   Essential hypertension 03/02/2016   Ptosis 10/15/2013   Blurred vision, bilateral 10/15/2013   Weakness 10/15/2013   Diabetes mellitus without complication (HCC)     Immunization History  Administered Date(s) Administered   Fluad Quad(high Dose 65+) 10/26/2018, 11/08/2019, 12/15/2020   Influenza Whole 12/22/2007, 12/25/2008, 01/07/2010   Influenza, High Dose Seasonal PF 01/04/2017   Influenza,inj,Quad PF,6+ Mos 01/18/2013, 05/17/2014, 11/22/2014, 03/03/2016, 11/15/2017   Moderna Sars-Covid-2 Vaccination 05/07/2019, 06/04/2019, 02/18/2020   Pneumococcal Conjugate-13 01/18/2013   Pneumococcal Polysaccharide-23 03/10/2004, 05/20/2014   Td 03/10/2004    Conditions to be addressed/monitored:  HTN, DM, COPD  There are no care plans that you recently modified to display for this patient.    Medication Assistance: {MEDASSISTANCEINFO:25044}  Compliance/Adherence/Medication fill history: Care Gaps: ***  Star-Rating Drugs: ***  Patient's preferred pharmacy is:  [facility] [id number] - [address] [place] - [address] [place] [address] Phone: [phone] Fax: [phone]  Uses pill box? {Yes or If no, why not?:20788} Pt endorses ***% compliance  We discussed: {Pharmacy options:24294} Patient decided to: {US Pharmacy  Plan:23885}  Care Plan and Follow Up Patient Decision:  {FOLLOWUP:24991}  Plan: {CM FOLLOW UP TGGY:69485}  ***  Current Barriers:  {pharmacybarriers:24917}  Pharmacist Clinical Goal(s):  Patient will {PHARMACYGOALCHOICES:24921} through collaboration with PharmD and provider.   Interventions: 1:1 collaboration with Donita Brooks, MD regarding development and update  of comprehensive plan of care as evidenced by provider attestation and co-signature Inter-disciplinary care team collaboration (see longitudinal plan of care) Comprehensive medication review performed; medication list updated in electronic medical record  Hypertension (BP goal {CHL HP UPSTREAM Pharmacist BP ranges:[phone]}) -{US controlled/uncontrolled:25276} -Current treatment: *** -Medications previously tried: ***  -Current home readings: *** -Current dietary habits: *** -Current exercise habits: *** -{ACTIONS;DENIES/REPORTS:21021675::"Denies"} hypotensive/hypertensive symptoms -Educated on {CCM BP Counseling:25124} -Counseled to monitor BP at home ***, document, and provide log at future appointments -{CCMPHARMDINTERVENTION:25122}  Diabetes (A1c goal {A1c goals:23924}) -{US controlled/uncontrolled:25276} -Current medications: *** -Medications previously tried: ***  -Current home glucose readings fasting glucose: *** post prandial glucose: *** -{ACTIONS;DENIES/REPORTS:21021675::"Denies"} hypoglycemic/hyperglycemic symptoms -Current meal patterns:  breakfast: ***  lunch: ***  dinner: *** snacks: *** drinks: *** -Current exercise: *** -Educated on {CCM DM COUNSELING:25123} -Counseled to check feet daily and get yearly eye exams -{CCMPHARMDINTERVENTION:25122}  COPD (Goal: control symptoms and prevent exacerbations) -{US controlled/uncontrolled:25276} -Current treatment  *** -Medications previously tried: ***  -Gold Grade: {CHL HP Upstream Pharm COPD Gold ZOXWR:[id number]} -Current COPD  Classification:  {CHL HP Upstream Pharm COPD Classification:[phone]} -MMRC/CAT score: *** -Pulmonary function testing: *** -Exacerbations requiring treatment in last 6 months: *** -Patient {Actions; denies-reports:120008} consistent use of maintenance inhaler -Frequency of rescue inhaler use: *** -Counseled on {CCMINHALERCOUNSELING:25121} -{CCMPHARMDINTERVENTION:25122}  Patient Goals/Self-Care Activities Patient will:  - {pharmacypatientgoals:24919}  Follow Up Plan: {CM FOLLOW UP BJYN:82956}

## 2021-05-18 ENCOUNTER — Telehealth: Payer: Self-pay | Admitting: Pharmacist

## 2021-05-18 NOTE — Progress Notes (Signed)
? ? ?Chronic Care Management ?Pharmacy Assistant  ? ?Name: Scott Ward  MRN: [id number] DOB: 1940/09/14 ? ? ?Reason for Encounter: Chart Review For Initial Visit With Clinical Pharmacist ?  ?Conditions to be addressed/monitored: ?HTN, COPD, DM ? ?Primary concerns for visit include: ?HTN, COPD, DM  ? ?Recent office visits:  ?05/12/2021 OV (PCP) Susy Frizzle, MD;  I will add tramadol 50 mg every 8 hours as needed for nerve pain in his legs.  If this does not help we can try gabapentin.  ? ?Recent consult visits:  ?02/04/2021 OV (Orthopedics) Magnus Sinning, MD; no medication changes indicated. ? ?Hospital visits:  ?None in previous 6 months ? ?Medications: ?Outpatient Encounter Medications as of 05/18/2021  ?Medication Sig  ? albuterol (VENTOLIN HFA) 108 (90 Base) MCG/ACT inhaler Inhale 2 puffs into the lungs every 6 (six) hours as needed for wheezing or shortness of breath.  ? aspirin 81 MG tablet Take 81 mg by mouth daily.    ? finasteride (PROSCAR) 5 MG tablet Take 1 tablet (5 mg total) by mouth daily.  ? Fluticasone-Umeclidin-Vilant (TRELEGY ELLIPTA) 100-62.5-25 MCG/INH AEPB Inhale 1 Inhaler into the lungs daily.  ? furosemide (LASIX) 40 MG tablet TAKE ONE TABLET BY MOUTH DAILY AS NEEDED FOR EDEMA  ? lisinopril (ZESTRIL) 40 MG tablet Take 1 tablet (40 mg total) by mouth daily.  ? metFORMIN (GLUCOPHAGE) 500 MG tablet Take 1 tablet (500 mg total) by mouth 2 (two) times daily with a meal.  ? potassium chloride SA (KLOR-CON M20) 20 MEQ tablet Take 1 tablet (20 mEq total) by mouth daily.  ? simvastatin (ZOCOR) 20 MG tablet Take 1 tablet (20 mg total) by mouth at bedtime.  ? ?No facility-administered encounter medications on file as of 05/18/2021.  ? ?Current Medications: ?Finasteride 5 mg last filled 05/01/2021 90 DS ?Metformin 500 mg last filled 04/17/2021 90 DS ?Furosemide 40 mg last filled 03/04/2021 90 DS ?Simvastatin 20 mg last filled 03/25/2021 90 DS ?Potassium Chloride 20 meq last filled 03/25/2021 90  DS ?Lisinopril 40 mg last filled 04/09/2021 90 DS ?Ventolin HFA 108 mcg last filled 05/05/2020 25 DS ?Trelegy Ellipta 100-62.5-25 mcg/Inh last filled 05/02/2021 30 DS ? ?Patient Questions: ?Any changes in your medications or health? ? ?Any side effects from any medications?  ? ?Do you have any symptoms or problems not managed by your medications? ? ?Any concerns about your health right now? ? ?Has your provider asked that you check blood pressure, blood sugar, or follow special diet at home? ? ?Do you get any type of exercise on a regular basis? ? ?Can you think of a goal you would like to reach for your health? ? ?Do you have any problems getting your medications? ? ?Is there anything that you would like to discuss during the appointment?  ? ?Please bring medications and supplements to appointment ? ?**Patient's son cancelled this appointment. He states he will be out of town during the time of this appointment. This appointment was rescheduled for 07/09/2021 at 11:00 am. ? ?Care Gaps: ?Medicare Annual Wellness: Last AWV 11/08/2019 ?Ophthalmology Exam: Overdue since 12/02/2020 ?Foot Exam: Overdue since 04/29/2020 ?Hemoglobin A1C: 6.6% on 05/12/2021 ?Colonoscopy: Completed 04/14/2004 ? ?Future Appointments  ?Date Time Provider [place]  ?05/21/2021 11:00 AM BSFM-CCM PHARMACIST [facility]  ?11/12/2021  8:00 AM Susy Frizzle, MD [facility]  ?12/02/2021  8:00 AM Rankin, Clent Demark, MD RDE-RDE None  ?02/25/2022 10:30 AM BSFM-NURSE HEALTH ADVISOR [facility] P[facility]  ? ? ?Star Rating Drugs: ?Metformin 500  mg last filled 04/17/2021 90 DS ?Simvastatin 20 mg last filled 03/25/2021 90 DS ?Lisinopril 40 mg last filled 04/09/2021 90 DS ? ?April D Calhoun, [place] ?Clinical Pharmacist Assistant ?[phone]  ?

## 2021-05-21 ENCOUNTER — Telehealth: Payer: Medicare HMO

## 2021-06-01 ENCOUNTER — Telehealth: Payer: Self-pay | Admitting: Family Medicine

## 2021-06-01 DIAGNOSIS — J441 Chronic obstructive pulmonary disease with (acute) exacerbation: Secondary | ICD-10-CM

## 2021-06-01 NOTE — Telephone Encounter (Signed)
Received eFax from pharmacy to request refill of ? ?Fluticasone-Umeclidin-Vilant (TRELEGY ELLIPTA) 100-62.5-25 MCG/INH AEPB [[id number]]  ? ?Pharmacy eFax received from:  ? ?[facility] [id number] [place], [place]  ?[address] [place], [place] [address]  ?Phone:  [phone]  Fax:  [phone]  ? ?Please advise pharmacist.  ?

## 2021-06-02 ENCOUNTER — Encounter: Payer: Self-pay | Admitting: Family Medicine

## 2021-06-02 MED ORDER — TRELEGY ELLIPTA 100-62.5-25 MCG/ACT IN AEPB: 1.0000 | INHALATION_SPRAY | Freq: Every day | RESPIRATORY_TRACT | 11 refills | Status: DC

## 2021-06-03 NOTE — Telephone Encounter (Signed)
Rx sent 06/02/21 ?

## 2021-06-22 IMAGING — CT CT HEAD W/O CM
4 series · 16 of 47 positions shown, 18 images · non-contrast
Comparison: None.

CLINICAL DATA: 79-year-old male with dizziness.  Fall.  Pain.

EXAM:
CT HEAD WITHOUT CONTRAST
TECHNIQUE: Contiguous axial images were obtained from the base of the skull
through the vertex without intravenous contrast.

[Series 3: head without · axial · non-contrast · 0.50mm/px · z∈[+623,+743]mm · 7 of 33 slices shown, 9 images]
[im 5/33  brain]
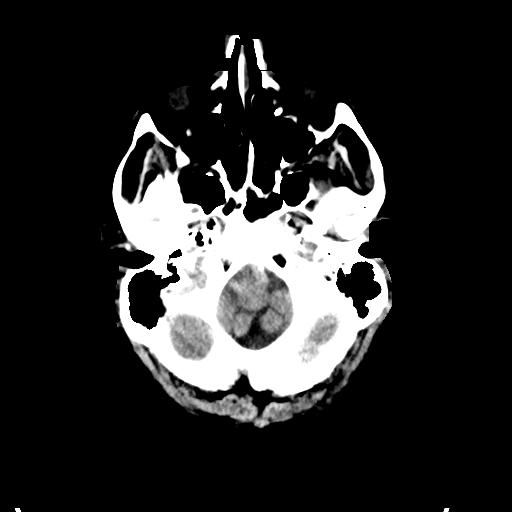
[im 5/33  bone]
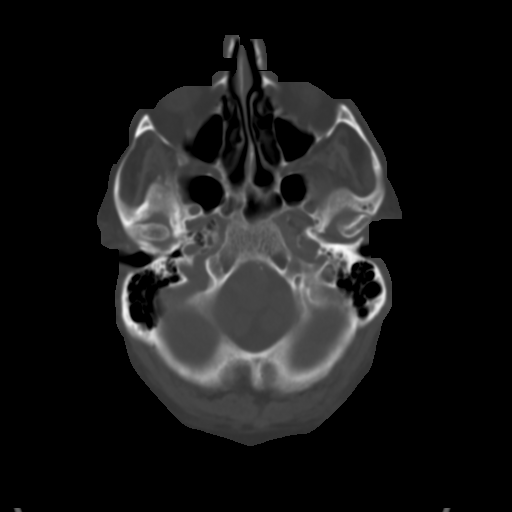
[im 9/33  brain]
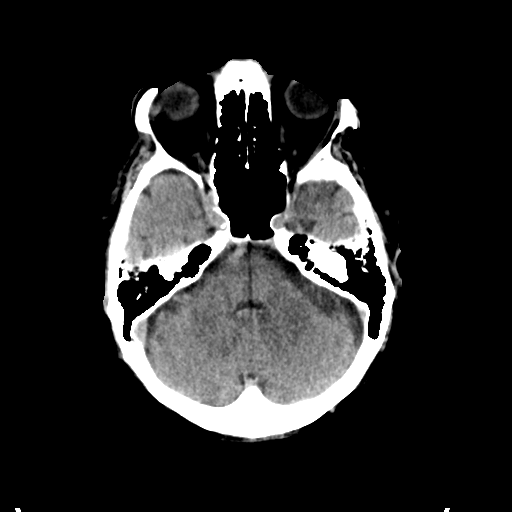
[im 13/33  brain]
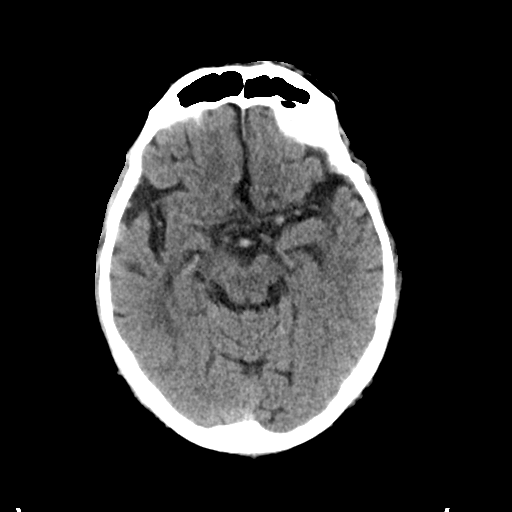
[im 17/33  brain]
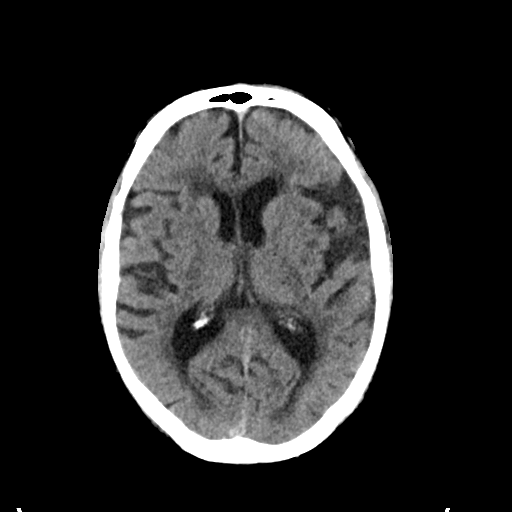
[im 21/33  brain]
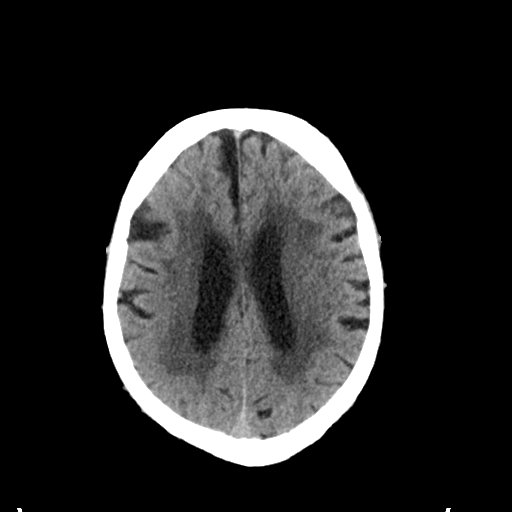
[im 21/33  bone]
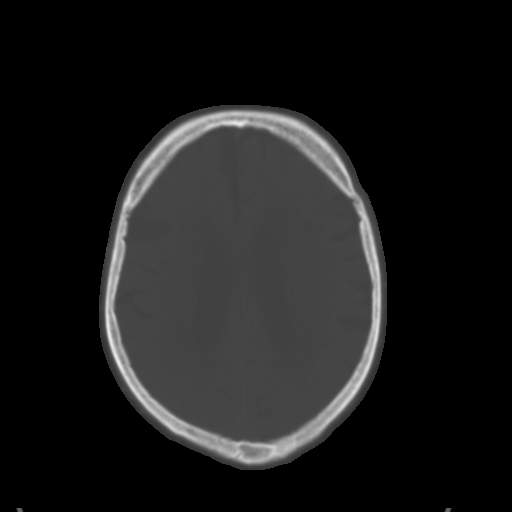
[im 25/33  brain]
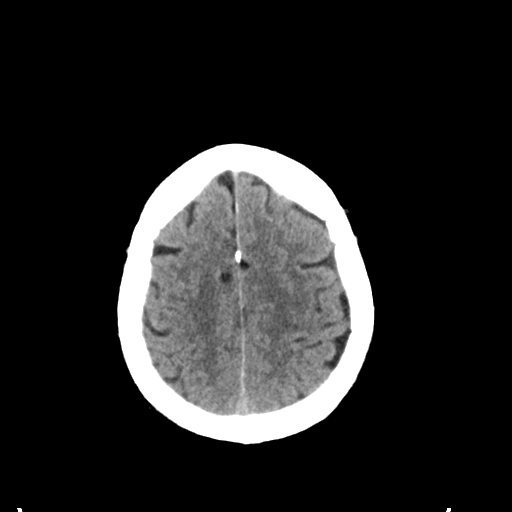
[im 29/33  brain]
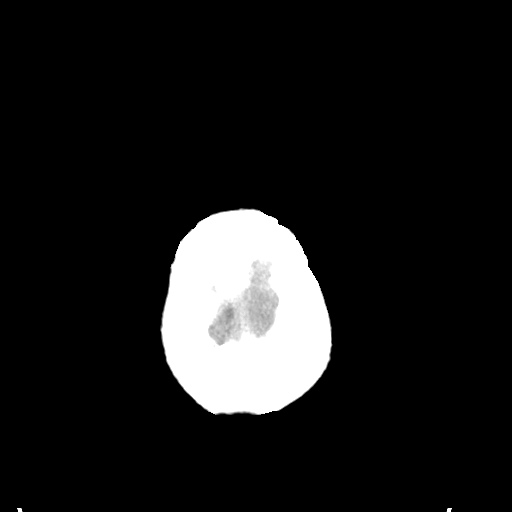

[Series 4: head bone · axial · 0.50mm/px · z∈[+619,+651]mm · 3 of 81 slices shown]
[im 9/81  bone]
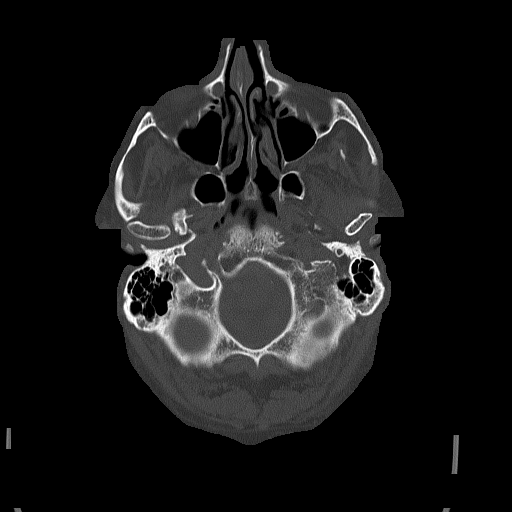
[im 17/81  bone]
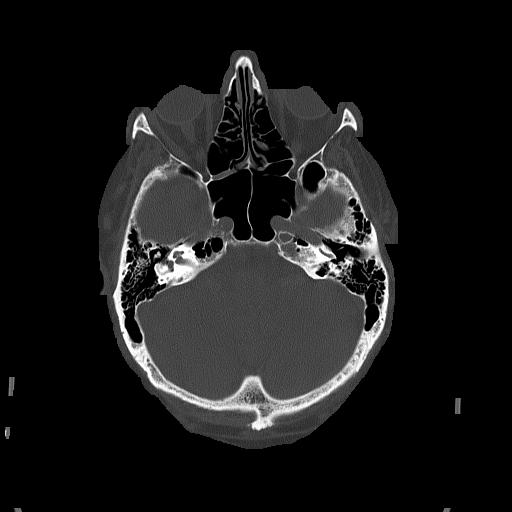
[im 25/81  bone]
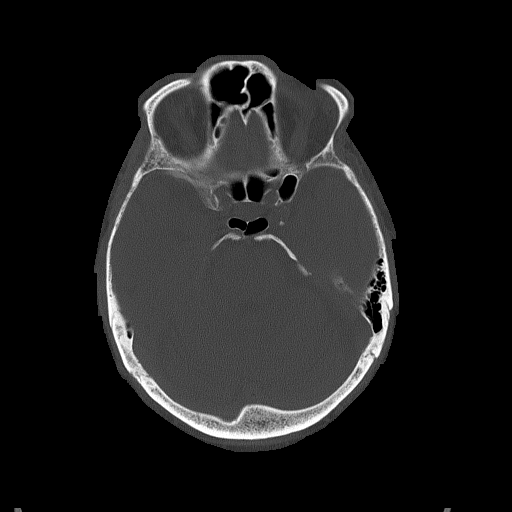

[Series 5: head without cor · coronal · non-contrast · 0.34mm/px · 3 of 77 slices shown]
[im 26/77  brain]
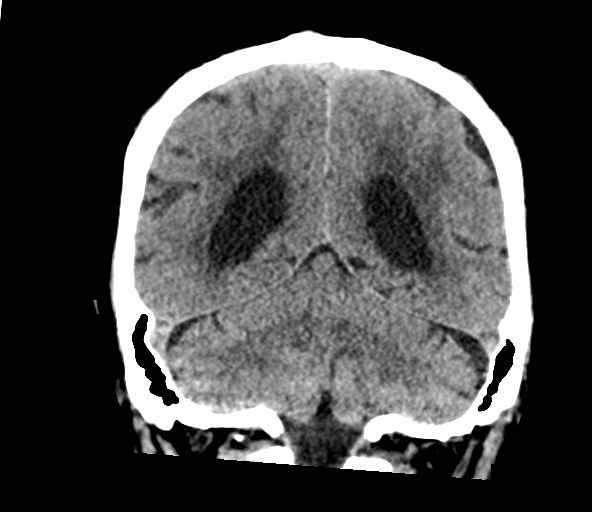
[im 34/77  brain]
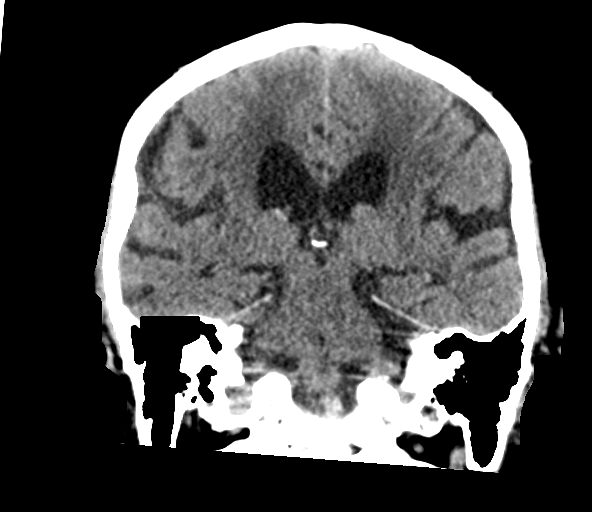
[im 43/77  brain]
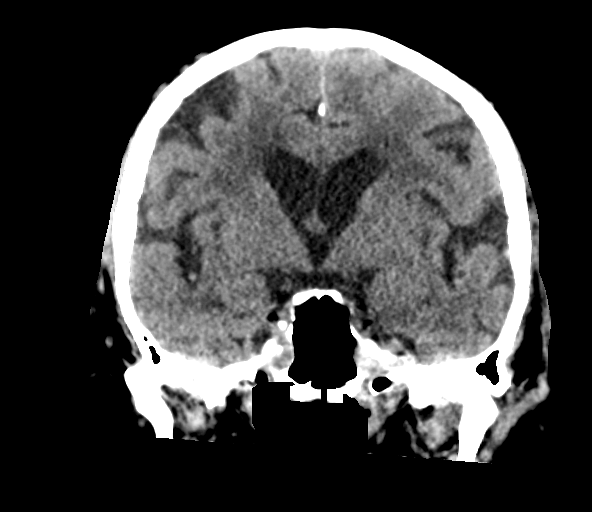

[Series 6: head without sag · sagittal · non-contrast · 0.36mm/px · 3 of 67 slices shown]
[im 24/67  brain]
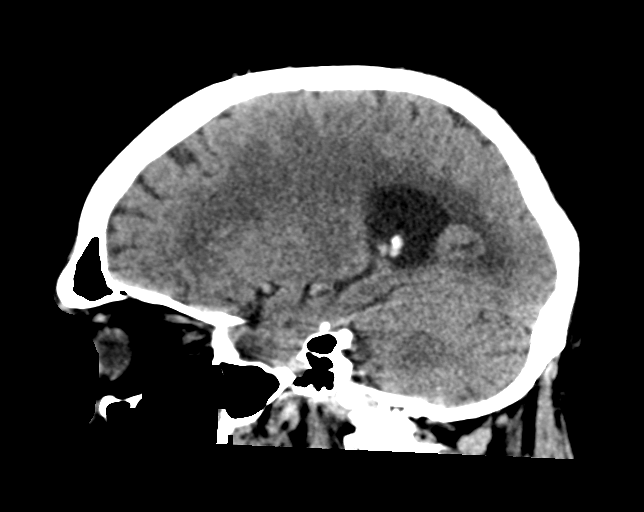
[im 34/67  brain]
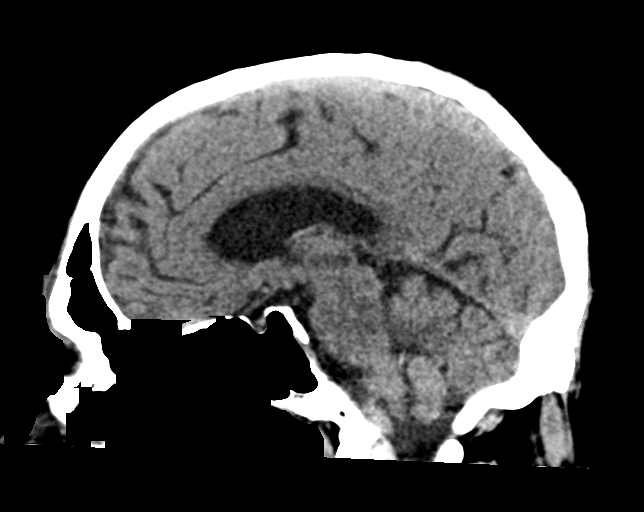
[im 44/67  brain]
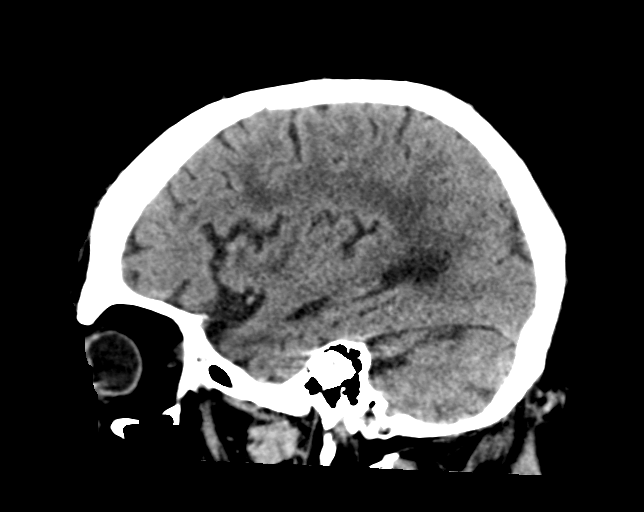

[16 of 47 positions shown; findings below may reference images not displayed]

FINDINGS: Brain: No midline shift, ventriculomegaly, mass effect, evidence of
mass lesion, intracranial hemorrhage or evidence of cortically based
acute infarction. Moderate bilateral Patchy and confluent cerebral
white matter hypodensity. Mild to moderate heterogeneity in the
bilateral deep gray nuclei. No cortical encephalomalacia identified.

Vascular: Calcified atherosclerosis at the skull base. No suspicious
intracranial vascular hyperdensity.

Skull: No acute osseous abnormality identified.

Sinuses/Orbits: Visualized paranasal sinuses and mastoids are clear.

Other: Left forehead scalp hematoma and possible laceration. Soft
tissue thickening up to 7 mm overlying the left frontal sinus, but
the left frontal bone appears to remain intact. No other acute orbit
or scalp soft tissue injury identified.
IMPRESSION: 1. Left forehead scalp soft tissue injury with no underlying skull
fracture.

2. No acute intracranial abnormality. Moderate for age cerebral
white matter and deep gray matter changes, most commonly due to
chronic small vessel disease.

## 2021-06-24 ENCOUNTER — Telehealth: Payer: Self-pay

## 2021-06-24 DIAGNOSIS — M7989 Other specified soft tissue disorders: Secondary | ICD-10-CM

## 2021-06-24 MED ORDER — SIMVASTATIN 20 MG PO TABS: 20.0000 mg | ORAL_TABLET | Freq: Every day | ORAL | 1 refills | Status: DC

## 2021-06-24 NOTE — Telephone Encounter (Signed)
Pharmacy faxed a refill request for: ?potassium chloride SA (KLOR-CON M20) 20 MEQ tablet [[id number]]  ?  Order Details ?Dose: 20 mEq Route: Oral Frequency: Daily  ?Dispense Quantity: 90 tablet Refills: 1   ?     ?Sig: Take 1 tablet (20 mEq total) by mouth daily.  ?     ?Start Date: 12/23/20 End Date: --  ?Written Date: 12/23/20 Expiration Date: 12/23/21  ? ? ?

## 2021-06-24 NOTE — Telephone Encounter (Signed)
Pharmacy faxed a refill request for: ? ?simvastatin (ZOCOR) 20 MG tablet [[id number]]  ?  Order Details ?Dose: 20 mg Route: Oral Frequency: Daily at bedtime  ?Dispense Quantity: 90 tablet Refills: 1   ?     ?Sig: Take 1 tablet (20 mg total) by mouth at bedtime.  ?     ?Start Date: 12/23/20 End Date: --  ?Written Date: 12/23/20 Expiration Date: 12/23/21  ? ?

## 2021-06-25 MED ORDER — POTASSIUM CHLORIDE CRYS ER 20 MEQ PO TBCR: 20.0000 meq | EXTENDED_RELEASE_TABLET | Freq: Every day | ORAL | 1 refills | Status: DC

## 2021-06-25 NOTE — Telephone Encounter (Signed)
Rx sent to pharmacy   

## 2021-07-06 ENCOUNTER — Telehealth: Payer: Self-pay | Admitting: Pharmacist

## 2021-07-06 NOTE — Progress Notes (Signed)
? ? ?Chronic Care Management ?Pharmacy Assistant  ? ?Name: Scott Ward  MRN: [id number] DOB: 1940-10-07 ? ?Reason for Encounter: Chart Review For Initial Visit With Clinical Pharmacist ?  ?Conditions to be addressed/monitored: ?HTN, COPD, DM ? ?Primary concerns for visit include: ?HTN, COPD, DM  ? ?Recent office visits:  ?05/12/2021 OV (PCP) Susy Frizzle, MD;  I will add tramadol 50 mg every 8 hours as needed for nerve pain in his legs.  If this does not help we can try gabapentin.  ? ?Recent consult visits:  ?02/04/2021 OV (Orthopedics) Magnus Sinning, MD; no medication changes indicated. ? ?Hospital visits:  ?None in previous 6 months ? ?Medications: ?Outpatient Encounter Medications as of 07/06/2021  ?Medication Sig  ? albuterol (VENTOLIN HFA) 108 (90 Base) MCG/ACT inhaler Inhale 2 puffs into the lungs every 6 (six) hours as needed for wheezing or shortness of breath.  ? aspirin 81 MG tablet Take 81 mg by mouth daily.    ? finasteride (PROSCAR) 5 MG tablet Take 1 tablet (5 mg total) by mouth daily.  ? Fluticasone-Umeclidin-Vilant (TRELEGY ELLIPTA) 100-62.5-25 MCG/ACT AEPB Inhale 1 puff into the lungs daily.  ? furosemide (LASIX) 40 MG tablet TAKE ONE TABLET BY MOUTH DAILY AS NEEDED FOR EDEMA  ? lisinopril (ZESTRIL) 40 MG tablet Take 1 tablet (40 mg total) by mouth daily.  ? metFORMIN (GLUCOPHAGE) 500 MG tablet Take 1 tablet (500 mg total) by mouth 2 (two) times daily with a meal.  ? potassium chloride SA (KLOR-CON M20) 20 MEQ tablet Take 1 tablet (20 mEq total) by mouth daily.  ? simvastatin (ZOCOR) 20 MG tablet Take 1 tablet (20 mg total) by mouth at bedtime.  ? ?No facility-administered encounter medications on file as of 07/06/2021.  ? ?Current Medications: ?Finasteride 5 mg last filled 05/01/2021 90 DS ?Metformin 500 mg last filled 04/17/2021 90 DS ?Furosemide 40 mg last filled 05/02/2021 90 DS ?Simvastatin 20 mg last filled 03/25/2021 90 DS ?Potassium Chloride 20 meq last filled 03/25/2021 90  DS ?Lisinopril 40 mg last filled 04/09/2021 90 DS ?Ventolin HFA 108 mcg last filled 05/05/2020 25 DS ?Trelegy Ellipta 100-62.5-25 mcg/Inh last filled 06/02/2021 30  ?Tramadol 50 mg last filled 06/09/2021 7 DS ? ?Patient Questions: ?Any changes in your medications or health? ?Patient's son denies the patient having any changes in his medications or health. ? ?Any side effects from any medications?  ?Patient's son denies the patient having any side effects from any of his medications. ? ?Do you have any symptoms or problems not managed by your medications? ?Patient's son denies the patient having any symptoms or problems that are not currently managed by his medications. ? ?Any concerns about your health right now? ?Patient's son denies the patient having any concerns with his health right now. ? ?Has your provider asked that you check blood pressure, blood sugar, or follow special diet at home? ?Patient's son states he checks the patients blood pressure and blood sugar at home. He does not follow any special diet. ? ?Do you get any type of exercise on a regular basis? ?Patient is able to "get around outside". ? ?Can you think of a goal you would like to reach for your health? ?Patient's son states they do not currently have a goal they would like to reach. ? ?Do you have any problems getting your medications? ?Patient's son denies having any issues or problems getting his medications. ? ?Is there anything that you would like to discuss during the appointment?  ?"Not  that I know of." ? ?Please bring medications and supplements to appointment ? ?Care Gaps: ?Medicare Annual Wellness: Last AWV 11/08/2019 ?Ophthalmology Exam: Overdue since 12/02/2020 ?Foot Exam: Overdue since 04/29/2020 ?Hemoglobin A1C: 6.6% on 05/12/2021 ?Colonoscopy: Completed 04/14/2004 ? ?Future Appointments  ?Date Time Provider [place]  ?07/09/2021 11:00 AM BSFM-CCM PHARMACIST [facility]  ?11/12/2021  8:00 AM Susy Frizzle, MD [facility]   ?12/02/2021  8:00 AM Rankin, Clent Demark, MD RDE-RDE None  ?02/25/2022 10:30 AM BSFM-NURSE HEALTH ADVISOR [facility]  ? ?Star Rating Drugs: ?Metformin 500 mg last filled 04/17/2021 90 DS ?Simvastatin 20 mg last filled 03/25/2021 90 DS ?Lisinopril 40 mg last filled 04/09/2021 90 DS ? ?April D Calhoun, [place] ?Clinical Pharmacist Assistant ?[phone] ?

## 2021-07-09 ENCOUNTER — Ambulatory Visit: Payer: Medicare HMO | Admitting: Pharmacist

## 2021-07-09 DIAGNOSIS — I10 Essential (primary) hypertension: Secondary | ICD-10-CM

## 2021-07-09 DIAGNOSIS — E119 Type 2 diabetes mellitus without complications: Secondary | ICD-10-CM

## 2021-07-09 NOTE — Progress Notes (Signed)
? ?Chronic Care Management ?Pharmacy Note ? ?07/09/2021 ?Name:  Scott Ward MRN:  [id number] DOB:  12/15/1940 ? ?Summary: ?Initial visit with PharmD.  GFR is borderline with metformin.  Continue to monitor GFR and d/c if drops below 30.  Possible concern with copay on Trelegy.  Discussed various programs and option of switching to [place]. ? ?Recommendations/Changes made from today's visit: ?Monitor GFR - metformin contraindicated if < 30 ?Due for diabetic foot exam ? ?Plan: ?Fu 1 year ? ? ?Subjective: ?Scott Ward is an 81 y.o. year old male who is a primary patient of [name], [name], MD.  The CCM team was consulted for assistance with disease management and care coordination needs.   ? ?Engaged with patient by telephone for initial visit in response to provider referral for pharmacy case management and/or care coordination services.  ? ?Consent to Services:  ?The patient was given the following information about Chronic Care Management services today, agreed to services, and gave verbal consent: 1. CCM service includes personalized support from designated clinical staff supervised by the primary care provider, including individualized plan of care and coordination with other care providers 2. 24/7 contact phone numbers for assistance for urgent and routine care needs. 3. Service will only be billed when office clinical staff spend 20 minutes or more in a month to coordinate care. 4. Only one practitioner may furnish and bill the service in a calendar month. 5.The patient may stop CCM services at any time (effective at the end of the month) by phone call to the office staff. 6. The patient will be responsible for cost sharing (co-pay) of up to 20% of the service fee (after annual deductible is met). Patient agreed to services and consent obtained. ? ?Patient Care Team: ?Susy Frizzle, MD as PCP - General (Family Medicine) ?Edythe Clarity, [facility] as Pharmacist (Pharmacist) ? ?Recent office visits:   ?05/12/2021 OV (PCP) Susy Frizzle, MD;  I will add tramadol 50 mg every 8 hours as needed for nerve pain in his legs.  If this does not help we can try gabapentin.  ?  ?Recent consult visits:  ?02/04/2021 OV (Orthopedics) Magnus Sinning, MD; no medication changes indicated. ?  ?Hospital visits:  ?None in previous 6 months ? ?Objective: ? ?Lab Results  ?Component Value Date  ? CREATININE 2.07 (H) 05/12/2021  ? BUN 35 (H) 05/12/2021  ? EGFR 31 (L) 05/12/2021  ? GFRNONAA 30 (L) 05/05/2020  ? GFRAA 34 (L) 05/05/2020  ? NA 143 05/12/2021  ? K 5.6 (H) 05/12/2021  ? CALCIUM 10.0 05/12/2021  ? CO2 23 05/12/2021  ? GLUCOSE 100 (H) 05/12/2021  ? ? ?Lab Results  ?Component Value Date/Time  ? HGBA1C 6.6 (H) 05/12/2021 08:45 AM  ? HGBA1C 6.5 (H) 11/06/2020 08:30 AM  ? MICROALBUR 1.4 05/12/2021 08:15 AM  ? MICROALBUR 2.7 05/05/2020 08:37 AM  ?  ?Last diabetic Eye exam:  ?Lab Results  ?Component Value Date/Time  ? HMDIABEYEEXA No Retinopathy 12/06/2017 12:00 AM  ?  ?Last diabetic Foot exam: No results found for: HMDIABFOOTEX  ? ?Lab Results  ?Component Value Date  ? CHOL 152 05/12/2021  ? HDL 39 (L) 05/12/2021  ? [place] 81 05/12/2021  ? TRIG 233 (H) 05/12/2021  ? CHOLHDL 3.9 05/12/2021  ? ? ? ?  Latest Ref Rng & Units 05/12/2021  ?  8:45 AM 11/06/2020  ?  8:30 AM 05/05/2020  ?  8:37 AM  ?Hepatic Function  ?Total Protein 6.1 - 8.1  g/dL 6.7   6.7   6.5    ?AST 10 - 35 U/L $Remo'13   12   14    'guppl$ ?ALT 9 - 46 U/L $Remo'11   9   13    'vsSmU$ ?Total Bilirubin 0.2 - 1.2 mg/dL 0.2   0.3   0.3    ? ? ?Lab Results  ?Component Value Date/Time  ? TSH 0.180 (L) 03/03/2016 02:23 AM  ? ? ? ?  Latest Ref Rng & Units 05/12/2021  ?  8:45 AM 11/06/2020  ?  8:30 AM 05/05/2020  ?  8:37 AM  ?CBC  ?WBC 3.8 - 10.8 Thousand/uL 10.1   8.9   8.7    ?Hemoglobin 13.2 - 17.1 g/dL 13.5   13.8   13.2    ?Hematocrit 38.5 - 50.0 % 40.4   41.5   39.6    ?Platelets 140 - 400 Thousand/uL 229   246   224    ? ? ?No results found for: VD25OH ? ?Clinical ASCVD: No  ?The ASCVD Risk score  (Arnett DK, et al., 2019) failed to calculate for the following reasons: ?  The 2019 ASCVD risk score is only valid for ages 8 to 80   ? ? ?  02/19/2021  ? 10:39 AM 11/08/2019  ?  8:52 AM 10/30/2018  ?  8:18 AM  ?Depression screen PHQ 2/9  ?Decreased Interest 0 0 0  ?Down, Depressed, Hopeless 0 0 0  ?PHQ - 2 Score 0 0 0  ?  ? ? ?Social History  ? ?Tobacco Use  ?Smoking Status Every Day  ? Packs/day: 1.00  ? Years: 60.00  ? Pack years: 60.00  ? Types: Cigarettes  ?Smokeless Tobacco Never  ? ?BP Readings from Last 3 Encounters:  ?05/12/21 (!) 142/88  ?02/19/21 (!) 161/93  ?02/04/21 (!) 168/93  ? ?Pulse Readings from Last 3 Encounters:  ?05/12/21 84  ?02/19/21 98  ?02/04/21 91  ? ?Wt Readings from Last 3 Encounters:  ?05/12/21 203 lb (92.1 kg)  ?02/19/21 208 lb (94.3 kg)  ?11/06/20 208 lb (94.3 kg)  ? ?BMI Readings from Last 3 Encounters:  ?05/12/21 28.31 kg/m?  ?02/19/21 29.01 kg/m?  ?11/06/20 29.01 kg/m?  ? ? ?Assessment/Interventions: Review of patient past medical history, allergies, medications, health status, including review of consultants reports, laboratory and other test data, was performed as part of comprehensive evaluation and provision of chronic care management services.  ? ?SDOH:  (Social Determinants of Health) assessments and interventions performed: Yes ? ?Financial Resource Strain: Low Risk   ? Difficulty of Paying Living Expenses: Not hard at all  ? ?Food Insecurity: No Food Insecurity  ? Worried About Charity fundraiser in the Last Year: Never true  ? Ran Out of Food in the Last Year: Never true  ? ? ?SDOH Screenings  ? ?Alcohol Screen: Low Risk   ? Last Alcohol Screening Score (AUDIT): 0  ?Depression (PHQ2-9): Low Risk   ? PHQ-2 Score: 0  ?Financial Resource Strain: Low Risk   ? Difficulty of Paying Living Expenses: Not hard at all  ?Food Insecurity: No Food Insecurity  ? Worried About Charity fundraiser in the Last Year: Never true  ? Ran Out of Food in the Last Year: Never true  ?Housing: Low  Risk   ? Last Housing Risk Score: 0  ?Physical Activity: Inactive  ? Days of Exercise per Week: 0 days  ? Minutes of Exercise per Session: 0 min  ?Social Connections: Socially  Isolated  ? Frequency of Communication with Friends and Family: More than three times a week  ? Frequency of Social Gatherings with Friends and Family: More than three times a week  ? Attends Religious Services: Never  ? Active Member of Clubs or Organizations: No  ? Attends Archivist Meetings: Never  ? Marital Status: Divorced  ?Stress: No Stress Concern Present  ? Feeling of Stress : Not at all  ?Tobacco Use: High Risk  ? Smoking Tobacco Use: Every Day  ? Smokeless Tobacco Use: Never  ? Passive Exposure: Not on file  ?Transportation Needs: No Transportation Needs  ? Lack of Transportation (Medical): No  ? Lack of Transportation (Non-Medical): No  ? ? ?CCM Care Plan ? ?No Known Allergies ? ?Medications Reviewed Today   ? ? Reviewed by Edythe Clarity, RPH (Pharmacist) on 07/09/21 at 1157  Med List Status: <None>  ? ?Medication Order Taking? Sig Documenting Provider Last Dose Status Informant  ?albuterol (VENTOLIN HFA) 108 (90 Base) MCG/ACT inhaler [id number] Yes Inhale 2 puffs into the lungs every 6 (six) hours as needed for wheezing or shortness of breath. Susy Frizzle, MD Taking Active   ?aspirin 81 MG tablet [id number] Yes Take 81 mg by mouth daily.   [provider] Taking Active Family Member  ?         ?Med Note Jannifer Franklin, SANDY B   Mon Apr 30, 2019  8:11 AM)    ?finasteride (PROSCAR) 5 MG tablet [id number] Yes Take 1 tablet (5 mg total) by mouth daily. Susy Frizzle, MD Taking Active   ?Fluticasone-Umeclidin-Vilant (TRELEGY ELLIPTA) 100-62.5-25 MCG/ACT AEPB [id number] Yes Inhale 1 puff into the lungs daily. Susy Frizzle, MD Taking Active   ?furosemide (LASIX) 40 MG tablet [id number] Yes TAKE ONE TABLET BY MOUTH DAILY AS NEEDED FOR EDEMA Susy Frizzle, MD Taking Active   ?lisinopril (ZESTRIL) 40 MG tablet  [id number] Yes Take 1 tablet (40 mg total) by mouth daily. Susy Frizzle, MD Taking Active   ?metFORMIN (GLUCOPHAGE) 500 MG tablet [id number] Yes Take 1 tablet (500 mg total) by mouth 2 (two) times daily with a me

## 2021-07-09 NOTE — Patient Instructions (Addendum)
Visit Information ? ? Goals Addressed   ? ?  ?  ?  ?  ? This Visit's Progress  ?  Track and Manage My Blood Pressure-Hypertension     ?  Timeframe:  Long-Range Goal ?Priority:  High ?Start Date:  07/09/21                           ?Expected End Date: 01/09/22                     ? ?Follow Up Date 08//11/23  ?  ?- check blood pressure weekly ?- choose a place to take my blood pressure (home, clinic or office, retail store)  ?  ?Why is this important?   ?You won't feel high blood pressure, but it can still hurt your blood vessels.  ?High blood pressure can cause heart or kidney problems. It can also cause a stroke.  ?Making lifestyle changes like losing a little weight or eating less salt will help.  ?Checking your blood pressure at home and at different times of the day can help to control blood pressure.  ?If the doctor prescribes medicine remember to take it the way the doctor ordered.  ?Call the office if you cannot afford the medicine or if there are questions about it.   ?  ?Notes:  ?  ? ?  ? ?Patient Care Plan: General Pharmacy (Adult)  ?  ? ?Problem Identified: Hypertension, Diabetes, and COPD   ?Priority: High  ?Onset Date: 07/09/2021  ?  ? ?Long-Range Goal: Patient-Specific Goal   ?Start Date: 07/09/2021  ?Expected End Date: 01/09/2022  ?This Visit's Progress: On track  ?Priority: High  ?Note:   ?Current Barriers:  ?GFR borderline with metformin ? ?Pharmacist Clinical Goal(s):  ?Patient will adhere to plan to optimize therapeutic regimen for DM as evidenced by report of adherence to recommended medication management changes through collaboration with PharmD and provider.  ? ?Interventions: ?1:1 collaboration with Susy Frizzle, MD regarding development and update of comprehensive plan of care as evidenced by provider attestation and co-signature ?Inter-disciplinary care team collaboration (see longitudinal plan of care) ?Comprehensive medication review performed; medication list updated in electronic  medical record ? ?Hypertension (BP goal <140/90) ?-Controlled ?-Current treatment: ?Lisinopril '40mg'$  Appropriate, Effective, Safe, Accessible ?-Medications previously tried: verapamil  ?-Current home readings: 140s/80s ?-Current exercise habits: not much organized exercise, can walk to the mailbox and back ?-Denies hypotensive/hypertensive symptoms ?-Educated on BP goals and benefits of medications for prevention of heart attack, stroke and kidney damage; ?Daily salt intake goal < 2300 mg; ?Symptoms of hypotension and importance of maintaining adequate hydration; ?-Counseled to monitor BP at home a few times per week, document, and provide log at future appointments ?-Recommended to continue current medication ? ?Diabetes (A1c goal <7%) ?-Controlled ?-Current medications: ?Metformin '500mg'$  twice daily with meals Appropriate, Effective, Query Safe,  ?-Medications previously tried: Januvia  ?-Current home glucose readings ?fasting glucose: 128 last time he checked, all usually under 150 ?post prandial glucose:  ?-Denies hypoglycemic/hyperglycemic symptoms ?-Educated on A1c and blood sugar goals; ?Benefits of routine self-monitoring of blood sugar; ?-Counseled to check feet daily and get yearly eye exams ?-Recommended to continue current medication, need to closely monitor GFR and metformin.  Cautioned on signs of lactic acidosis.  He is coming every 6 months for labs, if GFR drops below 30 then he would need to d/c metformin. ? ?COPD (Goal: control symptoms and prevent exacerbations) ?-Controlled ?-  Current treatment  ?Trelegy 100-62.5-25 Appropriate, Effective, Safe, Accessible ?Albuterol HFA 57mg Appropriate, Effective, Safe, Accessible ?-Medications previously tried: none noted  ?-MMRC/CAT score:  ?   ? View : No data to display.  ?  ?  ?  ?-Pulmonary function testing: Pulmonary Functions Testing Results: ? ?No results found for: FEV1, FVC, FEV1FVC, TLC, DLCO ? ?-Exacerbations requiring treatment in last 6 months:  none noted ?-Patient reports consistent use of maintenance inhaler ?-Frequency of rescue inhaler use: rarely ?-Counseled on [place] inhaler technique; ?Benefits of consistent maintenance inhaler use ?When to use rescue inhaler ?Differences between maintenance and rescue inhalers ?-Recommended to continue current medication ?Assessed patient finances. Trelegy does go up during the donut hole.  Hesitant to switch since it is working so well.  Did discuss the possibility of Breztri due to ease of application.  Patient is aware and plans to discuss with Dr. PDennard Schaumannat next office visit. ? ? ? ?Patient Goals/Self-Care Activities ?Patient will:  ?- take medications as prescribed as evidenced by patient report and record review ?check glucose daily, document, and provide at future appointments ?check blood pressure daily, document, and provide at future appointments ? ?Follow Up Plan: The care management team will reach out to the patient again over the next 365 days.  ? ?  ? ? ?Mr. Alberson was given information about Chronic Care Management services today including:  ?CCM service includes personalized support from designated clinical staff supervised by his physician, including individualized plan of care and coordination with other care providers ?24/7 contact phone numbers for assistance for urgent and routine care needs. ?Standard insurance, coinsurance, copays and deductibles apply for chronic care management only during months in which we provide at least 20 minutes of these services. Most insurances cover these services at 100%, however patients may be responsible for any copay, coinsurance and/or deductible if applicable. This service may help you avoid the need for more expensive face-to-face services. ?Only one practitioner may furnish and bill the service in a calendar month. ?The patient may stop CCM services at any time (effective at the end of the month) by phone call to the office staff. ? ?Patient agreed to  services and verbal consent obtained.  ? ?The patient verbalized understanding of instructions, educational materials, and care plan provided today and agreed to receive a mailed copy of patient instructions, educational materials, and care plan.  ?Telephone follow up appointment with pharmacy team member scheduled for: 1 year ? ?CEdythe Clarity R[facility] ?CBeverly Milch PharmD, CPP ?Clinical Pharmacist Practitioner ?[place]?[phone] ?

## 2021-08-20 ENCOUNTER — Other Ambulatory Visit: Payer: Self-pay | Admitting: Family Medicine

## 2021-08-20 NOTE — Telephone Encounter (Signed)
Requested Prescriptions  Pending Prescriptions Disp Refills  . furosemide (LASIX) 40 MG tablet [Pharmacy Med Name: FUROSEMIDE 40 MG TABLET] 90 tablet 1    Sig: TAKE ONE TABLET BY MOUTH DAILY AS NEEDED FOR EDEMA     Cardiovascular:  Diuretics - Loop Failed - 08/20/2021  3:45 PM      Failed - K in normal range and within 180 days    Potassium  Date Value Ref Range Status  05/12/2021 5.6 (H) 3.5 - 5.3 mmol/L Final         Failed - Cr in normal range and within 180 days    Creat  Date Value Ref Range Status  05/12/2021 2.07 (H) 0.70 - 1.22 mg/dL Final         Failed - Mg Level in normal range and within 180 days    No results found for: "MG"       Failed - Last BP in normal range    BP Readings from Last 1 Encounters:  05/12/21 (!) 142/88         Passed - Ca in normal range and within 180 days    Calcium  Date Value Ref Range Status  05/12/2021 10.0 8.6 - 10.3 mg/dL Final         Passed - Na in normal range and within 180 days    Sodium  Date Value Ref Range Status  05/12/2021 143 135 - 146 mmol/L Final         Passed - Cl in normal range and within 180 days    Chloride  Date Value Ref Range Status  05/12/2021 109 98 - 110 mmol/L Final         Passed - Valid encounter within last 6 months    Recent Outpatient Visits          3 months ago Controlled type 2 diabetes mellitus without complication, without long-term current use of insulin ([place])   [place] [name], [name], MD   9 months ago Controlled type 2 diabetes mellitus without complication, without long-term current use of insulin ([place])   [place] Susy Frizzle, MD   1 year ago Controlled type 2 diabetes mellitus without complication, without long-term current use of insulin ([place])   [place] [name], [name], MD   1 year ago Controlled type 2 diabetes mellitus without complication, without long-term current use of insulin ([place])   [place] [name], [name], MD   1 year ago Benign essential HTN   [place], [name], MD      Future Appointments            In 2 months [name], [name], MD [place], [facility]

## 2021-08-21 ENCOUNTER — Other Ambulatory Visit: Payer: Self-pay

## 2021-08-28 ENCOUNTER — Encounter: Payer: Self-pay | Admitting: Family Medicine

## 2021-08-28 ENCOUNTER — Other Ambulatory Visit: Payer: Self-pay | Admitting: Family Medicine

## 2021-08-28 MED ORDER — TRAMADOL HCL 50 MG PO TABS: 50.0000 mg | ORAL_TABLET | Freq: Three times a day (TID) | ORAL | 0 refills | Status: AC | PRN

## 2021-10-05 ENCOUNTER — Other Ambulatory Visit: Payer: Self-pay | Admitting: Family Medicine

## 2021-11-12 ENCOUNTER — Ambulatory Visit (INDEPENDENT_AMBULATORY_CARE_PROVIDER_SITE_OTHER): Payer: Medicare HMO | Admitting: Family Medicine

## 2021-11-12 VITALS — BP 132/80 | HR 88 | Temp 98.4°F | Wt 202.4 lb

## 2021-11-12 DIAGNOSIS — N1832 Chronic kidney disease, stage 3b: Secondary | ICD-10-CM | POA: Diagnosis not present

## 2021-11-12 DIAGNOSIS — J438 Other emphysema: Secondary | ICD-10-CM | POA: Diagnosis not present

## 2021-11-12 DIAGNOSIS — I10 Essential (primary) hypertension: Secondary | ICD-10-CM | POA: Diagnosis not present

## 2021-11-12 DIAGNOSIS — E119 Type 2 diabetes mellitus without complications: Secondary | ICD-10-CM

## 2021-11-12 DIAGNOSIS — H353131 Nonexudative age-related macular degeneration, bilateral, early dry stage: Secondary | ICD-10-CM | POA: Diagnosis not present

## 2021-11-12 DIAGNOSIS — E1122 Type 2 diabetes mellitus with diabetic chronic kidney disease: Secondary | ICD-10-CM | POA: Diagnosis not present

## 2021-11-12 DIAGNOSIS — E7800 Pure hypercholesterolemia, unspecified: Secondary | ICD-10-CM

## 2021-11-12 NOTE — Progress Notes (Signed)
Subjective:    Patient ID: Scott Ward, male    DOB: 1940/07/27, 81 y.o.   MRN: [id number]  3/23 Patient continues to suffer with bilateral leg pain.  He describes aching throbbing pain in his hips and thighs and lower legs.  We performed arterial studies that showed no evidence of peripheral vascular disease in the past.  An MRI did show however degenerative disc disease particularly at L4-L5 which could potentially be pinching nerves in his back.  He has tried epidural steroid injections with no improvement in the leg pain.  He is currently taking Tylenol.  His blood pressures consistently between 130 and 145 which is good for his age.  Unfortunately continues to smoke and has no desire to quit however he denies any cough or chest pain or pleurisy.  He is no longer wheezing on his exam.  The Trelegy seems to be working well.  His son does an excellent job of checking his blood sugars and his blood sugars are typically below 120.  He denies any hypoglycemic episodes.  He is having to use a half a fluid pill a day to control the swelling in his legs.  Today there is no peripheral edema.  At that time, my plan was:  Blood pressure is acceptable for age.  Continue to encourage smoking cessation but the patient is in the precontemplative phase.  We will continue Trelegy for his emphysema.  Check a CBC, CMP, lipid panel.  Goal LDL cholesterol is less than 100.  Check a hemoglobin A1c.  Goal A1c is less than 7.  I will add tramadol 50 mg every 8 hours as needed for nerve pain in his legs.  If this does not help we can try gabapentin.  I encouraged him to continue to take Tylenol 1-2 times a day on a scheduled basis to try to help with the leg pain.  Son will notify me in a few weeks to let me know if tramadol is helping.  He appears euvolemic today on exam with no evidence of heart failure.  He denies any angina.  11/12/21 Patient is a very pleasant 81 year old Caucasian gentleman who presents today for a  follow-up.  He is currently on Trelegy for emphysema.  He is now in the donut hole so this is very expensive for him.  He is questioning if there is anything cheaper.  We discussed stepping down in therapy to the generic version of Advair to see if that will save him some money.  At the present time, his son would like to continue the Trelegy but they can discuss this and let me know what they decide.  He is also on metformin.  His most recent creatinine was 2.  His GFR is in the 30s.  Therefore metformin is no longer recommended.  He has not had any symptoms of lactic acidosis.  Specifically he denies any nausea or vomiting or abdominal pain or diarrhea.  His generic options would be glipizide or Actos.  He does have a history of congestive heart failure and takes Lasix for swelling.  Therefore we would like to avoid Actos.  I am concerned about hypoglycemia with glipizide.  Cost is already an issue for the patient's of stepping up to a namebrand Januvia or Wilder Glade would be an issue for him.  We discussed this at length today.  He continues to have pain in his legs.  He takes about 8 pain pills a month.  The majority the time  he is able to manage the neuropathic pain with Tylenol.  He is due for his flu shot for his COVID-vaccine. Past Medical History:  Diagnosis Date   Arthritis    "lower back" (03/02/2016)   CAP (community acquired pneumonia) 2018   Right Middle Lobe   Colon polyp    COPD (chronic obstructive pulmonary disease) ([place]) dx'd 03/02/2016   Dermatochalasis    Diabetes mellitus without complication ([place])    Fatty liver    High blood pressure    High cholesterol    History of BPH    History of colon polyps    HOH (hard of hearing)    Nicotine abuse    Right carotid bruit    Past Surgical History:  Procedure Laterality Date   BLEPHAROPLASTY Bilateral    CATARACT EXTRACTION W/ INTRAOCULAR LENS  IMPLANT, BILATERAL     COLONOSCOPY     TRANSURETHRAL RESECTION OF PROSTATE N/A 02/06/2019    Procedure: TRANSURETHRAL RESECTION OF THE PROSTATE (TURP);  Surgeon: Irine Seal, MD;  Location: [facility];  Service: Urology;  Laterality: N/A;   Current Outpatient Medications on File Prior to Visit  Medication Sig Dispense Refill   albuterol (VENTOLIN HFA) 108 (90 Base) MCG/ACT inhaler Inhale 2 puffs into the lungs every 6 (six) hours as needed for wheezing or shortness of breath. 18 g 1   aspirin 81 MG tablet Take 81 mg by mouth daily.       finasteride (PROSCAR) 5 MG tablet Take 1 tablet (5 mg total) by mouth daily. 90 tablet 3   Fluticasone-Umeclidin-Vilant (TRELEGY ELLIPTA) 100-62.5-25 MCG/ACT AEPB Inhale 1 puff into the lungs daily. 1 each 11   furosemide (LASIX) 40 MG tablet TAKE ONE TABLET BY MOUTH DAILY AS NEEDED FOR EDEMA 90 tablet 0   lisinopril (ZESTRIL) 40 MG tablet TAKE ONE TABLET BY MOUTH DAILY 90 tablet 1   metFORMIN (GLUCOPHAGE) 500 MG tablet Take 1 tablet (500 mg total) by mouth 2 (two) times daily with a meal. 180 tablet 3   potassium chloride SA (KLOR-CON M20) 20 MEQ tablet Take 1 tablet (20 mEq total) by mouth daily. 90 tablet 1   simvastatin (ZOCOR) 20 MG tablet Take 1 tablet (20 mg total) by mouth at bedtime. 90 tablet 1   No current facility-administered medications on file prior to visit.   No Known Allergies  Social History   Socioeconomic History   Marital status: Divorced    Spouse name: Not on file   Number of children: 1   Years of education: 8   Highest education level: Not on file  Occupational History   Occupation: RETIRED  Tobacco Use   Smoking status: Every Day    Packs/day: 1.00    Years: 60.00    Total pack years: 60.00    Types: Cigarettes   Smokeless tobacco: Never  Vaping Use   Vaping Use: Never used  Substance and Sexual Activity   Alcohol use: Not Currently    Comment: 03/02/2016 "nothing since 1973"   Drug use: No   Sexual activity: Never  Other Topics Concern   Not on file  Social History Narrative   Patient is single with one  child.   Patient is left handed.   Patient has an 8 th grade education.   Patient drinks up to 3 cups daily.   Social Determinants of Health   Financial Resource Strain: Low Risk  (02/19/2021)   Overall Financial Resource Strain (CARDIA)    Difficulty of Paying Living  Expenses: Not hard at all  Food Insecurity: No Food Insecurity (02/19/2021)   Hunger Vital Sign    Worried About Running Out of Food in the Last Year: Never true    Ran Out of Food in the Last Year: Never true  Transportation Needs: No Transportation Needs (02/19/2021)   PRAPARE - Hydrologist (Medical): No    Lack of Transportation (Non-Medical): No  Physical Activity: Inactive (02/19/2021)   Exercise Vital Sign    Days of Exercise per Week: 0 days    Minutes of Exercise per Session: 0 min  Stress: No Stress Concern Present (02/19/2021)   [place]    Feeling of Stress : Not at all  Social Connections: Socially Isolated (02/19/2021)   Social Connection and Isolation Panel [NHANES]    Frequency of Communication with Friends and Family: More than three times a week    Frequency of Social Gatherings with Friends and Family: More than three times a week    Attends Religious Services: Never    Marine scientist or Organizations: No    Attends Archivist Meetings: Never    Marital Status: Divorced  Human resources officer Violence: Not At Risk (02/19/2021)   Humiliation, Afraid, Rape, and Kick questionnaire    Fear of Current or Ex-Partner: No    Emotionally Abused: No    Physically Abused: No    Sexually Abused: No   Family History  Problem Relation Age of Onset   Heart attack Father    Heart attack Sister    Lung cancer Sister       Review of Systems  All other systems reviewed and are negative.      Objective:   Physical Exam Vitals reviewed.  Constitutional:      General: He is not in acute  distress.    Appearance: He is well-developed. He is not diaphoretic.  HENT:     Head: Normocephalic and atraumatic.     Right Ear: External ear normal.     Left Ear: External ear normal.     Nose: Nose normal.     Mouth/Throat:     Pharynx: No oropharyngeal exudate.  Eyes:     General: No scleral icterus.       Right eye: No discharge.        Left eye: No discharge.     Conjunctiva/sclera: Conjunctivae normal.     Pupils: Pupils are equal, round, and reactive to light.  Neck:     Vascular: No JVD.  Cardiovascular:     Rate and Rhythm: Normal rate and regular rhythm.     Heart sounds: Normal heart sounds. No murmur heard.    No friction rub. No gallop.  Pulmonary:     Effort: Pulmonary effort is normal. No respiratory distress.     Breath sounds: No stridor. No wheezing, rhonchi or rales.  Chest:     Chest wall: No tenderness.  Abdominal:     General: Bowel sounds are normal. There is no distension.     Palpations: Abdomen is soft. There is no mass.     Tenderness: There is no abdominal tenderness. There is no guarding or rebound.  Musculoskeletal:        General: No tenderness or deformity. Normal range of motion.     Cervical back: Normal range of motion and neck supple.     Right lower leg: No edema.  Left lower leg: No edema.  Lymphadenopathy:     Cervical: No cervical adenopathy.  Skin:    General: Skin is warm.     Coloration: Skin is not pale.     Findings: No erythema or rash.  Neurological:     Mental Status: He is alert and oriented to person, place, and time.     Cranial Nerves: No cranial nerve deficit.     Motor: No abnormal muscle tone.     Coordination: Coordination normal.     Deep Tendon Reflexes: Reflexes are normal and symmetric.  Psychiatric:        Behavior: Behavior normal.        Thought Content: Thought content normal.        Judgment: Judgment normal.    Patient does have wheezing in both lungs today       Assessment & Plan:   Stage 3b chronic kidney disease ([place]) - Plan: CBC with Differential/Platelet, Lipid panel, COMPLETE METABOLIC PANEL WITH GFR, Hemoglobin A1c  Controlled type 2 diabetes mellitus without complication, without long-term current use of insulin ([place]) - Plan: CBC with Differential/Platelet, Lipid panel, COMPLETE METABOLIC PANEL WITH GFR, Hemoglobin A1c  Other emphysema (HCC)  Pure hypercholesterolemia - Plan: CBC with Differential/Platelet, Lipid panel, COMPLETE METABOLIC PANEL WITH GFR, Hemoglobin A1c  Benign essential HTN - Plan: CBC with Differential/Platelet, Lipid panel, COMPLETE METABOLIC PANEL WITH GFR, Hemoglobin A1c I am concerned about the risk of lactic acidosis due to metformin with his chronic kidney disease.  Patient will be an excellent candidate for Jardiance or Januvia however cost is an issue.  He is having a difficult time affording the Trelegy.  We discussed switching from Trelegy to [facility] to try to save money.  At the present time his family would like to continue the Trelegy.  I did recommend a flu shot and a COVID shot.  Check CBC CMP lipid panel and A1c.  If his A1c is in the low 6 range, I may just hold metformin and watch his sugars.  The other option will be a very low-dose glipizide such as 2.5 mg daily.  Blood pressure today is outstanding

## 2021-11-13 ENCOUNTER — Other Ambulatory Visit: Payer: Self-pay

## 2021-11-13 LAB — CBC WITH DIFFERENTIAL/PLATELET
Absolute Monocytes: 1056 cells/uL — ABNORMAL HIGH (ref 200–950)
Basophils Absolute: 70 cells/uL (ref 0–200)
Basophils Relative: 0.6 %
Eosinophils Absolute: 302 cells/uL (ref 15–500)
Eosinophils Relative: 2.6 %
HCT: 40.9 % (ref 38.5–50.0)
Hemoglobin: 13.8 g/dL (ref 13.2–17.1)
Lymphs Abs: 1322 cells/uL (ref 850–3900)
MCH: 31.8 pg (ref 27.0–33.0)
MCHC: 33.7 g/dL (ref 32.0–36.0)
MCV: 94.2 fL (ref 80.0–100.0)
MPV: 11.3 fL (ref 7.5–12.5)
Monocytes Relative: 9.1 %
Neutro Abs: 8851 cells/uL — ABNORMAL HIGH (ref 1500–7800)
Neutrophils Relative %: 76.3 %
Platelets: 258 10*3/uL (ref 140–400)
RBC: 4.34 10*6/uL (ref 4.20–5.80)
RDW: 13.2 % (ref 11.0–15.0)
Total Lymphocyte: 11.4 %
WBC: 11.6 10*3/uL — ABNORMAL HIGH (ref 3.8–10.8)

## 2021-11-13 LAB — COMPLETE METABOLIC PANEL WITH GFR
AG Ratio: 1.4 (calc) (ref 1.0–2.5)
ALT: 6 U/L — ABNORMAL LOW (ref 9–46)
AST: 10 U/L (ref 10–35)
Albumin: 4.3 g/dL (ref 3.6–5.1)
Alkaline phosphatase (APISO): 92 U/L (ref 35–144)
BUN/Creatinine Ratio: 16 (calc) (ref 6–22)
BUN: 38 mg/dL — ABNORMAL HIGH (ref 7–25)
CO2: 24 mmol/L (ref 20–32)
Calcium: 9.8 mg/dL (ref 8.6–10.3)
Chloride: 109 mmol/L (ref 98–110)
Creat: 2.32 mg/dL — ABNORMAL HIGH (ref 0.70–1.22)
Globulin: 3 g/dL (calc) (ref 1.9–3.7)
Glucose, Bld: 161 mg/dL — ABNORMAL HIGH (ref 65–99)
Potassium: 5.4 mmol/L — ABNORMAL HIGH (ref 3.5–5.3)
Sodium: 143 mmol/L (ref 135–146)
Total Bilirubin: 0.2 mg/dL (ref 0.2–1.2)
Total Protein: 7.3 g/dL (ref 6.1–8.1)
eGFR: 27 mL/min/{1.73_m2} — ABNORMAL LOW (ref >=60)

## 2021-11-13 LAB — HEMOGLOBIN A1C
Hgb A1c MFr Bld: 6.9 % of total Hgb — ABNORMAL HIGH (ref <5.7)
Mean Plasma Glucose: 151 mg/dL
eAG (mmol/L): 8.4 mmol/L

## 2021-11-13 LAB — LIPID PANEL
Cholesterol: 157 mg/dL (ref <200)
HDL: 35 mg/dL — ABNORMAL LOW (ref >=40)
LDL Cholesterol (Calc): 89 mg/dL (calc)
Non-HDL Cholesterol (Calc): 122 mg/dL (calc) (ref <130)
Total CHOL/HDL Ratio: 4.5 (calc) (ref <5.0)
Triglycerides: 251 mg/dL — ABNORMAL HIGH (ref <150)

## 2021-11-13 MED ORDER — SITAGLIPTIN PHOSPHATE 100 MG PO TABS: 100.0000 mg | ORAL_TABLET | Freq: Every day | ORAL | 1 refills | Status: DC

## 2021-11-16 ENCOUNTER — Encounter: Payer: Self-pay | Admitting: Family Medicine

## 2021-12-02 ENCOUNTER — Encounter (INDEPENDENT_AMBULATORY_CARE_PROVIDER_SITE_OTHER): Payer: Medicare HMO | Admitting: Ophthalmology

## 2021-12-18 ENCOUNTER — Telehealth: Payer: Self-pay | Admitting: Pharmacist

## 2021-12-18 NOTE — Progress Notes (Unsigned)
Chronic Care Management Pharmacy Assistant   Name: Scott Ward  MRN: [id number] DOB: 1940-11-25   Reason for Encounter: General Adherence Call    Recent office visits:  11/12/2021 OV (PCP) Susy Frizzle, MD;  Patient will be an excellent candidate for Jardiance or Januvia however cost is an issue.  He is having a difficult time affording the Trelegy.  We discussed switching from Trelegy to [facility] to try to save money.  At the present time his family would like to continue the Trelegy. ,  I may just hold metformin and watch his sugars.  The other option will be a very low-dose glipizide such as 2.5 mg daily  Recent consult visits:  None  Hospital visits:  None in previous 6 months  Medications: Outpatient Encounter Medications as of 12/18/2021  Medication Sig Note   albuterol (VENTOLIN HFA) 108 (90 Base) MCG/ACT inhaler Inhale 2 puffs into the lungs every 6 (six) hours as needed for wheezing or shortness of breath. 11/12/2021: Uses as needed.    aspirin 81 MG tablet Take 81 mg by mouth daily.      finasteride (PROSCAR) 5 MG tablet Take 1 tablet (5 mg total) by mouth daily.    Fluticasone-Umeclidin-Vilant (TRELEGY ELLIPTA) 100-62.5-25 MCG/ACT AEPB Inhale 1 puff into the lungs daily.    furosemide (LASIX) 40 MG tablet TAKE ONE TABLET BY MOUTH DAILY AS NEEDED FOR EDEMA    lisinopril (ZESTRIL) 40 MG tablet TAKE ONE TABLET BY MOUTH DAILY    potassium chloride SA (KLOR-CON M20) 20 MEQ tablet Take 1 tablet (20 mEq total) by mouth daily.    simvastatin (ZOCOR) 20 MG tablet Take 1 tablet (20 mg total) by mouth at bedtime.    sitaGLIPtin (JANUVIA) 100 MG tablet Take 1 tablet (100 mg total) by mouth daily.    No facility-administered encounter medications on file as of 12/18/2021.   Contacted [name] for General Review Call   Chart Review:  Have there been any documented new, changed, or discontinued medications since last visit? {yes/no:20286} (If yes, include name, dose,  frequency, date) Has there been any documented recent hospitalizations or ED visits since last visit with Clinical Pharmacist? {yes/no:20286} Brief Summary (including medication and/or Diagnosis changes):   Adherence Review:  Does the Clinical Pharmacist Assistant have access to adherence rates? {yes/no:20286} Adherence rates for STAR metric medications (List medication(s)/day supply/ last 2 fill dates). Adherence rates for medications indicated for disease state being reviewed (List medication(s)/day supply/ last 2 fill dates). Does the patient have >5 day gap between last estimated fill dates for any of the above medications or other medication gaps? {yes/no:20286} Reason for medication gaps.   Disease State Questions:  Able to connect with Patient? {yes/no:20286} Did patient have any problems with their health recently? {yes/no:20286} Note problems and Concerns: Have you had any admissions or emergency room visits or worsening of your condition(s) since last visit? {yes/no:20286} Details of ED visit, hospital visit and/or worsening condition(s): Have you had any visits with new specialists or providers since your last visit? {yes/no:20286} Explain: Have you had any new health care problem(s) since your last visit? {yes/no:20286} New problem(s) reported: Have you run out of any of your medications since you last spoke with clinical pharmacist? {yes/no:20286} What caused you to run out of your medications? Are there any medications you are not taking as prescribed? {yes/no:20286} What kept you from taking your medications as prescribed? Are you having any issues or side effects with your medications? {  yes/no:20286} Note of issues or side effects: Do you have any other health concerns or questions you want to discuss with your Clinical Pharmacist before your next visit? {yes/no:20286} Note additional concerns and questions from Patient. Are there any health concerns that you feel we  can do a better job addressing? {yes/no:20286} Note Patient's response. Are you having any problems with any of the following since the last visit: (select all that apply)  {General Call:27390}  Details: 12. Any falls since last visit? {yes/no:20286}  Details: 13. Any increased or uncontrolled pain since last visit? {yes/no:20286}  Details: 14. Next visit Type: {Telephone/Office:25179}       Visit with:        Date:        Time:  16. Additional Details? {yes/no:20286}    Care Gaps: Medicare Annual Wellness: Last AWV 11/08/2019 Ophthalmology Exam: Overdue since 12/02/2020 Foot Exam: Overdue since 04/29/2020 Hemoglobin A1C: 6.6% on 05/12/2021 Colonoscopy: Completed 04/14/2004  Future Appointments  Date Time Provider [place]  02/25/2022 10:30 AM [facility]-NURSE HEALTH ADVISOR [facility]  07/15/2022  3:00 PM [facility]-CCM PHARMACIST [facility]   Star Rating Drugs: Metformin 500 mg last filled 10/09/2021 90 DS Simvastatin 20 mg last filled 09/22/2021 90 DS Lisinopril 40 mg last filled 10/05/2021 90 DS  April D Calhoun, [place] Pharmacist Assistant [phone]

## 2021-12-30 ENCOUNTER — Other Ambulatory Visit: Payer: Self-pay

## 2021-12-30 DIAGNOSIS — M7989 Other specified soft tissue disorders: Secondary | ICD-10-CM

## 2021-12-30 NOTE — Telephone Encounter (Signed)
  Prescription Request  12/30/2021  Is this a "Controlled Substance" medicine? No  LOV: 12/18/2021   What is the name of the medication or equipment? potassium chloride SA (KLOR-CON M20) 20 MEQ tablet [[id number]]   Have you contacted your pharmacy to request a refill? Yes   Which pharmacy would you like this sent to?  [facility] [id number], [address] [place] [address] Phone: [phone] Fax: [phone]   Patient notified that their request is being sent to the clinical staff for review and that they should receive a response within 2 business days.   Please advise at [phone]

## 2021-12-30 NOTE — Telephone Encounter (Signed)
Requested medication (s) are due for refill today: yes  Requested medication (s) are on the active medication list: yes  Last refill:  06/25/21 #90 1 refills  Future visit scheduled: no  Notes to clinic:   protocol failed. Last potassium 5.4 (H) 11/12/21 . Do you want to continue refill?     Requested Prescriptions  Pending Prescriptions Disp Refills   potassium chloride SA (KLOR-CON M20) 20 MEQ tablet 90 tablet 1    Sig: Take 1 tablet (20 mEq total) by mouth daily.     Endocrinology:  Minerals - Potassium Supplementation Failed - 12/30/2021 12:33 PM      Failed - K in normal range and within 360 days    Potassium  Date Value Ref Range Status  11/12/2021 5.4 (H) 3.5 - 5.3 mmol/L Final         Failed - Cr in normal range and within 360 days    Creat  Date Value Ref Range Status  11/12/2021 2.32 (H) 0.70 - 1.22 mg/dL Final         Passed - Valid encounter within last 12 months    Recent Outpatient Visits           7 months ago Controlled type 2 diabetes mellitus without complication, without long-term current use of insulin ([place])   [place] [name], [name], MD   1 year ago Controlled type 2 diabetes mellitus without complication, without long-term current use of insulin ([place])   [place] [name], [name], MD   1 year ago Controlled type 2 diabetes mellitus without complication, without long-term current use of insulin ([place])   [place] Susy Frizzle, MD   2 years ago Controlled type 2 diabetes mellitus without complication, without long-term current use of insulin ([place])   [place] [name], [name], MD   2 years ago Benign essential HTN   [place] [name], [name], MD

## 2021-12-30 NOTE — Telephone Encounter (Signed)
  Prescription Request  12/30/2021  Is this a "Controlled Substance" medicine? No  LOV: 12/18/2021   What is the name of the medication or equipment? potassium chloride SA (KLOR-CON M20) 20 MEQ tablet [[id number]]   Have you contacted your pharmacy to request a refill? Yes   Which pharmacy would you like this sent to?  [facility] [id number], [address] [place] [address] Phone: [phone] Fax: [phone]   Patient notified that their request is being sent to the clinical staff for review and that they should receive a response within 2 business days.   Please advise at [phone]

## 2022-01-01 ENCOUNTER — Other Ambulatory Visit: Payer: Self-pay

## 2022-01-01 MED ORDER — SIMVASTATIN 20 MG PO TABS: 20.0000 mg | ORAL_TABLET | Freq: Every day | ORAL | 3 refills | Status: DC

## 2022-01-01 MED ORDER — POTASSIUM CHLORIDE CRYS ER 20 MEQ PO TBCR: 20.0000 meq | EXTENDED_RELEASE_TABLET | Freq: Every day | ORAL | 1 refills | Status: AC

## 2022-01-01 NOTE — Telephone Encounter (Signed)
Requested Prescriptions  Pending Prescriptions Disp Refills   simvastatin (ZOCOR) 20 MG tablet 90 tablet 3    Sig: Take 1 tablet (20 mg total) by mouth at bedtime.     Cardiovascular:  Antilipid - Statins Failed - 01/01/2022  2:27 PM      Failed - Lipid Panel in normal range within the last 12 months    Cholesterol  Date Value Ref Range Status  11/12/2021 157 <200 mg/dL Final   LDL Cholesterol (Calc)  Date Value Ref Range Status  11/12/2021 89 mg/dL (calc) Final    Comment:    Reference range: <100 . Desirable range <100 mg/dL for primary prevention;   <70 mg/dL for patients with CHD or diabetic patients  with > or = 2 CHD risk factors. Marland Kitchen LDL-C is now calculated using the Martin-Hopkins  calculation, which is a validated novel method providing  better accuracy than the Friedewald equation in the  estimation of LDL-C.  Cresenciano Genre et al. Annamaria Helling. 2130;865(78): 2061-2068  (http://education.[facility].com/faq/FAQ164)    HDL  Date Value Ref Range Status  11/12/2021 35 (L) > OR = 40 mg/dL Final   Triglycerides  Date Value Ref Range Status  11/12/2021 251 (H) <150 mg/dL Final    Comment:    . If a non-fasting specimen was collected, consider repeat triglyceride testing on a fasting specimen if clinically indicated.  Yates Decamp et al. J. of Clin. Lipidol. 4696;2:952-841. Marland Kitchen          Passed - Patient is not pregnant      Passed - Valid encounter within last 12 months    Recent Outpatient Visits           7 months ago Controlled type 2 diabetes mellitus without complication, without long-term current use of insulin ([place])   [place] [name], [name], MD   1 year ago Controlled type 2 diabetes mellitus without complication, without long-term current use of insulin ([place])   [place] Susy Frizzle, MD   1 year ago Controlled type 2 diabetes mellitus without complication, without long-term current use of insulin ([place])   [place] Susy Frizzle, MD   2 years ago Controlled type 2 diabetes mellitus without complication, without long-term current use of insulin ([place])   [place] Susy Frizzle, MD   2 years ago Benign essential HTN   [place] [name], [name], MD       Future Appointments             In 3 days [name], [name], MD [place]

## 2022-01-01 NOTE — Telephone Encounter (Signed)
  Prescription Request  01/01/2022  Is this a "Controlled Substance" medicine? No  LOV: 12/30/2021   What is the name of the medication or equipment? simvastatin (ZOCOR) 20 MG   Have you contacted your pharmacy to request a refill? Yes   Which pharmacy would you like this sent to?  [facility] [id number], [address] [place] [address] Phone: [phone] Fax: [phone]   Patient notified that their request is being sent to the clinical staff for review and that they should receive a response within 2 business days.   Please advise at [phone]

## 2022-01-04 ENCOUNTER — Ambulatory Visit (INDEPENDENT_AMBULATORY_CARE_PROVIDER_SITE_OTHER): Payer: Medicare HMO | Admitting: Family Medicine

## 2022-01-04 VITALS — BP 136/84 | HR 83 | Ht 71.0 in | Wt 206.0 lb

## 2022-01-04 DIAGNOSIS — M5431 Sciatica, right side: Secondary | ICD-10-CM

## 2022-01-04 MED ORDER — PREDNISONE 20 MG PO TABS: 40.0000 mg | ORAL_TABLET | Freq: Every day | ORAL | 0 refills | Status: DC

## 2022-01-04 NOTE — Progress Notes (Signed)
Subjective:    Patient ID: Scott Ward, male    DOB: January 23, 1941, 81 y.o.   MRN: [id number]  Patient had an MRI in November 2022.  MRI showed lateral recess stenosis at L4-L5 impinging upon the L4 nerve root.  He also had a disc osteophyte complex at L5-S1 impinging upon the right L5 nerve root.  He did try cortisone injections in the past with minimal improvement.  Has been taking Tylenol for the pain.  About 2 weeks ago he started developing right-sided low back pain radiating into his right gluteus and down his right leg behind his right knee into his right calf.  He describes the pain as a burning stinging pain.  The longer he stands the worse it gets.  He also reports weakness in his right leg and some tingling Past Medical History:  Diagnosis Date   Arthritis    "lower back" (03/02/2016)   CAP (community acquired pneumonia) 2018   Right Middle Lobe   Colon polyp    COPD (chronic obstructive pulmonary disease) ([place]) dx'd 03/02/2016   Dermatochalasis    Diabetes mellitus without complication ([place])    Fatty liver    High blood pressure    High cholesterol    History of BPH    History of colon polyps    HOH (hard of hearing)    Nicotine abuse    Right carotid bruit    Past Surgical History:  Procedure Laterality Date   BLEPHAROPLASTY Bilateral    CATARACT EXTRACTION W/ INTRAOCULAR LENS  IMPLANT, BILATERAL     COLONOSCOPY     TRANSURETHRAL RESECTION OF PROSTATE N/A 02/06/2019   Procedure: TRANSURETHRAL RESECTION OF THE PROSTATE (TURP);  Surgeon: Irine Seal, MD;  Location: [facility];  Service: Urology;  Laterality: N/A;   Current Outpatient Medications on File Prior to Visit  Medication Sig Dispense Refill   albuterol (VENTOLIN HFA) 108 (90 Base) MCG/ACT inhaler Inhale 2 puffs into the lungs every 6 (six) hours as needed for wheezing or shortness of breath. 18 g 1   aspirin 81 MG tablet Take 81 mg by mouth daily.       finasteride (PROSCAR) 5 MG tablet Take 1 tablet (5 mg total) by  mouth daily. 90 tablet 3   Fluticasone-Umeclidin-Vilant (TRELEGY ELLIPTA) 100-62.5-25 MCG/ACT AEPB Inhale 1 puff into the lungs daily. 1 each 11   furosemide (LASIX) 40 MG tablet TAKE ONE TABLET BY MOUTH DAILY AS NEEDED FOR EDEMA 90 tablet 0   lisinopril (ZESTRIL) 40 MG tablet TAKE ONE TABLET BY MOUTH DAILY 90 tablet 1   potassium chloride SA (KLOR-CON M20) 20 MEQ tablet Take 1 tablet (20 mEq total) by mouth daily. 90 tablet 1   simvastatin (ZOCOR) 20 MG tablet Take 1 tablet (20 mg total) by mouth at bedtime. 90 tablet 3   sitaGLIPtin (JANUVIA) 100 MG tablet Take 1 tablet (100 mg total) by mouth daily. 90 tablet 1   No current facility-administered medications on file prior to visit.   No Known Allergies  Social History   Socioeconomic History   Marital status: Divorced    Spouse name: Not on file   Number of children: 1   Years of education: 8   Highest education level: Not on file  Occupational History   Occupation: RETIRED  Tobacco Use   Smoking status: Every Day    Packs/day: 1.00    Years: 60.00    Total pack years: 60.00    Types: Cigarettes   Smokeless  tobacco: Never  Vaping Use   Vaping Use: Never used  Substance and Sexual Activity   Alcohol use: Not Currently    Comment: 03/02/2016 "nothing since 1973"   Drug use: No   Sexual activity: Never  Other Topics Concern   Not on file  Social History Narrative   Patient is single with one child.   Patient is left handed.   Patient has an 8 th grade education.   Patient drinks up to 3 cups daily.   Social Determinants of Health   Financial Resource Strain: Low Risk  (02/19/2021)   Overall Financial Resource Strain (CARDIA)    Difficulty of Paying Living Expenses: Not hard at all  Food Insecurity: No Food Insecurity (02/19/2021)   Hunger Vital Sign    Worried About Running Out of Food in the Last Year: Never true    Ran Out of Food in the Last Year: Never true  Transportation Needs: No Transportation Needs  (02/19/2021)   PRAPARE - Hydrologist (Medical): No    Lack of Transportation (Non-Medical): No  Physical Activity: Inactive (02/19/2021)   Exercise Vital Sign    Days of Exercise per Week: 0 days    Minutes of Exercise per Session: 0 min  Stress: No Stress Concern Present (02/19/2021)   [place]    Feeling of Stress : Not at all  Social Connections: Socially Isolated (02/19/2021)   Social Connection and Isolation Panel [NHANES]    Frequency of Communication with Friends and Family: More than three times a week    Frequency of Social Gatherings with Friends and Family: More than three times a week    Attends Religious Services: Never    Marine scientist or Organizations: No    Attends Archivist Meetings: Never    Marital Status: Divorced  Human resources officer Violence: Not At Risk (02/19/2021)   Humiliation, Afraid, Rape, and Kick questionnaire    Fear of Current or Ex-Partner: No    Emotionally Abused: No    Physically Abused: No    Sexually Abused: No   Family History  Problem Relation Age of Onset   Heart attack Father    Heart attack Sister    Lung cancer Sister       Review of Systems  All other systems reviewed and are negative.      Objective:   Physical Exam Vitals reviewed.  Constitutional:      General: He is not in acute distress.    Appearance: He is well-developed. He is not diaphoretic.  HENT:     Head: Normocephalic and atraumatic.     Right Ear: External ear normal.     Left Ear: External ear normal.  Neck:     Vascular: No JVD.  Cardiovascular:     Rate and Rhythm: Normal rate and regular rhythm.     Heart sounds: Normal heart sounds. No murmur heard.    No friction rub. No gallop.  Pulmonary:     Effort: Pulmonary effort is normal. No respiratory distress.     Breath sounds: No stridor. No wheezing, rhonchi or rales.  Chest:      Chest wall: No tenderness.  Musculoskeletal:     Cervical back: Normal range of motion and neck supple.     Lumbar back: Decreased range of motion.       Legs:  Lymphadenopathy:     Cervical: No cervical adenopathy.  Skin:    General: Skin is warm.     Coloration: Skin is not pale.     Findings: No erythema or rash.  Neurological:     Mental Status: He is alert and oriented to person, place, and time.     Cranial Nerves: No cranial nerve deficit.     Motor: No abnormal muscle tone.     Coordination: Coordination normal.     Deep Tendon Reflexes: Reflexes are normal and symmetric.        Assessment & Plan:  Right sided sciatica Begin prednisone 40 mg a day for 7 days and reassess.  Hopefully we can abruptly discontinue the prednisone at that point but may wean the patient down gradually if necessary.  He is a poor surgical candidate.  Even low-dose hydrocodone causes confusion so our options are limited

## 2022-01-08 ENCOUNTER — Encounter: Payer: Self-pay | Admitting: Family Medicine

## 2022-01-11 ENCOUNTER — Other Ambulatory Visit: Payer: Self-pay | Admitting: Family Medicine

## 2022-01-11 MED ORDER — TRAMADOL HCL 50 MG PO TABS: 50.0000 mg | ORAL_TABLET | Freq: Three times a day (TID) | ORAL | 0 refills | Status: DC | PRN

## 2022-01-11 MED ORDER — PREDNISONE 20 MG PO TABS: 40.0000 mg | ORAL_TABLET | Freq: Every day | ORAL | 0 refills | Status: DC

## 2022-02-25 ENCOUNTER — Other Ambulatory Visit: Payer: Medicare HMO

## 2022-02-25 DIAGNOSIS — E119 Type 2 diabetes mellitus without complications: Secondary | ICD-10-CM | POA: Diagnosis not present

## 2022-02-25 DIAGNOSIS — E7800 Pure hypercholesterolemia, unspecified: Secondary | ICD-10-CM

## 2022-02-25 DIAGNOSIS — I10 Essential (primary) hypertension: Secondary | ICD-10-CM

## 2022-02-25 DIAGNOSIS — N1832 Chronic kidney disease, stage 3b: Secondary | ICD-10-CM

## 2022-02-26 LAB — COMPLETE METABOLIC PANEL WITH GFR
AG Ratio: 1.5 (calc) (ref 1.0–2.5)
ALT: 10 U/L (ref 9–46)
AST: 10 U/L (ref 10–35)
Albumin: 3.9 g/dL (ref 3.6–5.1)
Alkaline phosphatase (APISO): 72 U/L (ref 35–144)
BUN/Creatinine Ratio: 10 (calc) (ref 6–22)
BUN: 20 mg/dL (ref 7–25)
CO2: 25 mmol/L (ref 20–32)
Calcium: 8.6 mg/dL (ref 8.6–10.3)
Chloride: 110 mmol/L (ref 98–110)
Creat: 2.08 mg/dL — ABNORMAL HIGH (ref 0.70–1.22)
Globulin: 2.6 g/dL (calc) (ref 1.9–3.7)
Glucose, Bld: 109 mg/dL — ABNORMAL HIGH (ref 65–99)
Potassium: 4.1 mmol/L (ref 3.5–5.3)
Sodium: 144 mmol/L (ref 135–146)
Total Bilirubin: 0.2 mg/dL (ref 0.2–1.2)
Total Protein: 6.5 g/dL (ref 6.1–8.1)
eGFR: 31 mL/min/{1.73_m2} — ABNORMAL LOW (ref >=60)

## 2022-02-26 LAB — LIPID PANEL
Cholesterol: 137 mg/dL (ref <200)
HDL: 39 mg/dL — ABNORMAL LOW (ref >=40)
LDL Cholesterol (Calc): 75 mg/dL (calc)
Non-HDL Cholesterol (Calc): 98 mg/dL (calc) (ref <130)
Total CHOL/HDL Ratio: 3.5 (calc) (ref <5.0)
Triglycerides: 157 mg/dL — ABNORMAL HIGH (ref <150)

## 2022-02-26 LAB — CBC WITH DIFFERENTIAL/PLATELET
Absolute Monocytes: 1026 cells/uL — ABNORMAL HIGH (ref 200–950)
Basophils Absolute: 54 cells/uL (ref 0–200)
Basophils Relative: 0.5 %
Eosinophils Absolute: 205 cells/uL (ref 15–500)
Eosinophils Relative: 1.9 %
HCT: 37 % — ABNORMAL LOW (ref 38.5–50.0)
Hemoglobin: 12.4 g/dL — ABNORMAL LOW (ref 13.2–17.1)
Lymphs Abs: 1328 cells/uL (ref 850–3900)
MCH: 32.3 pg (ref 27.0–33.0)
MCHC: 33.5 g/dL (ref 32.0–36.0)
MCV: 96.4 fL (ref 80.0–100.0)
MPV: 11.4 fL (ref 7.5–12.5)
Monocytes Relative: 9.5 %
Neutro Abs: 8186 cells/uL — ABNORMAL HIGH (ref 1500–7800)
Neutrophils Relative %: 75.8 %
Platelets: 185 10*3/uL (ref 140–400)
RBC: 3.84 10*6/uL — ABNORMAL LOW (ref 4.20–5.80)
RDW: 14.3 % (ref 11.0–15.0)
Total Lymphocyte: 12.3 %
WBC: 10.8 10*3/uL (ref 3.8–10.8)

## 2022-02-26 LAB — HEMOGLOBIN A1C
Hgb A1c MFr Bld: 6.5 % of total Hgb — ABNORMAL HIGH (ref <5.7)
Mean Plasma Glucose: 140 mg/dL
eAG (mmol/L): 7.7 mmol/L

## 2022-03-02 ENCOUNTER — Other Ambulatory Visit: Payer: Self-pay | Admitting: Family Medicine

## 2022-04-05 NOTE — Patient Instructions (Signed)
Scott Ward , Thank you for taking time to come for your Medicare Wellness Visit. I appreciate your ongoing commitment to your health goals. Please review the following plan we discussed and let me know if I can assist you in the future.   These are the goals we discussed:  Goals      Exercise 3x per week (30 min per time)     Try to do chair exercises.     Track and Manage My Blood Pressure-Hypertension     Timeframe:  Long-Range Goal Priority:  High Start Date:  07/09/21                           Expected End Date: 01/09/22                      Follow Up Date 08//11/23    - check blood pressure weekly - choose a place to take my blood pressure (home, clinic or office, retail store)    Why is this important?   You won't feel high blood pressure, but it can still hurt your blood vessels.  High blood pressure can cause heart or kidney problems. It can also cause a stroke.  Making lifestyle changes like losing a little weight or eating less salt will help.  Checking your blood pressure at home and at different times of the day can help to control blood pressure.  If the doctor prescribes medicine remember to take it the way the doctor ordered.  Call the office if you cannot afford the medicine or if there are questions about it.     Notes:         This is a list of the screening recommended for you and due dates:  Health Maintenance  Topic Date Due   Yearly kidney health urinalysis for diabetes  Never done   Zoster (Shingles) Vaccine (1 of 2) Never done   DTaP/Tdap/Td vaccine (2 - Tdap) 03/10/2014   Complete foot exam   04/29/2020   Eye exam for diabetics  12/02/2020   COVID-19 Vaccine (4 - 2023-24 season) 10/30/2021   Medicare Annual Wellness Visit  02/19/2022   Hemoglobin A1C  08/27/2022   Yearly kidney function blood test for diabetes  02/26/2023   Pneumonia Vaccine  Completed   Flu Shot  Completed   HPV Vaccine  Aged Out    Advanced directives: Please bring a copy of  your health care power of attorney and living will to the office to be added to your chart at your convenience.   Conditions/risks identified: Aim for 30 minutes of exercise or brisk walking, 6-8 glasses of water, and 5 servings of fruits and vegetables each day.   Next appointment: Follow up in one year for your annual wellness visit.   Preventive Care 30 Years and Older, Male  Preventive care refers to lifestyle choices and visits with your health care provider that can promote health and wellness. What does preventive care include? A yearly physical exam. This is also called an annual well check. Dental exams once or twice a year. Routine eye exams. Ask your health care provider how often you should have your eyes checked. Personal lifestyle choices, including: Daily care of your teeth and gums. Regular physical activity. Eating a healthy diet. Avoiding tobacco and drug use. Limiting alcohol use. Practicing safe sex. Taking low doses of aspirin every day. Taking vitamin and mineral supplements as recommended by  your health care provider. What happens during an annual well check? The services and screenings done by your health care provider during your annual well check will depend on your age, overall health, lifestyle risk factors, and family history of disease. Counseling  Your health care provider may ask you questions about your: Alcohol use. Tobacco use. Drug use. Emotional well-being. Home and relationship well-being. Sexual activity. Eating habits. History of falls. Memory and ability to understand (cognition). Work and work Statistician. Screening  You may have the following tests or measurements: Height, weight, and BMI. Blood pressure. Lipid and cholesterol levels. These may be checked every 5 years, or more frequently if you are over 59 years old. Skin check. Lung cancer screening. You may have this screening every year starting at age 74 if you have a  30-pack-year history of smoking and currently smoke or have quit within the past 15 years. Fecal occult blood test (FOBT) of the stool. You may have this test every year starting at age 74. Flexible sigmoidoscopy or colonoscopy. You may have a sigmoidoscopy every 5 years or a colonoscopy every 10 years starting at age 44. Prostate cancer screening. Recommendations will vary depending on your family history and other risks. Hepatitis C blood test. Hepatitis B blood test. Sexually transmitted disease (STD) testing. Diabetes screening. This is done by checking your blood sugar (glucose) after you have not eaten for a while (fasting). You may have this done every 1-3 years. Abdominal aortic aneurysm (AAA) screening. You may need this if you are a current or former smoker. Osteoporosis. You may be screened starting at age 22 if you are at high risk. Talk with your health care provider about your test results, treatment options, and if necessary, the need for more tests. Vaccines  Your health care provider may recommend certain vaccines, such as: Influenza vaccine. This is recommended every year. Tetanus, diphtheria, and acellular pertussis (Tdap, Td) vaccine. You may need a Td booster every 10 years. Zoster vaccine. You may need this after age 23. Pneumococcal 13-valent conjugate (PCV13) vaccine. One dose is recommended after age 47. Pneumococcal polysaccharide (PPSV23) vaccine. One dose is recommended after age 72. Talk to your health care provider about which screenings and vaccines you need and how often you need them. This information is not intended to replace advice given to you by your health care provider. Make sure you discuss any questions you have with your health care provider. Document Released: 03/14/2015 Document Revised: 11/05/2015 Document Reviewed: 12/17/2014 Elsevier Interactive Patient Education  2017 [place] Prevention in the Home Falls can cause injuries. They  can happen to people of all ages. There are many things you can do to make your home safe and to help prevent falls. What can I do on the outside of my home? Regularly fix the edges of walkways and driveways and fix any cracks. Remove anything that might make you trip as you walk through a door, such as a raised step or threshold. Trim any bushes or trees on the path to your home. Use bright outdoor lighting. Clear any walking paths of anything that might make someone trip, such as rocks or tools. Regularly check to see if handrails are loose or broken. Make sure that both sides of any steps have handrails. Any raised decks and porches should have guardrails on the edges. Have any leaves, snow, or ice cleared regularly. Use sand or salt on walking paths during winter. Clean up any spills in your garage  right away. This includes oil or grease spills. What can I do in the bathroom? Use night lights. Install grab bars by the toilet and in the tub and shower. Do not use towel bars as grab bars. Use non-skid mats or decals in the tub or shower. If you need to sit down in the shower, use a plastic, non-slip stool. Keep the floor dry. Clean up any water that spills on the floor as soon as it happens. Remove soap buildup in the tub or shower regularly. Attach bath mats securely with double-sided non-slip rug tape. Do not have throw rugs and other things on the floor that can make you trip. What can I do in the bedroom? Use night lights. Make sure that you have a light by your bed that is easy to reach. Do not use any sheets or blankets that are too big for your bed. They should not hang down onto the floor. Have a firm chair that has side arms. You can use this for support while you get dressed. Do not have throw rugs and other things on the floor that can make you trip. What can I do in the kitchen? Clean up any spills right away. Avoid walking on wet floors. Keep items that you use a lot in  easy-to-reach places. If you need to reach something above you, use a strong step stool that has a grab bar. Keep electrical cords out of the way. Do not use floor polish or wax that makes floors slippery. If you must use wax, use non-skid floor wax. Do not have throw rugs and other things on the floor that can make you trip. What can I do with my stairs? Do not leave any items on the stairs. Make sure that there are handrails on both sides of the stairs and use them. Fix handrails that are broken or loose. Make sure that handrails are as long as the stairways. Check any carpeting to make sure that it is firmly attached to the stairs. Fix any carpet that is loose or worn. Avoid having throw rugs at the top or bottom of the stairs. If you do have throw rugs, attach them to the floor with carpet tape. Make sure that you have a light switch at the top of the stairs and the bottom of the stairs. If you do not have them, ask someone to add them for you. What else can I do to help prevent falls? Wear shoes that: Do not have high heels. Have rubber bottoms. Are comfortable and fit you well. Are closed at the toe. Do not wear sandals. If you use a stepladder: Make sure that it is fully opened. Do not climb a closed stepladder. Make sure that both sides of the stepladder are locked into place. Ask someone to hold it for you, if possible. Clearly mark and make sure that you can see: Any grab bars or handrails. First and last steps. Where the edge of each step is. Use tools that help you move around (mobility aids) if they are needed. These include: Canes. Walkers. Scooters. Crutches. Turn on the lights when you go into a dark area. Replace any light bulbs as soon as they burn out. Set up your furniture so you have a clear path. Avoid moving your furniture around. If any of your floors are uneven, fix them. If there are any pets around you, be aware of where they are. Review your medicines  with your doctor. Some medicines can make  you feel dizzy. This can increase your chance of falling. Ask your doctor what other things that you can do to help prevent falls. This information is not intended to replace advice given to you by your health care provider. Make sure you discuss any questions you have with your health care provider. Document Released: 12/12/2008 Document Revised: 07/24/2015 Document Reviewed: 03/22/2014 Elsevier Interactive Patient Education  2017 [employer].

## 2022-04-05 NOTE — Progress Notes (Unsigned)
Subjective:   Scott Ward is a 82 y.o. male who presents for Medicare Annual/Subsequent preventive examination.  Review of Systems    ***       Objective:    There were no vitals filed for this visit. There is no height or weight on file to calculate BMI.     02/19/2021   10:41 AM 11/08/2019    8:50 AM 09/27/2019    7:33 PM 02/06/2019    8:08 AM 02/02/2019    8:47 AM 03/02/2016    9:56 PM 03/02/2016   12:16 PM  Advanced Directives  Does Patient Have a Medical Advance Directive? No Yes No Yes Yes Yes Yes  Type of Social research officer, government;Living will  [place];Living will [place];Living will [place];Living will [place];Living will  Does patient want to make changes to medical advance directive? No - Patient declined   No - Patient declined No - Patient declined No - Patient declined   Copy of [place] in Chart?    No - copy requested No - copy requested No - copy requested   Would patient like information on creating a medical advance directive? No - Patient declined  No - Patient declined        Current Medications (verified) Outpatient Encounter Medications as of 04/06/2022  Medication Sig   albuterol (VENTOLIN HFA) 108 (90 Base) MCG/ACT inhaler Inhale 2 puffs into the lungs every 6 (six) hours as needed for wheezing or shortness of breath.   aspirin 81 MG tablet Take 81 mg by mouth daily.     finasteride (PROSCAR) 5 MG tablet Take 1 tablet (5 mg total) by mouth daily.   Fluticasone-Umeclidin-Vilant (TRELEGY ELLIPTA) 100-62.5-25 MCG/ACT AEPB Inhale 1 puff into the lungs daily.   furosemide (LASIX) 40 MG tablet TAKE ONE TABLET BY MOUTH DAILY AS NEEDED FOR EDEMA   lisinopril (ZESTRIL) 40 MG tablet TAKE ONE TABLET BY MOUTH DAILY   potassium chloride SA (KLOR-CON M20) 20 MEQ tablet Take 1 tablet (20 mEq total) by mouth daily.   predniSONE (DELTASONE) 20 MG tablet  Take 2 tablets (40 mg total) by mouth daily with breakfast.   simvastatin (ZOCOR) 20 MG tablet Take 1 tablet (20 mg total) by mouth at bedtime.   sitaGLIPtin (JANUVIA) 100 MG tablet Take 1 tablet (100 mg total) by mouth daily.   traMADol (ULTRAM) 50 MG tablet TAKE 1 TABLET BY MOUTH EVERY 8 HOURS AS NEEDED   No facility-administered encounter medications on file as of 04/06/2022.    Allergies (verified) Patient has no known allergies.   History: Past Medical History:  Diagnosis Date   Arthritis    "lower back" (03/02/2016)   CAP (community acquired pneumonia) 2018   Right Middle Lobe   Colon polyp    COPD (chronic obstructive pulmonary disease) ([place]) dx'd 03/02/2016   Dermatochalasis    Diabetes mellitus without complication ([place])    Fatty liver    High blood pressure    High cholesterol    History of BPH    History of colon polyps    HOH (hard of hearing)    Nicotine abuse    Right carotid bruit    Past Surgical History:  Procedure Laterality Date   BLEPHAROPLASTY Bilateral    CATARACT EXTRACTION W/ INTRAOCULAR LENS  IMPLANT, BILATERAL     COLONOSCOPY     TRANSURETHRAL RESECTION OF PROSTATE N/A 02/06/2019  Procedure: TRANSURETHRAL RESECTION OF THE PROSTATE (TURP);  Surgeon: Irine Seal, MD;  Location: [facility];  Service: Urology;  Laterality: N/A;   Family History  Problem Relation Age of Onset   Heart attack Father    Heart attack Sister    Lung cancer Sister    Social History   Socioeconomic History   Marital status: Divorced    Spouse name: Not on file   Number of children: 1   Years of education: 8   Highest education level: Not on file  Occupational History   Occupation: RETIRED  Tobacco Use   Smoking status: Every Day    Packs/day: 1.00    Years: 60.00    Total pack years: 60.00    Types: Cigarettes   Smokeless tobacco: Never  Vaping Use   Vaping Use: Never used  Substance and Sexual Activity   Alcohol use: Not Currently    Comment: 03/02/2016 "nothing  since 1973"   Drug use: No   Sexual activity: Never  Other Topics Concern   Not on file  Social History Narrative   Patient is single with one child.   Patient is left handed.   Patient has an 8 th grade education.   Patient drinks up to 3 cups daily.   Social Determinants of Health   Financial Resource Strain: Low Risk  (02/19/2021)   Overall Financial Resource Strain (CARDIA)    Difficulty of Paying Living Expenses: Not hard at all  Food Insecurity: No Food Insecurity (02/19/2021)   Hunger Vital Sign    Worried About Running Out of Food in the Last Year: Never true    Ran Out of Food in the Last Year: Never true  Transportation Needs: No Transportation Needs (02/19/2021)   PRAPARE - Hydrologist (Medical): No    Lack of Transportation (Non-Medical): No  Physical Activity: Inactive (02/19/2021)   Exercise Vital Sign    Days of Exercise per Week: 0 days    Minutes of Exercise per Session: 0 min  Stress: No Stress Concern Present (02/19/2021)   [place]    Feeling of Stress : Not at all  Social Connections: Socially Isolated (02/19/2021)   Social Connection and Isolation Panel [NHANES]    Frequency of Communication with Friends and Family: More than three times a week    Frequency of Social Gatherings with Friends and Family: More than three times a week    Attends Religious Services: Never    Marine scientist or Organizations: No    Attends Archivist Meetings: Never    Marital Status: Divorced    Tobacco Counseling Ready to quit: Not Answered Counseling given: Not Answered   Clinical Intake:                 Diabetic?Yes  Nutrition Risk Assessment:  Has the patient had any N/V/D within the last 2 months?  {YES/NO:21197} Does the patient have any non-healing wounds?  {YES/NO:21197} Has the patient had any unintentional weight loss or weight  gain?  {YES/NO:21197}  Diabetes:  Is the patient diabetic?  {YES/NO:21197} If diabetic, was a CBG obtained today?  {YES/NO:21197} Did the patient bring in their glucometer from home?  {YES/NO:21197} How often do you monitor your CBG's? ***.   Financial Strains and Diabetes Management:  Are you having any financial strains with the device, your supplies or your medication? {YES/NO:21197}.  Does the patient want to be  seen by Chronic Care Management for management of their diabetes?  {YES/NO:21197} Would the patient like to be referred to a Nutritionist or for Diabetic Management?  {YES/NO:21197}  Diabetic Exams:  {Diabetic Eye Exam:2101801} {Diabetic Foot Exam:2101802}          Activities of Daily Living     No data to display          Patient Care Team: Susy Frizzle, MD as PCP - General (Family Medicine) Edythe Clarity, [facility] as Pharmacist (Pharmacist)  Indicate any recent Medical Services you may have received from other than [facility] providers in the past year (date may be approximate).     Assessment:   This is a routine wellness examination for Scott Ward.  Hearing/Vision screen No results found.  Dietary issues and exercise activities discussed:     Goals Addressed   None    Depression Screen    01/04/2022   12:28 PM 11/12/2021    8:00 AM 02/19/2021   10:39 AM 11/08/2019    8:52 AM 10/30/2018    8:18 AM 11/15/2017    8:20 AM 03/29/2017    8:56 AM  PHQ 2/9 Scores  PHQ - 2 Score 0 0 0 0 0 0 0    Fall Risk    01/04/2022   12:28 PM 02/19/2021   10:42 AM 11/08/2019    8:50 AM 10/30/2018    8:18 AM 11/15/2017    8:20 AM  Fall Risk   Falls in the past year? 0 0 1 0 No  Comment   09/29/2019    Number falls in past yr: 0 0 0    Injury with Fall? 0 0 1    Risk for fall due to : No Fall Risks Impaired balance/gait;Impaired mobility;Impaired vision     Follow up Falls prevention discussed Falls prevention discussed  Falls evaluation completed     FALL RISK  PREVENTION PERTAINING TO THE HOME:  Any stairs in or around the home? {YES/NO:21197} If so, are there any without handrails? {YES/NO:21197} Home free of loose throw rugs in walkways, pet beds, electrical cords, etc? {YES/NO:21197} Adequate lighting in your home to reduce risk of falls? {YES/NO:21197}  ASSISTIVE DEVICES UTILIZED TO PREVENT FALLS:  Life alert? {YES/NO:21197} Use of a cane, walker or w/c? {YES/NO:21197} Grab bars in the bathroom? {YES/NO:21197} Shower chair or bench in shower? {YES/NO:21197} Elevated toilet seat or a handicapped toilet? {YES/NO:21197}  TIMED UP AND GO:  Was the test performed? No . Telephonic visit   Cognitive Function:        11/08/2019    8:51 AM  6CIT Screen  What Year? 0 points  What month? 0 points  What time? 0 points  Count back from 20 0 points  Months in reverse 0 points  Repeat phrase 0 points  Total Score 0 points    Immunizations Immunization History  Administered Date(s) Administered   Fluad Quad(high Dose 65+) 10/26/2018, 11/08/2019, 12/15/2020   Influenza Whole 12/22/2007, 12/25/2008, 01/07/2010   Influenza, High Dose Seasonal PF 01/04/2017   Influenza,inj,Quad PF,6+ Mos 01/18/2013, 05/17/2014, 11/22/2014, 03/03/2016, 11/15/2017   Influenza-Unspecified 11/30/2021   Moderna Sars-Covid-2 Vaccination 05/07/2019, 06/04/2019, 02/18/2020   Pneumococcal Conjugate-13 01/18/2013   Pneumococcal Polysaccharide-23 03/10/2004, 05/20/2014   Td 03/10/2004    TDAP status: Due, Education has been provided regarding the importance of this vaccine. Advised may receive this vaccine at local pharmacy or Health Dept. Aware to provide a copy of the vaccination record if obtained from local pharmacy or Health  Dept. Verbalized acceptance and understanding.  Flu Vaccine status: Up to date  Pneumococcal vaccine status: Up to date  Covid-19 vaccine status: Information provided on how to obtain vaccines.   Qualifies for Shingles Vaccine? Yes    Zostavax completed No   Shingrix Completed?: No.    Education has been provided regarding the importance of this vaccine. Patient has been advised to call insurance company to determine out of pocket expense if they have not yet received this vaccine. Advised may also receive vaccine at local pharmacy or Health Dept. Verbalized acceptance and understanding.  Screening Tests Health Maintenance  Topic Date Due   Diabetic kidney evaluation - Urine ACR  Never done   Zoster Vaccines- Shingrix (1 of 2) Never done   DTaP/Tdap/Td (2 - Tdap) 03/10/2014   FOOT EXAM  04/29/2020   OPHTHALMOLOGY EXAM  12/02/2020   COVID-19 Vaccine (4 - 2023-24 season) 10/30/2021   Medicare Annual Wellness (AWV)  02/19/2022   HEMOGLOBIN A1C  08/27/2022   Diabetic kidney evaluation - eGFR measurement  02/26/2023   Pneumonia Vaccine 31+ Years old  Completed   INFLUENZA VACCINE  Completed   HPV VACCINES  Aged Out    Health Maintenance  Health Maintenance Due  Topic Date Due   Diabetic kidney evaluation - Urine ACR  Never done   Zoster Vaccines- Shingrix (1 of 2) Never done   DTaP/Tdap/Td (2 - Tdap) 03/10/2014   FOOT EXAM  04/29/2020   OPHTHALMOLOGY EXAM  12/02/2020   COVID-19 Vaccine (4 - 2023-24 season) 10/30/2021   Medicare Annual Wellness (AWV)  02/19/2022    Colorectal cancer screening: No longer required.   Lung Cancer Screening: (Low Dose CT Chest recommended if Age 55-80 years, 30 pack-year currently smoking OR have quit w/in 15years.) does not qualify.   Lung Cancer Screening Referral: n/a   Additional Screening:  Hepatitis C Screening: does not qualify  Vision Screening: Recommended annual ophthalmology exams for early detection of glaucoma and other disorders of the eye. Is the patient up to date with their annual eye exam?  {YES/NO:21197} Who is the provider or what is the name of the office in which the patient attends annual eye exams? *** If pt is not established with a provider, would  they like to be referred to a provider to establish care? {YES/NO:21197}.   Dental Screening: Recommended annual dental exams for proper oral hygiene  Community Resource Referral / Chronic Care Management: CRR required this visit?  {YES/NO:21197}  CCM required this visit?  {YES/NO:21197}     Plan:     I have personally reviewed and noted the following in the patient's chart:   Medical and social history Use of alcohol, tobacco or illicit drugs  Current medications and supplements including opioid prescriptions. {Opioid Prescriptions:[phone]} Functional ability and status Nutritional status Physical activity Advanced directives List of other physicians Hospitalizations, surgeries, and ER visits in previous 12 months Vitals Screenings to include cognitive, depression, and falls Referrals and appointments  In addition, I have reviewed and discussed with patient certain preventive protocols, quality metrics, and best practice recommendations. A written personalized care plan for preventive services as well as general preventive health recommendations were provided to patient.     Vanetta Mulders, [place]   04/05/5807   Due to this being a virtual visit, the after visit summary with patients personalized plan was offered to patient via mail or my-chart. ***Patient declined at this time./ Patient would like to access on my-chart/ per request, patient was  mailed a copy of AVS./ Patient preferred to pick up at office at next visit   Nurse Notes: ***

## 2022-04-06 ENCOUNTER — Ambulatory Visit (INDEPENDENT_AMBULATORY_CARE_PROVIDER_SITE_OTHER): Payer: Medicare HMO

## 2022-04-06 VITALS — Ht 71.0 in | Wt 206.0 lb

## 2022-04-06 DIAGNOSIS — Z0000 Encounter for general adult medical examination without abnormal findings: Secondary | ICD-10-CM | POA: Diagnosis not present

## 2022-04-07 ENCOUNTER — Other Ambulatory Visit: Payer: Self-pay | Admitting: Family Medicine

## 2022-04-09 ENCOUNTER — Telehealth: Payer: Self-pay | Admitting: *Deleted

## 2022-04-09 NOTE — Progress Notes (Signed)
  Care Coordination   Note   04/09/2022 Name: GLEEN RIPBERGER MRN: [id number] DOB: 1940/07/20  [name] is a 82 y.o. year old male who sees [name], [name], MD for primary care. I reached out to [name] by phone today to offer care coordination services.  Mr. Highbaugh was given information about Care Coordination services today including:   The Care Coordination services include support from the care team which includes your Nurse Coordinator, Clinical Social Worker, or Pharmacist.  The Care Coordination team is here to help remove barriers to the health concerns and goals most important to you. Care Coordination services are voluntary, and the patient may decline or stop services at any time by request to their care team member.   Care Coordination Consent Status: Patient agreed to services and verbal consent obtained.   Follow up plan:  Telephone appointment with care coordination team member scheduled for:  04/12/22  Encounter Outcome:  Pt. Scheduled  [place]  Direct Dial: [phone]

## 2022-04-12 ENCOUNTER — Ambulatory Visit: Payer: Self-pay | Admitting: Licensed Clinical Social Worker

## 2022-04-12 NOTE — Patient Outreach (Signed)
  Care Coordination   Initial Visit Note   04/12/2022 Name: Scott Ward MRN: [id number] DOB: 1940/09/09  Scott Ward is a 82 y.o. year old male who sees [name], [name], MD for primary care. I spoke with Rosalva Ferron, son of client, by phone today and discussed client needs.  What matters to the patients health and wellness today?  Patient and son are seeking information about in home care resources for client. They are seeking information about level of care options for client at ALF or SNF    Goals Addressed             This Visit's Progress    patient and son of patient are learning about in home care resources/support and learning about ALF and SNF levels of care       Interventions: LCSW called Rosalva Ferron, son of client , today and spoke via phone with [name] about care needs of client LCSW and Erika Hussar spoke of level of care needs of client Discussion of client completion of daily ADLs. Discussion of sleeping issues of client and appetite of client. Reviewed ambulation challenges of client. Rosalva Ferron said client uses a rollator walker to help client walk Reviewed program support with nursing, SW and Phamarcy [name] and LCSW spoke of in home care agency support available for client in area. Discussed client support with PCP Discussed short term client stay for facility care versus having someone in client home for 24 hours daily to care for client [name] said he was primary care provider for client. LCSW provided Rosalva Ferron with LCSW phone number and name. LCSW encouraged Rosalva Ferron to call LCSW as needed to discuss client needs LCSW to call Rosalva Ferron in 4 weeks to discuss client needs at that time      SDOH assessments and interventions completed:  Yes  SDOH Interventions Today    Flowsheet Row Most Recent Value  SDOH Interventions   Physical Activity Interventions Other (Comments)  [uses a rollator walker to help him walk]  Stress Interventions Other  (Comment)  [client has some stress over managing medical needs.]  Social Connections Interventions Other (Comment)  [client may be at risk for some social isolation]        Care Coordination Interventions:  Yes, provided   Interventions Today    Flowsheet Row Most Recent Value  Chronic Disease   Chronic disease during today's visit Other  [fall risk discussed,  client has CKD,  client has pain issues,  client is HOH]  General Interventions   General Interventions Discussed/Reviewed General Interventions Discussed  [discussed program services]  Education Interventions   Education Provided Provided Education  [provided education to Rosalva Ferron, son of client, about ALF, SNF and in home care support for client]  Provided Verbal Education On [employer]  Safety Interventions   Safety Discussed/Reviewed Fall Risk  [client had a fall one month ago. client uses rollator walker to walk]        Follow up plan: Follow up call scheduled for 05/17/22 at 10:30 AM    Encounter Outcome:  Pt. Visit Completed   Norva Riffle.Gracy Ehly MSW, [place] [employer] representative [facility] Care Management [phone]

## 2022-04-12 NOTE — Patient Instructions (Signed)
Visit Information  Thank you for taking time to visit with me today. Please don't hesitate to contact me if I can be of assistance to you before our next scheduled telephone appointment.  Following are the goals we discussed today:    Our next appointment is by telephone on 05/17/22 at 10:30 AM   Please call the care guide team at [phone] if you need to cancel or reschedule your appointment.   If you are experiencing a Mental Health or  or need someone to talk to, please go to [facility] [place] [phone])   Following is a copy of your full plan of care:   Interventions: LCSW called Rosalva Ferron, son of client , today and spoke via phone with [name] about care needs of client LCSW and Breshawn Colona spoke of level of care needs of client Discussion of client completion of daily ADLs. Discussion of sleeping issues of client and appetite of client. Reviewed ambulation challenges of client. Rosalva Ferron said client uses a rollator walker to help client walk Reviewed program support with nursing, SW and Phamarcy [name] and LCSW spoke of in home care agency support available for client in area. Discussed client support with PCP Discussed short term client stay for facility care versus having someone in client home for 24 hours daily to care for client [name] said he was primary care provider for client. LCSW provided Rosalva Ferron with LCSW phone number and name. LCSW encouraged Rosalva Ferron to call LCSW as needed to discuss client needs LCSW to call Rosalva Ferron in 4 weeks to discuss client needs at that time  Mr. Holms was given information about Care Management services by the embedded care coordination team including:  Care Management services include personalized support from designated clinical staff supervised by his physician, including individualized plan of care and coordination with other care  providers 24/7 contact phone numbers for assistance for urgent and routine care needs. The patient may stop CCM services at any time (effective at the end of the month) by phone call to the office staff.  Patient agreed to services and verbal consent obtained.   Norva Riffle.Jhoana Upham MSW, [place] [employer] representative [facility] Care Management [phone]

## 2022-04-19 ENCOUNTER — Other Ambulatory Visit: Payer: Self-pay | Admitting: Family Medicine

## 2022-04-20 ENCOUNTER — Other Ambulatory Visit: Payer: Self-pay

## 2022-04-20 DIAGNOSIS — N401 Enlarged prostate with lower urinary tract symptoms: Secondary | ICD-10-CM

## 2022-04-20 MED ORDER — FINASTERIDE 5 MG PO TABS: 5.0000 mg | ORAL_TABLET | Freq: Every day | ORAL | 1 refills | Status: DC

## 2022-05-10 ENCOUNTER — Telehealth: Payer: Self-pay | Admitting: Family Medicine

## 2022-05-10 MED ORDER — SITAGLIPTIN PHOSPHATE 100 MG PO TABS: 100.0000 mg | ORAL_TABLET | Freq: Every day | ORAL | 1 refills | Status: DC

## 2022-05-10 NOTE — Telephone Encounter (Signed)
  Prescription Request  05/10/2022  LOV: 01/04/2022  What is the name of the medication or equipment? sitaGLIPtin (JANUVIA) 100 MG tablet   Have you contacted your pharmacy to request a refill? Yes   Which pharmacy would you like this sent to?  [facility] [id number] [place] [place] [address] Phone: [phone] Fax: [phone]    Patient notified that their request is being sent to the clinical staff for review and that they should receive a response within 2 business days.   Please advise at [place]

## 2022-05-17 ENCOUNTER — Ambulatory Visit: Payer: Self-pay | Admitting: Licensed Clinical Social Worker

## 2022-05-17 NOTE — Patient Instructions (Signed)
Visit Information  Thank you for taking time to visit with me today. Please don't hesitate to contact me if I can be of assistance to you.   Following are the goals we discussed today:   Goals Addressed             This Visit's Progress    patient and son of patient are learning about in home care resources/support and learning about ALF and SNF levels of care       Interventions: LCSW  spoke via phone today with Rosalva Ferron, son of client. Rosalva Ferron is main caregiver for client Discussed meal preparation for client. [name] said he does cooking for client. He visits client regularly. He said client is taking medications as scheduled Discussed ADLs completion of client Discussed levels of care issues . [name] said he had done some research on care facilities in [place], [place]. He said he and client are maintaining client needs and [name] said at present, client is wanting to stay at home of client with care support from his son, Keyshawn Snide Discussed client use of walker for ambulation Encouraged [place] or client to call LCSW as needed or SW support at [phone].     Our next appointment is by telephone on 07/19/22 at 1:00 PM   Please call the care guide team at [phone] if you need to cancel or reschedule your appointment.   If you are experiencing a Mental Health or [place] or need someone to talk to, please go to [facility] [place] [phone])   The patient verbalized understanding of instructions, educational materials, and care plan provided today and DECLINED offer to receive copy of patient instructions, educational materials, and care plan.   The patient has been provided with contact information for the care management team and has been advised to call with any health related questions or concerns.   Norva Riffle.Chelisa Hennen MSW, [place] Holiday representative [facility] Care  Management [phone]

## 2022-05-17 NOTE — Patient Outreach (Signed)
  Care Coordination   Follow Up Visit Note   05/17/2022 Name: Scott Ward MRN: [id number] DOB: 08/31/1940  Scott Ward is a 82 y.o. year old male who sees [name], [name], MD for primary care. I spoke with  Scott Ward, son of client, by phone today.  What matters to the patients health and wellness today? Patient and son of patient are learning about   in home care resources and support for client and learning about ALF and SNF levels of care    Goals Addressed             This Visit's Progress    patient and son of patient are learning about in home care resources/support and learning about ALF and SNF levels of care       Interventions: LCSW  spoke via phone today with Scott Ward, son of client. Scott Ward is main caregiver for client Discussed meal preparation for client. [name] said he does cooking for client. He visits client regularly. He said client is taking medications as scheduled Discussed ADLs completion of client Discussed levels of care issues . [name] said he had done some research on care facilities in [place], [place]. He said he and client are maintaining client needs and [name] said at present, client is wanting to stay at home of client with care support from his son, Ashanti Packman Discussed client use of walker for ambulation Encouraged Edilberto Jobson or client to call LCSW as needed or SW support at [phone].        SDOH assessments and interventions completed:  Yes  SDOH Interventions Today    Flowsheet Row Most Recent Value  SDOH Interventions   Physical Activity Interventions Other (Comments)  [uses rollator walker to ambulate]  Stress Interventions Other (Comment)  [client has stress related to managing medical needs]        Care Coordination Interventions:  Yes, provided   Interventions Today    Flowsheet Row Most Recent Value  Chronic Disease   Chronic disease during today's visit Other  [spoke with Scott Ward, son of  client, about needs of client]  General Interventions   General Interventions Discussed/Reviewed General Interventions Discussed  [discussed program support]  Exercise Interventions   Exercise Discussed/Reviewed Physical Activity  [discussed client doing stretching exercises]  Physical Activity Discussed/Reviewed Physical Activity Discussed  Education Interventions   Education Provided Provided Education  Provided Verbal Education On Community Resources        Follow up plan: Follow up call scheduled for 07/19/22 at 1:00 PM     Encounter Outcome:  Pt. Visit Completed   Norva Riffle.Tyus Kallam MSW, [place] [employer] representative [facility] Care Management [phone]

## 2022-05-26 ENCOUNTER — Other Ambulatory Visit: Payer: Self-pay | Admitting: Family Medicine

## 2022-05-27 NOTE — Telephone Encounter (Signed)
Requested medication (s) are due for refill today:   Yes  Requested medication (s) are on the active medication list:   Yes  Future visit scheduled:   Yes 08/02/2022 with Dr. Dennard Schaumann   Last ordered: 10/05/2021 #90, 1 refill  Returned for Dr. Samella Parr review for refill prior to upcoming appt.   Requested Prescriptions  Pending Prescriptions Disp Refills   lisinopril (ZESTRIL) 40 MG tablet [Pharmacy Med Name: LISINOPRIL 40 MG TABLET] 90 tablet 1    Sig: TAKE 1 TABLET BY MOUTH DAILY; **MUST CALL MD FOR APPOINTMENT     Cardiovascular:  ACE Inhibitors Failed - 05/26/2022 10:53 AM      Failed - Cr in normal range and within 180 days    Creat  Date Value Ref Range Status  02/25/2022 2.08 (H) 0.70 - 1.22 mg/dL Final         Failed - Valid encounter within last 6 months    Recent Outpatient Visits           1 year ago Controlled type 2 diabetes mellitus without complication, without long-term current use of insulin ([place])   [place] [name], [name], MD   1 year ago Controlled type 2 diabetes mellitus without complication, without long-term current use of insulin ()   [place] [name], [name], MD   2 years ago Controlled type 2 diabetes mellitus without complication, without long-term current use of insulin ([place])   [place] [name], [name], MD   2 years ago Controlled type 2 diabetes mellitus without complication, without long-term current use of insulin ([place])   [place] [name], [name], MD   2 years ago Benign essential HTN   [place], Warren T, MD       Future Appointments             In 2 months [name], [name], MD [facility]            Passed - K in normal range and within 180 days    Potassium  Date Value Ref Range Status  02/25/2022 4.1 3.5 - 5.3 mmol/L Final         Passed - Patient is not pregnant      Passed -  Last BP in normal range    BP Readings from Last 1 Encounters:  01/04/22 136/84

## 2022-06-01 ENCOUNTER — Other Ambulatory Visit: Payer: Self-pay | Admitting: Family Medicine

## 2022-06-03 ENCOUNTER — Telehealth: Payer: Self-pay

## 2022-06-03 ENCOUNTER — Other Ambulatory Visit: Payer: Self-pay

## 2022-06-03 DIAGNOSIS — J441 Chronic obstructive pulmonary disease with (acute) exacerbation: Secondary | ICD-10-CM

## 2022-06-03 MED ORDER — TRELEGY ELLIPTA 100-62.5-25 MCG/ACT IN AEPB: 1.0000 | INHALATION_SPRAY | Freq: Every day | RESPIRATORY_TRACT | 1 refills | Status: DC

## 2022-06-03 NOTE — Telephone Encounter (Signed)
Prescription Request  06/03/2022  LOV: 04/06/72  What is the name of the medication or equipment? Fluticasone-Umeclidin-Vilant (TRELEGY ELLIPTA) 100-62.5-25 MCG/ACT  Have you contacted your pharmacy to request a refill? Yes   Which pharmacy would you like this sent to?  [place] [id number] [place] [address] [place] [address] Phone: [phone] Fax: [phone]    Patient notified that their request is being sent to the clinical staff for review and that they should receive a response within 2 business days.   Please advise at [place]

## 2022-06-07 ENCOUNTER — Ambulatory Visit: Payer: Self-pay | Admitting: Licensed Clinical Social Worker

## 2022-06-07 NOTE — Patient Outreach (Signed)
  Care Coordination   Follow Up Visit Note   06/07/2022 Name: SHANNA RUGAR MRN: [id number] DOB: Dec 16, 1940  [name] is a 82 y.o. year old male who sees [name], Priscille Heidelberg, MD for primary care. I spoke with  [name] / [name], son of client, via phone today.   What matters to the patients health and wellness today? Client wants to remain at home at present and  do daily ADLs, Client needs meal assistance, uses walker to help him walk     Goals Addressed             This Visit's Progress    [name], son of client, said client wants to remain at home at present and do daily ADLs. client needs help with meals, uses walker to help him walk       Interventions: LCSW spoke via phone with [name], son of client, about client needs [name] said he had information on care facilities in [place], [place]. However, at present, client wants to remain at home and receive support as needed Discussed medication procurement of client. Discussed walking of client. Client uses walker to help him walk Discussed program support for client with RN, Pharmacist, and LCSW Encouraged [name] or client to call LCSW as needed for SW support at [phone].      SDOH assessments and interventions completed:  Yes  SDOH Interventions Today    Flowsheet Row Most Recent Value  SDOH Interventions   Physical Activity Interventions Other (Comments)  [uses a walker to help him walk]  Stress Interventions Other (Comment)  [has some stress in managing medical needs]        Care Coordination Interventions:  Yes, provided   Interventions Today    Flowsheet Row Most Recent Value  Chronic Disease   Chronic disease during today's visit Other  [LCSW spoke via phone with [name], son of client, about client needs]  General Interventions   General Interventions Discussed/Reviewed General Interventions Discussed, [employer] program support. Discussed in home support]   Exercise Interventions   Exercise Discussed/Reviewed Physical Activity  [uses walker to help him walk]  Education Interventions   Education Provided Provided Education  Provided Verbal Education On Community Resources  Nutrition Interventions   Nutrition Discussed/Reviewed Nutrition Discussed  Chae Robley cooks most of the meals for client]        Follow up plan: Follow up call scheduled for 08/03/22 at 1:00 PM     Encounter Outcome:  Pt. Visit Completed   Kelton Pillar.Henson Fraticelli MSW, LCSW Licensed Visual merchandiser [facility] Care Management [phone]

## 2022-06-07 NOTE — Patient Instructions (Signed)
Visit Information  Thank you for taking time to visit with me today. Please don't hesitate to contact me if I can be of assistance to you.   Following are the goals we discussed today:   Goals Addressed             This Visit's Progress    [name], son of client, said client wants to remain at home at present and do daily ADLs. client needs help with meals, uses walker to help him walk       Interventions: LCSW spoke via phone with [name], son of client, about client needs [name] said he had information on care facilities in [place], [place]. However, at present, client wants to remain at home and receive support as needed Discussed medication procurement of client. Discussed walking of client. Client uses walker to help him walk Discussed program support for client with RN, Pharmacist, and LCSW Encouraged [name] or client to call LCSW as needed for SW support at [phone].      Our next appointment is by telephone on 08/03/22 at 1:00 PM   Please call the care guide team at [phone] if you need to cancel or reschedule your appointment.   If you are experiencing a Mental Health or Behavioral Health Crisis or need someone to talk to, please go to [facility] [address], [place] [phone])   The patient / Nahiem Mahoney, son of client, verbalized understanding of instructions, educational materials, and care plan provided today and DECLINED offer to receive copy of patient instructions, educational materials, and care plan.   The patient / Erian Axson, son of client, has been provided with contact information for the care management team and has been advised to call with any health related questions or concerns.    Kelton Pillar.Ersie Savino MSW, LCSW Licensed Visual merchandiser [facility] Care Management [phone]

## 2022-06-23 ENCOUNTER — Other Ambulatory Visit: Payer: Self-pay | Admitting: Family Medicine

## 2022-07-15 ENCOUNTER — Ambulatory Visit: Payer: Medicare HMO | Admitting: Pharmacist

## 2022-07-15 DIAGNOSIS — J441 Chronic obstructive pulmonary disease with (acute) exacerbation: Secondary | ICD-10-CM

## 2022-07-15 DIAGNOSIS — E119 Type 2 diabetes mellitus without complications: Secondary | ICD-10-CM | POA: Diagnosis not present

## 2022-07-15 DIAGNOSIS — J449 Chronic obstructive pulmonary disease, unspecified: Secondary | ICD-10-CM | POA: Diagnosis not present

## 2022-07-15 NOTE — Progress Notes (Signed)
Care Management & Coordination Services Pharmacy Note  07/15/2022 Name:  Scott Ward MRN:  [id number] DOB:  03-15-1940  Summary: PharmD FU visit.  Patient son mentioned copay of Trelegy has increased 300% to > $100 per month.  Discussed applying for [facility] assistance if ok with switch.  He was approved for this program and eligible to receive for free through year 2024.    Recommendations/Changes made from today's visit: Switch to [facility] - need new rx called in to [facility] Monitor Metformin with current GFR  Follow up plan: FU 6months   Subjective: Scott Ward is an 82 y.o. year old male who is a primary patient of [name], Priscille Heidelberg, MD.  The care coordination team was consulted for assistance with disease management and care coordination needs.    Engaged with patient by telephone for follow up visit.  Recent office visits:  05/12/2021 OV (PCP) Donita Brooks, MD;  I will add tramadol 50 mg every 8 hours as needed for nerve pain in his legs.  If this does not help we can try gabapentin.    Recent consult visits:  02/04/2021 OV (Orthopedics) Tyrell Antonio, MD; no medication changes indicated.   Hospital visits:  None in previous 6 months   Objective:  Lab Results  Component Value Date   CREATININE 2.08 (H) 02/25/2022   BUN 20 02/25/2022   EGFR 31 (L) 02/25/2022   GFRNONAA 30 (L) 05/05/2020   GFRAA 34 (L) 05/05/2020   NA 144 02/25/2022   K 4.1 02/25/2022   CALCIUM 8.6 02/25/2022   CO2 25 02/25/2022   GLUCOSE 109 (H) 02/25/2022    Lab Results  Component Value Date/Time   HGBA1C 6.5 (H) 02/25/2022 08:32 AM   HGBA1C 6.9 (H) 11/12/2021 08:25 AM   MICROALBUR 1.4 05/12/2021 08:15 AM   MICROALBUR 2.7 05/05/2020 08:37 AM    Last diabetic Eye exam:  Lab Results  Component Value Date/Time   HMDIABEYEEXA No Retinopathy 12/06/2017 12:00 AM    Last diabetic Foot exam: No results found for: "HMDIABFOOTEX"   Lab Results  Component Value Date   CHOL 137  02/25/2022   HDL 39 (L) 02/25/2022   LDLCALC 75 02/25/2022   TRIG 157 (H) 02/25/2022   CHOLHDL 3.5 02/25/2022       Latest Ref Rng & Units 02/25/2022    8:32 AM 11/12/2021    8:25 AM 05/12/2021    8:45 AM  Hepatic Function  Total Protein 6.1 - 8.1 g/dL 6.5  7.3  6.7   AST 10 - 35 U/L 10  10  13    ALT 9 - 46 U/L 10  6  11    Total Bilirubin 0.2 - 1.2 mg/dL 0.2  0.2  0.2     Lab Results  Component Value Date/Time   TSH 0.180 (L) 03/03/2016 02:23 AM       Latest Ref Rng & Units 02/25/2022    8:32 AM 11/12/2021    8:25 AM 05/12/2021    8:45 AM  CBC  WBC 3.8 - 10.8 Thousand/uL 10.8  11.6  10.1   Hemoglobin 13.2 - 17.1 g/dL 40.9  81.1  91.4   Hematocrit 38.5 - 50.0 % 37.0  40.9  40.4   Platelets 140 - 400 Thousand/uL 185  258  229     No results found for: "VD25OH", "VITAMINB12"  Clinical ASCVD: No  The ASCVD Risk score (Arnett DK, et al., 2019) failed to calculate for the following reasons:   The 2019  ASCVD risk score is only valid for ages 49 to 23        04/06/2022   10:24 AM 01/04/2022   12:28 PM 11/12/2021    8:00 AM  Depression screen PHQ 2/9  Decreased Interest 0 0 0  Down, Depressed, Hopeless 0 0 0  PHQ - 2 Score 0 0 0     Social History   Tobacco Use  Smoking Status Every Day   Packs/day: 1.00   Years: 60.00   Additional pack years: 0.00   Total pack years: 60.00   Types: Cigarettes  Smokeless Tobacco Never   BP Readings from Last 3 Encounters:  01/04/22 136/84  11/12/21 132/80  05/12/21 (!) 142/88   Pulse Readings from Last 3 Encounters:  01/04/22 83  11/12/21 88  05/12/21 84   Wt Readings from Last 3 Encounters:  04/06/22 206 lb (93.4 kg)  01/04/22 206 lb (93.4 kg)  11/12/21 202 lb 6.4 oz (91.8 kg)   BMI Readings from Last 3 Encounters:  04/06/22 28.73 kg/m  01/04/22 28.73 kg/m  11/12/21 28.23 kg/m    No Known Allergies  Medications Reviewed Today     Reviewed by Erroll Luna, [facility] (Pharmacist) on 07/15/22 at 1605  Med List  Status: <None>   Medication Order Taking? Sig Documenting Provider Last Dose Status Informant  albuterol (VENTOLIN HFA) 108 (90 Base) MCG/ACT inhaler [id number] No Inhale 2 puffs into the lungs every 6 (six) hours as needed for wheezing or shortness of breath. Donita Brooks, MD Taking Active            Med Note Vedia Pereyra, Ronal Fear Nov 12, 2021  7:57 AM) Uses as needed.   aspirin 81 MG tablet [id number] No Take 81 mg by mouth daily.   [provider] Taking Active Family Member           Med Note Aurelio Jew Apr 30, 2019  8:11 AM)    finasteride (PROSCAR) 5 MG tablet [id number]  TAKE ONE TABLET BY MOUTH DAILY Donita Brooks, MD  Active   finasteride (PROSCAR) 5 MG tablet [id number]  Take 1 tablet (5 mg total) by mouth daily. Donita Brooks, MD  Active   Fluticasone-Umeclidin-Vilant (TRELEGY ELLIPTA) 100-62.5-25 MCG/ACT AEPB [id number]  Inhale 1 puff into the lungs daily. Donita Brooks, MD  Active   furosemide (LASIX) 40 MG tablet [id number] No TAKE ONE TABLET BY MOUTH DAILY AS NEEDED FOR EDEMA Donita Brooks, MD Taking Active   lisinopril (ZESTRIL) 40 MG tablet [id number]  TAKE 1 TABLET BY MOUTH DAILY; **MUST CALL MD FOR APPOINTMENT Donita Brooks, MD  Active   potassium chloride SA (KLOR-CON M20) 20 MEQ tablet [id number] No Take 1 tablet (20 mEq total) by mouth daily. Donita Brooks, MD Taking Active   predniSONE (DELTASONE) 20 MG tablet [id number] No Take 2 tablets (40 mg total) by mouth daily with breakfast. Donita Brooks, MD Taking Active   simvastatin (ZOCOR) 20 MG tablet [id number] No Take 1 tablet (20 mg total) by mouth at bedtime. Donita Brooks, MD Taking Active   sitaGLIPtin (JANUVIA) 100 MG tablet [id number]  Take 1 tablet (100 mg total) by mouth daily. Donita Brooks, MD  Active   traMADol Janean Sark) 50 MG tablet [id number]  TAKE 1 TABLET BY MOUTH EVERY 8 HOURS AS NEEDED Donita Brooks, MD  Active             SDOH:  (  Social  Determinants of Health) assessments and interventions performed: No, done within year Financial Resource Strain: Low Risk  (04/06/2022)   Overall Financial Resource Strain (CARDIA)    Difficulty of Paying Living Expenses: Not hard at all   Food Insecurity: No Food Insecurity (04/06/2022)   Hunger Vital Sign    Worried About Running Out of Food in the Last Year: Never true    Ran Out of Food in the Last Year: Never true    SDOH Interventions    Flowsheet Row Care Coordination from 06/07/2022 in [facility] [employer] Care Coordination Care Coordination from 05/17/2022 in [facility] [employer] [facility] Care Coordination Care Coordination from 04/12/2022 in [facility] [employer] Care Coordination Clinical Support from 04/06/2022 in [facility] Clinical Support from 02/19/2021 in [facility]  SDOH Interventions       Food Insecurity Interventions -- -- -- Intervention Not Indicated Intervention Not Indicated  Housing Interventions -- -- -- Intervention Not Indicated Intervention Not Indicated  Transportation Interventions -- -- -- Intervention Not Indicated Intervention Not Indicated  Utilities Interventions -- -- -- Intervention Not Indicated --  Alcohol Usage Interventions -- -- -- Intervention Not Indicated (Score <7) --  Financial Strain Interventions -- -- -- Intervention Not Indicated Intervention Not Indicated  Physical Activity Interventions Other (Comments)  [uses a walker to help him walk] Other (Comments)  [uses rollator walker to ambulate] Other (Comments)  [uses a rollator walker to help him walk] Intervention Not Indicated --  Stress Interventions Other (Comment)  [has some stress in managing medical needs] Other (Comment)  [client has stress related to managing medical needs] Other (Comment)  [client has some stress over managing medical needs.] Intervention Not Indicated Intervention Not Indicated  Social Connections  Interventions -- -- Other (Comment)  [client may be at risk for some social isolation] Intervention Not Indicated Intervention Not Indicated       Medication Assistance: None required.  Patient affirms current coverage meets needs.  Medication Access: Name and location of current pharmacy:  [facility] [id number] - [place], [place] - [address] [place] [address] Phone: [phone] Fax: [phone]  Within the past 30 days, how often has patient missed a dose of medication? 0 Is a pillbox or other method used to improve adherence? No  Factors that may affect medication adherence? no barriers identified Are meds synced by current pharmacy? No  Are meds delivered by current pharmacy? No  Does patient experience delays in picking up medications due to transportation concerns? No   Compliance/Adherence/Medication fill history: Care Gaps: Due for foot exam, diabetic kidney eval, eye exam  Star-Rating Drugs: Simvastatin 20mg  07/02/22 90ds Lisinopril 40mg  05/27/22 90ds   Assessment/Plan     Hypertension (BP goal <140/90) -Controlled -Current treatment: Lisinopril 40mg  Appropriate, Effective, Safe, Accessible -Medications previously tried: verapamil  -Current home readings: 140s/80s -Current exercise habits: not much organized exercise, can walk to the mailbox and back -Denies hypotensive/hypertensive symptoms -Educated on BP goals and benefits of medications for prevention of heart attack, stroke and kidney damage; Daily salt intake goal < 2300 mg; Symptoms of hypotension and importance of maintaining adequate hydration; -Counseled to monitor BP at home a few times per week, document, and provide log at future appointments -Recommended to continue current medication  Diabetes (A1c goal <7%) 07/15/22 -Controlled -Current medications: Metformin 500mg  twice daily with meals Appropriate, Effective, Query Safe,  Januvia 100mg   Appropriate, Effective, Safe, Accessible -Medications  previously tried: none noted  -Current home glucose readings fasting glucose: all controlled post prandial glucose:  -Denies hypoglycemic/hyperglycemic symptoms -Educated on A1c and blood sugar goals; Benefits of routine self-monitoring of blood sugar; -Counseled to check feet daily and get yearly eye exams -Continue to monitor GFR with metformin.  Ok for now but should d/c if drops below GFR 30.  COPD (Goal: control symptoms and prevent exacerbations) 07/15/22 -Controlled -Current treatment  Trelegy 100-62.5-25 Appropriate, Effective, Safe, Query Accessible Albuterol HFA Appropriate, Effective, Safe, Accessible -Medications previously tried: none noted  -MMRC/CAT score:      View : No data to display.        -Pulmonary function testing: Pulmonary Functions Testing Results:  No results found for: FEV1, FVC, FEV1FVC, TLC, DLCO  -Exacerbations requiring treatment in last 6 months: none noted -Patient reports consistent use of maintenance inhaler -Frequency of rescue inhaler use: rarely -Counseled on Proper inhaler technique; Benefits of consistent maintenance inhaler use When to use rescue inhaler Differences between maintenance and rescue inhalers -Today his son mentions that Trelegy has almost tripled in price due to donut hole I applied him for [place] program for Markus Daft - he was approved so will need new rx called in to [facility] if ok to switch per PCP.  This will allow him to get this free for the remainder of the year.        Willa Frater, PharmD, CPP Clinical Pharmacist Practitioner [facility] [phone]

## 2022-07-16 ENCOUNTER — Other Ambulatory Visit: Payer: Self-pay | Admitting: Family Medicine

## 2022-07-16 MED ORDER — BREZTRI AEROSPHERE 160-9-4.8 MCG/ACT IN AERO
2.0000 | INHALATION_SPRAY | Freq: Two times a day (BID) | RESPIRATORY_TRACT | 11 refills | Status: DC
Start: 2022-07-16 — End: 2023-08-22

## 2022-07-16 NOTE — Telephone Encounter (Signed)
Requested medication (s) are due for refill today: routing for review  Requested medication (s) are on the active medication list: yes  Last refill:  06/24/22  Future visit scheduled: no  Notes to clinic:  Unable to refill per protocol, cannot delegate. No protocol.      Requested Prescriptions  Pending Prescriptions Disp Refills   traMADol (ULTRAM) 50 MG tablet [Pharmacy Med Name: traMADol HCL 50MG  TABLET] 30 tablet     Sig: TAKE 1 TABLET BY MOUTH EVERY 8 HOURS AS NEEDED     There is no refill protocol information for this order

## 2022-07-16 NOTE — Addendum Note (Signed)
Addended by: Erroll Luna on: 07/16/2022 09:38 AM   Modules accepted: Orders

## 2022-07-19 ENCOUNTER — Ambulatory Visit: Payer: Self-pay | Admitting: Licensed Clinical Social Worker

## 2022-07-19 NOTE — Patient Outreach (Signed)
  Care Coordination   07/19/2022 Name: Scott Ward MRN: [id number] DOB: 08-08-1940   Care Coordination Outreach Attempts:  An unsuccessful telephone outreach was attempted today to offer the patient information about available care coordination services.  Follow Up Plan:  Additional outreach attempts will be made to offer the patient care coordination information and services.   Encounter Outcome:  No Answer   Care Coordination Interventions:  No, not indicated    Kelton Pillar.Daziah Hesler MSW, LCSW Licensed Visual merchandiser [facility] Care Management [phone]

## 2022-07-19 NOTE — Patient Outreach (Signed)
  Care Coordination   Follow Up Visit Note   07/19/2022 Name: Scott Ward MRN: [id number] DOB: 08-20-1940  Scott Ward is a 82 y.o. year old male who sees [name], Priscille Heidelberg, MD for primary care. I spoke with  Scott Ward / Scott Ward, son of client today about client needs   What matters to the patients health and wellness today? Patient needs help with meals. He uses  walker to help him walk    Goals Addressed             This Visit's Progress    Patient needs help with meals. He uses walker to help him walk       Interventions  Discussed client needs with Scott Ward, son of client Discussed program support for client Discussed ADLs completion. Client sometimes needs some help with his meals. He walks with use of a walker He is at risk for falls Reviewed family support. Client has support from his son, Scott Ward Encouraged client or [place]  to call LCSW to discussed SW needs of client at [phone]        SDOH assessments and interventions completed:  Yes  SDOH Interventions Today    Flowsheet Row Most Recent Value  SDOH Interventions   Physical Activity Interventions Other (Comments)  [some walking challenges.]  Stress Interventions Other (Comment)  [has stress in managing medical needs]  Social Connections Interventions Other (Comment)  [risk for social isolation]        Care Coordination Interventions:  Yes, provided    Interventions Today    Flowsheet Row Most Recent Value  Chronic Disease   Chronic disease during today's visit Other  [communicated with Scott Ward, son of client, about client needs]  General Interventions   General Interventions Discussed/Reviewed General Interventions Discussed, Scientist, water quality program support for client]  Education Interventions   Education Provided Provided Education  Provided Verbal Education On [employer]  Mental Health Interventions   Mental Health Discussed/Reviewed Anxiety   Safety Interventions   Safety Discussed/Reviewed Fall Risk       Follow up plan: Follow up call scheduled for 09/06/22 at 10:30 AM    Encounter Outcome:  Pt. Visit Completed   Kelton Pillar.Wess Baney MSW, LCSW Licensed Visual merchandiser [facility] Care Management [phone]

## 2022-07-19 NOTE — Patient Instructions (Signed)
Visit Information  Thank you for taking time to visit with me today. Please don't hesitate to contact me if I can be of assistance to you.   Following are the goals we discussed today:   Goals Addressed             This Visit's Progress    Patient needs help with meals. He uses walker to help him walk       Interventions  Discussed client needs with [name], son of client Discussed program support for client Discussed ADLs completion. Client sometimes needs some help with his meals. He walks with use of a walker He is at risk for falls Reviewed family support. Client has support from his son, Abdellah Pierog Encouraged client or [place]  to call LCSW to discussed SW needs of client at [phone]        Our next appointment is by telephone on 09/06/22 at 10:00 AM   Please call the care guide team at [phone] if you need to cancel or reschedule your appointment.   If you are experiencing a Mental Health or Behavioral Health Crisis or need someone to talk to, please go to [facility] [address], [place] [phone])   The patient  Scott Ward, son, verbalized understanding of instructions, educational materials, and care plan provided today and DECLINED offer to receive copy of patient instructions, educational materials, and care plan.   The patient Scott Ward, son, has been provided with contact information for the care management team and has been advised to call with any health related questions or concerns.   Kelton Pillar.Reonna Finlayson MSW, LCSW Licensed Visual merchandiser [facility] Care Management [phone]

## 2022-08-02 ENCOUNTER — Encounter: Payer: Medicare HMO | Admitting: Family Medicine

## 2022-08-03 ENCOUNTER — Ambulatory Visit: Payer: Self-pay | Admitting: Licensed Clinical Social Worker

## 2022-08-03 NOTE — Patient Instructions (Signed)
Visit Information  Thank you for taking time to visit with me today. Please don't hesitate to contact me if I can be of assistance to you.   Following are the goals we discussed today:   Goals Addressed             This Visit's Progress    Patient needs help with meals. He uses walker to help him walk       Interventions  Spoke with [name], son of client, via phone about client needs Discussed ambulation of client. Client uses a walker as needed to help him walk Discussed medication procurement for client Discussed client upcoming appointments. Client has appointment with PCP on September 07, 2022 Discussed pain issues of client. Client has some leg pain issues. Client cannot stand for long periods of time Discussed meal provision for client. [name] said he ensures client has adequate meals to eat.  Encouraged client or Lenorris Rutt to call LCSW as needed  for SW support for client at [phone].            Our next appointment is by telephone on 09/27/22 at 10:30 AM   Please call the care guide team at [phone] if you need to cancel or reschedule your appointment.   If you are experiencing a Mental Health or Behavioral Health Crisis or need someone to talk to, please go to [facility] [address] [place] [phone])   The patient / Scott Ward, son, verbalized understanding of instructions, educational materials, and care plan provided today and DECLINED offer to receive copy of patient instructions, educational materials, and care plan.   The patient / [name], son, has been provided with contact information for the care management team and has been advised to call with any health related questions or concerns.   Kelton Pillar.Tyrann Donaho MSW, LCSW Licensed Visual merchandiser [facility] Care Management [phone]

## 2022-08-03 NOTE — Patient Outreach (Signed)
  Care Coordination   Follow Up Visit Note   08/03/2022 Name: Scott Ward MRN: [id number] DOB: Dec 07, 1940  Scott Ward is a 82 y.o. year old male who sees [name], Priscille Heidelberg, MD for primary care. I spoke with  Scott Ward / Scott Ward, son of client via phone today.  What matters to the patients health and wellness today? Patient needs some help with meal provision. He uses a walker to help him walk    Goals Addressed             This Visit's Progress    Patient needs help with meals. He uses walker to help him walk       Interventions  Spoke with Scott Ward, son of client, via phone about client needs Discussed ambulation of client. Client uses a walker as needed to help him walk Discussed medication procurement for client Discussed client upcoming appointments. Client has appointment with PCP on September 07, 2022 Discussed pain issues of client. Client has some leg pain issues. Client cannot stand for long periods of time Discussed meal provision for client. Scott Ward said he ensures client has adequate meals to eat.  Encouraged client or Scott Ward to call LCSW as needed  for SW support for client at [phone].            SDOH assessments and interventions completed:  Yes  SDOH Interventions Today    Flowsheet Row Most Recent Value  SDOH Interventions   Physical Activity Interventions Other (Comments)  [uses walker to help him walk]  Stress Interventions Other (Comment)  [stress in managing medical needs]  Social Connections Interventions Other (Comment)  [some risk of social isolation]        Care Coordination Interventions:  Yes, provided   Interventions Today    Flowsheet Row Most Recent Value  Chronic Disease   Chronic disease during today's visit Other  [spoke with Scott Ward, son of client, about client needs]  General Interventions   General Interventions Discussed/Reviewed General Interventions Discussed, Community Resources  [reviewed program  resources]  Exercise Interventions   Exercise Discussed/Reviewed Physical Activity  [client uses walker, as needed, to help him walk]  Physical Activity Discussed/Reviewed Physical Activity Discussed  Education Interventions   Education Provided Provided Education  Provided Verbal Education On Community Resources  Mental Health Interventions   Mental Health Discussed/Reviewed Anxiety, Coping Strategies  Scott Ward has family support from his son, Scott Hua.  Scott Ward helps client with meal needs]  Nutrition Interventions   Nutrition Discussed/Reviewed Nutrition Discussed  Pharmacy Interventions   Pharmacy Dicussed/Reviewed Pharmacy Topics Discussed  Safety Interventions   Safety Discussed/Reviewed Fall Risk       Follow up plan: Follow up call scheduled for 09/27/22  at 10:30 AM    Encounter Outcome:  Pt. Visit Completed   Kelton Pillar.Sherrica Niehaus MSW, LCSW Licensed Visual merchandiser [facility] Care Management [phone]

## 2022-08-23 ENCOUNTER — Other Ambulatory Visit: Payer: Self-pay | Admitting: Family Medicine

## 2022-09-07 ENCOUNTER — Ambulatory Visit (INDEPENDENT_AMBULATORY_CARE_PROVIDER_SITE_OTHER): Payer: Medicare HMO | Admitting: Family Medicine

## 2022-09-07 ENCOUNTER — Encounter: Payer: Self-pay | Admitting: Family Medicine

## 2022-09-07 VITALS — BP 152/86 | HR 83 | Temp 97.8°F | Ht 71.0 in | Wt 202.0 lb

## 2022-09-07 DIAGNOSIS — E119 Type 2 diabetes mellitus without complications: Secondary | ICD-10-CM | POA: Diagnosis not present

## 2022-09-07 DIAGNOSIS — N1832 Chronic kidney disease, stage 3b: Secondary | ICD-10-CM

## 2022-09-07 LAB — CBC WITH DIFFERENTIAL/PLATELET
Absolute Monocytes: 865 cells/uL (ref 200–950)
Basophils Absolute: 52 cells/uL (ref 0–200)
Basophils Relative: 0.5 %
Eosinophils Absolute: 185 cells/uL (ref 15–500)
Eosinophils Relative: 1.8 %
HCT: 39.6 % (ref 38.5–50.0)
Lymphs Abs: 999 cells/uL (ref 850–3900)
MPV: 11.5 fL (ref 7.5–12.5)
Neutrophils Relative %: 79.6 %
Platelets: 221 10*3/uL (ref 140–400)
RBC: 4.14 10*6/uL — ABNORMAL LOW (ref 4.20–5.80)
Total Lymphocyte: 9.7 %

## 2022-09-07 MED ORDER — TRAMADOL HCL 50 MG PO TABS: 50.0000 mg | ORAL_TABLET | Freq: Three times a day (TID) | ORAL | 0 refills | Status: DC | PRN

## 2022-09-07 NOTE — Progress Notes (Signed)
Subjective:    Patient ID: Scott Ward, male    DOB: 07/04/1940, 82 y.o.   MRN: [id number]  Patient is here today with his son.  He continues to suffer from lumbar radiculopathy in his right leg.  He has tried and failed epidural steroid injections.  At the present time he is taking tramadol every other day.  He is not having any confusion or dizziness or sleepiness or constipation on the medication.  His son asked if we can increase the frequency to help manage the pain.  He is also not using Tylenol consistently.  We are avoiding NSAIDs due to chronic kidney disease.  The patient denies any chest pain or shortness of breath or dyspnea on exertion.  His fasting blood sugars have been consistently under 130.  Systolic blood pressure is typically in the 140. Past Medical History:  Diagnosis Date   Arthritis    "lower back" (03/02/2016)   CAP (community acquired pneumonia) 2018   Right Middle Lobe   Colon polyp    COPD (chronic obstructive pulmonary disease) (HCC) dx'd 03/02/2016   Dermatochalasis    Diabetes mellitus without complication (HCC)    Fatty liver    High blood pressure    High cholesterol    History of BPH    History of colon polyps    HOH (hard of hearing)    Nicotine abuse    Right carotid bruit    Past Surgical History:  Procedure Laterality Date   BLEPHAROPLASTY Bilateral    CATARACT EXTRACTION W/ INTRAOCULAR LENS  IMPLANT, BILATERAL     COLONOSCOPY     TRANSURETHRAL RESECTION OF PROSTATE N/A 02/06/2019   Procedure: TRANSURETHRAL RESECTION OF THE PROSTATE (TURP);  Surgeon: Bjorn Pippin, MD;  Location: [facility];  Service: Urology;  Laterality: N/A;   Current Outpatient Medications on File Prior to Visit  Medication Sig Dispense Refill   albuterol (VENTOLIN HFA) 108 (90 Base) MCG/ACT inhaler Inhale 2 puffs into the lungs every 6 (six) hours as needed for wheezing or shortness of breath. 18 g 1   aspirin 81 MG tablet Take 81 mg by mouth daily.        Budeson-Glycopyrrol-Formoterol (BREZTRI AEROSPHERE) 160-9-4.8 MCG/ACT AERO Inhale 2 puffs into the lungs 2 (two) times daily. 10.7 g 11   finasteride (PROSCAR) 5 MG tablet TAKE ONE TABLET BY MOUTH DAILY 90 tablet 3   finasteride (PROSCAR) 5 MG tablet Take 1 tablet (5 mg total) by mouth daily. 90 tablet 1   lisinopril (ZESTRIL) 40 MG tablet TAKE 1 TABLET BY MOUTH DAILY; **MUST CALL MD FOR APPOINTMENT 90 tablet 1   simvastatin (ZOCOR) 20 MG tablet Take 1 tablet (20 mg total) by mouth at bedtime. 90 tablet 3   sitaGLIPtin (JANUVIA) 100 MG tablet Take 1 tablet (100 mg total) by mouth daily. 90 tablet 1   traMADol (ULTRAM) 50 MG tablet TAKE 1 TABLET BY MOUTH EVERY 8 HOURS AS NEEDED 30 tablet 0   furosemide (LASIX) 40 MG tablet TAKE ONE TABLET BY MOUTH DAILY AS NEEDED FOR EDEMA (Patient not taking: Reported on 09/07/2022) 90 tablet 0   potassium chloride SA (KLOR-CON M20) 20 MEQ tablet Take 1 tablet (20 mEq total) by mouth daily. (Patient not taking: Reported on 09/07/2022) 90 tablet 1   predniSONE (DELTASONE) 20 MG tablet Take 2 tablets (40 mg total) by mouth daily with breakfast. (Patient not taking: Reported on 09/07/2022) 14 tablet 0   TRELEGY ELLIPTA 100-62.5-25 MCG/ACT AEPB Inhale 1 puff  into the lungs daily. (Patient not taking: Reported on 09/07/2022)     No current facility-administered medications on file prior to visit.   No Known Allergies  Social History   Socioeconomic History   Marital status: Divorced    Spouse name: Not on file   Number of children: 1   Years of education: 8   Highest education level: Not on file  Occupational History   Occupation: RETIRED  Tobacco Use   Smoking status: Every Day    Packs/day: 1.00    Years: 60.00    Additional pack years: 0.00    Total pack years: 60.00    Types: Cigarettes   Smokeless tobacco: Never  Vaping Use   Vaping Use: Never used  Substance and Sexual Activity   Alcohol use: Not Currently    Comment: 03/02/2016 "nothing since 1973"    Drug use: No   Sexual activity: Never  Other Topics Concern   Not on file  Social History Narrative   Patient is single with one child. (Lives alone) Son does most of the cooking and meal prep for him    Patient is left handed.Patient has an 8 th grade education.Patient drinks up to 3 cups daily.   Social Determinants of Health   Financial Resource Strain: Low Risk  (04/06/2022)   Overall Financial Resource Strain (CARDIA)    Difficulty of Paying Living Expenses: Not hard at all  Food Insecurity: No Food Insecurity (04/06/2022)   Hunger Vital Sign    Worried About Running Out of Food in the Last Year: Never true    Ran Out of Food in the Last Year: Never true  Transportation Needs: No Transportation Needs (04/06/2022)   PRAPARE - Administrator, Civil Service (Medical): No    Lack of Transportation (Non-Medical): No  Physical Activity: Inactive (08/03/2022)   Exercise Vital Sign    Days of Exercise per Week: 0 days    Minutes of Exercise per Session: 0 min  Stress: Stress Concern Present (08/03/2022)   [employer] of Occupational Health - Occupational Stress Questionnaire    Feeling of Stress : To some extent  Social Connections: Socially Isolated (08/03/2022)   Social Connection and Isolation Panel [NHANES]    Frequency of Communication with Friends and Family: Twice a week    Frequency of Social Gatherings with Friends and Family: Twice a week    Attends Religious Services: Never    Database administrator or Organizations: No    Attends Banker Meetings: Never    Marital Status: Divorced  Catering manager Violence: Not At Risk (04/06/2022)   Humiliation, Afraid, Rape, and Kick questionnaire    Fear of Current or Ex-Partner: No    Emotionally Abused: No    Physically Abused: No    Sexually Abused: No   Family History  Problem Relation Age of Onset   Heart attack Father    Heart attack Sister    Lung cancer Sister       Review of Systems  All other  systems reviewed and are negative.      Objective:   Physical Exam Vitals reviewed.  Constitutional:      General: He is not in acute distress.    Appearance: He is well-developed. He is not diaphoretic.  HENT:     Head: Normocephalic and atraumatic.     Right Ear: External ear normal.     Left Ear: External ear normal.     Nose:  Nose normal.     Mouth/Throat:     Pharynx: No oropharyngeal exudate.  Eyes:     General: No scleral icterus.       Right eye: No discharge.        Left eye: No discharge.     Conjunctiva/sclera: Conjunctivae normal.     Pupils: Pupils are equal, round, and reactive to light.  Neck:     Vascular: No JVD.  Cardiovascular:     Rate and Rhythm: Normal rate and regular rhythm.     Heart sounds: Normal heart sounds. No murmur heard.    No friction rub. No gallop.  Pulmonary:     Effort: Pulmonary effort is normal. No respiratory distress.     Breath sounds: No stridor. No wheezing, rhonchi or rales.  Chest:     Chest wall: No tenderness.  Abdominal:     General: Bowel sounds are normal. There is no distension.     Palpations: Abdomen is soft. There is no mass.     Tenderness: There is no abdominal tenderness. There is no guarding or rebound.  Musculoskeletal:        General: No tenderness or deformity. Normal range of motion.     Cervical back: Normal range of motion and neck supple.     Right lower leg: No edema.     Left lower leg: No edema.  Lymphadenopathy:     Cervical: No cervical adenopathy.  Skin:    General: Skin is warm.     Coloration: Skin is not pale.     Findings: No erythema or rash.  Neurological:     Mental Status: He is alert and oriented to person, place, and time.     Cranial Nerves: No cranial nerve deficit.     Motor: No abnormal muscle tone.     Coordination: Coordination normal.     Deep Tendon Reflexes: Reflexes are normal and symmetric.  Psychiatric:        Behavior: Behavior normal.        Thought Content:  Thought content normal.        Judgment: Judgment normal.           Assessment & Plan:  Diabetes mellitus without complication (HCC) - Plan: Microalbumin / creatinine urine ratio, HM Diabetes Foot Exam, Hemoglobin A1c, CBC with Differential/Platelet, Lipid panel, COMPLETE METABOLIC PANEL WITH GFR  Controlled type 2 diabetes mellitus without complication, without long-term current use of insulin (HCC) - Plan: Hemoglobin A1c, CBC with Differential/Platelet, Lipid panel, COMPLETE METABOLIC PANEL WITH GFR  Stage 3b chronic kidney disease (HCC) - Plan: Hemoglobin A1c, CBC with Differential/Platelet, Lipid panel, COMPLETE METABOLIC PANEL WITH GFR Blood sugars sound well-controlled.  Blood pressure sounds borderline.  I asked the son to check his blood pressure daily.  If consistently greater than 150 systolic I would add amlodipine.  He is doing well off Lasix and potassium.  I will check ABC CMP lipid panel and A1c today to monitor his diabetes.  Ideally I would like his hemoglobin A1c to be under 7.  I would like his LDL cholesterol to be under 100.  Will increase tramadol to 60 tablets/month.  I want him to take 1 tramadol tablet every morning to get it in his system to try to manage the pain better.  Also with him consistently taking 500 mg Tylenol twice daily on a scheduled basis.  Son will monitor for any constipation or confusion or delirium.  They deny any falls.  He  does have age-related cognitive decline but he denies any depression

## 2022-09-08 LAB — COMPLETE METABOLIC PANEL WITH GFR
AG Ratio: 1.8 (calc) (ref 1.0–2.5)
ALT: 6 U/L — ABNORMAL LOW (ref 9–46)
AST: 11 U/L (ref 10–35)
Albumin: 4.4 g/dL (ref 3.6–5.1)
Alkaline phosphatase (APISO): 78 U/L (ref 35–144)
BUN/Creatinine Ratio: 11 (calc) (ref 6–22)
BUN: 23 mg/dL (ref 7–25)
CO2: 27 mmol/L (ref 20–32)
Calcium: 9.3 mg/dL (ref 8.6–10.3)
Chloride: 108 mmol/L (ref 98–110)
Creat: 2.06 mg/dL — ABNORMAL HIGH (ref 0.70–1.22)
Globulin: 2.5 g/dL (calc) (ref 1.9–3.7)
Glucose, Bld: 113 mg/dL — ABNORMAL HIGH (ref 65–99)
Potassium: 4.4 mmol/L (ref 3.5–5.3)
Sodium: 142 mmol/L (ref 135–146)
Total Bilirubin: 0.3 mg/dL (ref 0.2–1.2)
Total Protein: 6.9 g/dL (ref 6.1–8.1)
eGFR: 31 mL/min/{1.73_m2} — ABNORMAL LOW (ref >=60)

## 2022-09-08 LAB — HEMOGLOBIN A1C
Hgb A1c MFr Bld: 6.2 % of total Hgb — ABNORMAL HIGH (ref <5.7)
Mean Plasma Glucose: 131 mg/dL
eAG (mmol/L): 7.3 mmol/L

## 2022-09-08 LAB — LIPID PANEL
Cholesterol: 135 mg/dL (ref <200)
HDL: 44 mg/dL (ref >=40)
LDL Cholesterol (Calc): 69 mg/dL (calc)
Non-HDL Cholesterol (Calc): 91 mg/dL (calc) (ref <130)
Total CHOL/HDL Ratio: 3.1 (calc) (ref <5.0)
Triglycerides: 135 mg/dL (ref <150)

## 2022-09-08 LAB — CBC WITH DIFFERENTIAL/PLATELET
Hemoglobin: 13.1 g/dL — ABNORMAL LOW (ref 13.2–17.1)
MCH: 31.6 pg (ref 27.0–33.0)
MCHC: 33.1 g/dL (ref 32.0–36.0)
MCV: 95.7 fL (ref 80.0–100.0)
Monocytes Relative: 8.4 %
Neutro Abs: 8199 cells/uL — ABNORMAL HIGH (ref 1500–7800)
RDW: 13.2 % (ref 11.0–15.0)
WBC: 10.3 10*3/uL (ref 3.8–10.8)

## 2022-09-08 LAB — MICROALBUMIN / CREATININE URINE RATIO
Creatinine, Urine: 127 mg/dL (ref 20–320)
Microalb Creat Ratio: 167 mg/g creat — ABNORMAL HIGH (ref <30)
Microalb, Ur: 21.2 mg/dL

## 2022-09-13 ENCOUNTER — Encounter: Payer: Self-pay | Admitting: Family Medicine

## 2022-09-13 ENCOUNTER — Other Ambulatory Visit: Payer: Self-pay | Admitting: Family Medicine

## 2022-09-13 MED ORDER — AMLODIPINE BESYLATE 10 MG PO TABS
10.0000 mg | ORAL_TABLET | Freq: Every day | ORAL | 3 refills | Status: DC
Start: 2022-09-13 — End: 2023-09-05

## 2022-09-13 MED ORDER — ATORVASTATIN CALCIUM 20 MG PO TABS: 20.0000 mg | ORAL_TABLET | Freq: Every day | ORAL | 3 refills | Status: DC

## 2022-09-23 NOTE — Addendum Note (Signed)
Addended by: Lynnea Ferrier T on: 09/23/2022 06:39 AM   Modules accepted: Level of Service

## 2022-09-27 ENCOUNTER — Ambulatory Visit: Payer: Self-pay | Admitting: Licensed Clinical Social Worker

## 2022-09-27 NOTE — Patient Outreach (Signed)
  Care Coordination   Follow Up Visit Note   09/27/2022 Name: Scott Ward MRN: [id number] DOB: 1940/03/28  Scott Ward is a 82 y.o. year old male who sees [name], Priscille Heidelberg, MD for primary care. I spoke with  Scott Ward / Scott Ward, son of client via phone today.  What matters to the patients health and wellness today?  Patient needs help with meals. He uses a walker to help him walk    Interventions  Spoke with Scott Ward, son of client, via phone today about status of client and about needs of client Discussed ambulation of client. Client uses a walker as needed to help him walk Discussed medication procurement for client. Client has prescribed medications Discussed pain issues of client. Client has some leg pain issues. Client cannot stand for long periods of time Discussed meal provision for client. Scott Ward said he ensures client has adequate meals to eat. Scott Ward obtains grocery items as needed for client Reviewed ADLs completions for client. Scott Ward said client is doing ADLs adequately Discussed client support with PCP, Dr. Lynnea Ferrier Encouraged client or Scott Ward to call LCSW as needed  for SW support for client at [phone].      SDOH assessments and interventions completed:  Yes  SDOH Interventions Today    Flowsheet Row Most Recent Value  SDOH Interventions   Physical Activity Interventions Other (Comments)  [mobility challenges]  Stress Interventions Other (Comment)  [client has stress in managing medical needs]        Care Coordination Interventions:  Yes, provided    Interventions Today    Flowsheet Row Most Recent Value  Chronic Disease   Chronic disease during today's visit Other  [spoke with Scott Ward, son of client, via phone about client needs]  General Interventions   General Interventions Discussed/Reviewed General Interventions Discussed, Community Resources  [reviewed program support]  Exercise Interventions   Exercise  Discussed/Reviewed Physical Activity  [mobility challenges]  Physical Activity Discussed/Reviewed Physical Activity Discussed  Education Interventions   Education Provided Provided Education  Provided Verbal Education On Community Resources  Mental Health Interventions   Mental Health Discussed/Reviewed Coping Strategies, Anxiety  [mood reviewed.  Has support from son with client meals.  Son helps client with transport. No mood issues noted]  Nutrition Interventions   Nutrition Discussed/Reviewed Nutrition Discussed  Pharmacy Interventions   Pharmacy Dicussed/Reviewed Pharmacy Topics Discussed  Safety Interventions   Safety Discussed/Reviewed Fall Risk       Follow up plan: Follow up call scheduled for 11/17/22 at 10:00 AM    Encounter Outcome:  Pt. Visit Completed {THN Tip this will not be part of the note when signed-REQUIRED REPORT FIELD DO NOT DELETE (Optional):[id number]  Kelton Pillar.Reve Crocket MSW, LCSW Licensed Visual merchandiser [facility] Care Management [phone]

## 2022-09-27 NOTE — Patient Instructions (Signed)
Visit Information  Thank you for taking time to visit with me today. Please don't hesitate to contact me if I can be of assistance to you.   Following are the goals we discussed today:   Goals Addressed             This Visit's Progress    Patient needs help with meals. He uses walker to help him walk       Interventions  Spoke with Scott Ward, son of client, via phone today about status of client and about needs of client Discussed ambulation of client. Client uses a walker as needed to help him walk Discussed medication procurement for client. Client has prescribed medications Discussed pain issues of client. Client has some leg pain issues. Client cannot stand for long periods of time Discussed meal provision for client. Scott Ward said he ensures client has adequate meals to eat. Scott Ward obtains grocery items as needed for client Reviewed ADLs completions for client. Scott Ward said client is doing ADLs adequately Discussed client support with PCP, Dr. Lynnea Ferrier Encouraged client or Scott Ward to call LCSW as needed  for SW support for client at [phone].            Our next appointment is by telephone on 11/17/22 at 10:00 AM    Please call the care guide team at [phone] if you need to cancel or reschedule your appointment.   If you are experiencing a Mental Health or Behavioral Health Crisis or need someone to talk to, please go to [facility] [address] [place] [phone])   The patient/ Scott Ward, son,  verbalized understanding of instructions, educational materials, and care plan provided today and DECLINED offer to receive copy of patient instructions, educational materials, and care plan.   The patient / Scott Ward, son, has been provided with contact information for the care management team and has been advised to call with any health related questions or concerns.   Scott Ward.Scott Ward MSW, LCSW Licensed  Visual merchandiser [facility] Care Management [phone]

## 2022-09-28 DIAGNOSIS — H02003 Unspecified entropion of right eye, unspecified eyelid: Secondary | ICD-10-CM | POA: Diagnosis not present

## 2022-10-21 ENCOUNTER — Other Ambulatory Visit: Payer: Self-pay | Admitting: Family Medicine

## 2022-10-21 DIAGNOSIS — N401 Enlarged prostate with lower urinary tract symptoms: Secondary | ICD-10-CM

## 2022-10-22 NOTE — Telephone Encounter (Signed)
Requested medication (s) are due for refill today: yes  Requested medication (s) are on the active medication list: yes  Last refill:  09/07/22  Future visit scheduled: yes  Notes to clinic:  Unable to refill per protocol, cannot delegate.      Requested Prescriptions  Pending Prescriptions Disp Refills   traMADol (ULTRAM) 50 MG tablet [Pharmacy Med Name: TRAMADOL HCL 50 MG TABLET] 60 tablet     Sig: TAKE 1 TABLET BY MOUTH EVERY 8 HOURS AS NEEDED     Not Delegated - Analgesics:  Opioid Agonists Failed - 10/21/2022  2:07 PM      Failed - This refill cannot be delegated      Failed - Urine Drug Screen completed in last 360 days      Failed - Valid encounter within last 3 months    Recent Outpatient Visits           1 year ago Controlled type 2 diabetes mellitus without complication, without long-term current use of insulin (HCC)   [facility] [name], Priscille Heidelberg, MD   1 year ago Controlled type 2 diabetes mellitus without complication, without long-term current use of insulin (HCC)   [facility] [name], Priscille Heidelberg, MD   2 years ago Controlled type 2 diabetes mellitus without complication, without long-term current use of insulin (HCC)   [facility] [name], Priscille Heidelberg, MD   2 years ago Controlled type 2 diabetes mellitus without complication, without long-term current use of insulin (HCC)   [facility] [name], Priscille Heidelberg, MD   3 years ago Benign essential HTN   [facility] [name], Priscille Heidelberg, MD       Future Appointments             In 4 months [name], Priscille Heidelberg, MD [facility]            Signed Prescriptions Disp Refills   finasteride (PROSCAR) 5 MG tablet 90 tablet 0    Sig: TAKE 1 TABLET BY MOUTH DAILY     Urology: 5-alpha Reductase Inhibitors Failed - 10/21/2022  2:07 PM      Failed - PSA in normal range and within 360 days    PSA  Date Value Ref  Range Status  10/26/2018 0.6 < OR = 4.0 ng/mL Final    Comment:    The total PSA value from this assay system is  standardized against the WHO standard. The test  result will be approximately 20% lower when compared  to the equimolar-standardized total PSA (Beckman  Coulter). Comparison of serial PSA results should be  interpreted with this fact in mind. . This test was performed using the Siemens  chemiluminescent method. Values obtained from  different assay methods cannot be used interchangeably. PSA levels, regardless of value, should not be interpreted as absolute evidence of the presence or absence of disease.          Failed - Valid encounter within last 12 months    Recent Outpatient Visits           1 year ago Controlled type 2 diabetes mellitus without complication, without long-term current use of insulin (HCC)   [facility] [name], Priscille Heidelberg, MD   1 year ago Controlled type 2 diabetes mellitus without complication, without long-term current use of insulin (HCC)   [facility] [name], Priscille Heidelberg, MD   2 years ago  Controlled type 2 diabetes mellitus without complication, without long-term current use of insulin (HCC)   [facility] [name], Priscille Heidelberg, MD   2 years ago Controlled type 2 diabetes mellitus without complication, without long-term current use of insulin (HCC)   [facility] [name], Priscille Heidelberg, MD   3 years ago Benign essential HTN   [facility] [name], Priscille Heidelberg, MD       Future Appointments             In 4 months [name], Priscille Heidelberg, MD [facility]             sitaGLIPtin (JANUVIA) 100 MG tablet 90 tablet 0    Sig: TAKE 1 TABLET BY MOUTH DAILY     Endocrinology:  Diabetes - DPP-4 Inhibitors Failed - 10/21/2022  2:07 PM      Failed - Cr in normal range and within 360 days    Creat  Date Value Ref Range Status  09/07/2022 2.06 (H)  0.70 - 1.22 mg/dL Final   Creatinine, Urine  Date Value Ref Range Status  09/07/2022 127 20 - 320 mg/dL Final         Failed - Valid encounter within last 6 months    Recent Outpatient Visits           1 year ago Controlled type 2 diabetes mellitus without complication, without long-term current use of insulin (HCC)   [facility] [name], Priscille Heidelberg, MD   1 year ago Controlled type 2 diabetes mellitus without complication, without long-term current use of insulin (HCC)   [facility] [name], Priscille Heidelberg, MD   2 years ago Controlled type 2 diabetes mellitus without complication, without long-term current use of insulin (HCC)   [facility] [name], Priscille Heidelberg, MD   2 years ago Controlled type 2 diabetes mellitus without complication, without long-term current use of insulin (HCC)   [facility] [name], Priscille Heidelberg, MD   3 years ago Benign essential HTN   [facility] [name], Priscille Heidelberg, MD       Future Appointments             In 4 months [name], Priscille Heidelberg, MD [facility]            Passed - HBA1C is between 0 and 7.9 and within 180 days    Hgb A1c MFr Bld  Date Value Ref Range Status  09/07/2022 6.2 (H) <5.7 % of total Hgb Final    Comment:    For someone without known diabetes, a hemoglobin  A1c value between 5.7% and 6.4% is consistent with prediabetes and should be confirmed with a  follow-up test. . For someone with known diabetes, a value <7% indicates that their diabetes is well controlled. A1c targets should be individualized based on duration of diabetes, age, comorbid conditions, and other considerations. . This assay result is consistent with an increased risk of diabetes. . Currently, no consensus exists regarding use of hemoglobin A1c for diagnosis of diabetes for children. Marland Kitchen

## 2022-10-22 NOTE — Telephone Encounter (Signed)
Last OV 09/07/22 within protocol.  Requested Prescriptions  Pending Prescriptions Disp Refills   finasteride (PROSCAR) 5 MG tablet [Pharmacy Med Name: FINASTERIDE 5 MG TABLET] 90 tablet 0    Sig: TAKE 1 TABLET BY MOUTH DAILY     Urology: 5-alpha Reductase Inhibitors Failed - 10/21/2022  2:07 PM      Failed - PSA in normal range and within 360 days    PSA  Date Value Ref Range Status  10/26/2018 0.6 < OR = 4.0 ng/mL Final    Comment:    The total PSA value from this assay system is  standardized against the WHO standard. The test  result will be approximately 20% lower when compared  to the equimolar-standardized total PSA (Beckman  Coulter). Comparison of serial PSA results should be  interpreted with this fact in mind. . This test was performed using the Siemens  chemiluminescent method. Values obtained from  different assay methods cannot be used interchangeably. PSA levels, regardless of value, should not be interpreted as absolute evidence of the presence or absence of disease.          Failed - Valid encounter within last 12 months    Recent Outpatient Visits           1 year ago Controlled type 2 diabetes mellitus without complication, without long-term current use of insulin (HCC)   [facility] [name], Priscille Heidelberg, MD   1 year ago Controlled type 2 diabetes mellitus without complication, without long-term current use of insulin (HCC)   [facility] [name], Priscille Heidelberg, MD   2 years ago Controlled type 2 diabetes mellitus without complication, without long-term current use of insulin (HCC)   [facility] [name], Priscille Heidelberg, MD   2 years ago Controlled type 2 diabetes mellitus without complication, without long-term current use of insulin (HCC)   [facility] [name], Priscille Heidelberg, MD   3 years ago Benign essential HTN   [facility] [name], Priscille Heidelberg, MD       Future Appointments              In 4 months [name], Priscille Heidelberg, MD [facility]             sitaGLIPtin (JANUVIA) 100 MG tablet [Pharmacy Med Name: JANUVIA 100 MG TABLET] 90 tablet 0    Sig: TAKE 1 TABLET BY MOUTH DAILY     Endocrinology:  Diabetes - DPP-4 Inhibitors Failed - 10/21/2022  2:07 PM      Failed - Cr in normal range and within 360 days    Creat  Date Value Ref Range Status  09/07/2022 2.06 (H) 0.70 - 1.22 mg/dL Final   Creatinine, Urine  Date Value Ref Range Status  09/07/2022 127 20 - 320 mg/dL Final         Failed - Valid encounter within last 6 months    Recent Outpatient Visits           1 year ago Controlled type 2 diabetes mellitus without complication, without long-term current use of insulin (HCC)   [facility] Donita Brooks, MD   1 year ago Controlled type 2 diabetes mellitus without complication, without long-term current use of insulin (HCC)   [facility] Donita Brooks, MD   2 years ago Controlled type 2 diabetes mellitus without complication, without long-term current use of insulin (HCC)   [facility] [name], Priscille Heidelberg, MD   2 years ago Controlled type 2 diabetes mellitus without complication, without long-term current use of insulin (HCC)   [facility] [name], Priscille Heidelberg, MD   3 years ago Benign essential HTN   [facility] [name], Priscille Heidelberg, MD       Future Appointments             In 4 months [name], Priscille Heidelberg, MD [place] [facility]            Passed - HBA1C is between 0 and 7.9 and within 180 days    Hgb A1c MFr Bld  Date Value Ref Range Status  09/07/2022 6.2 (H) <5.7 % of total Hgb Final    Comment:    For someone without known diabetes, a hemoglobin  A1c value between 5.7% and 6.4% is consistent with prediabetes and should be confirmed with a  follow-up test. . For someone with known diabetes, a value  <7% indicates that their diabetes is well controlled. A1c targets should be individualized based on duration of diabetes, age, comorbid conditions, and other considerations. . This assay result is consistent with an increased risk of diabetes. . Currently, no consensus exists regarding use of hemoglobin A1c for diagnosis of diabetes for children. .           traMADol (ULTRAM) 50 MG tablet [Pharmacy Med Name: TRAMADOL HCL 50 MG TABLET] 60 tablet     Sig: TAKE 1 TABLET BY MOUTH EVERY 8 HOURS AS NEEDED     Not Delegated - Analgesics:  Opioid Agonists Failed - 10/21/2022  2:07 PM      Failed - This refill cannot be delegated      Failed - Urine Drug Screen completed in last 360 days      Failed - Valid encounter within last 3 months    Recent Outpatient Visits           1 year ago Controlled type 2 diabetes mellitus without complication, without long-term current use of insulin (HCC)   [facility] [name], Priscille Heidelberg, MD   1 year ago Controlled type 2 diabetes mellitus without complication, without long-term current use of insulin (HCC)   [facility] Donita Brooks, MD   2 years ago Controlled type 2 diabetes mellitus without complication, without long-term current use of insulin (HCC)   [facility] Donita Brooks, MD   2 years ago Controlled type 2 diabetes mellitus without complication, without long-term current use of insulin (HCC)   [facility] [name], Priscille Heidelberg, MD   3 years ago Benign essential HTN   [facility] [name], Priscille Heidelberg, MD       Future Appointments             In 4 months [name], Priscille Heidelberg, MD [facility]

## 2022-10-27 DIAGNOSIS — H02052 Trichiasis without entropian right lower eyelid: Secondary | ICD-10-CM | POA: Diagnosis not present

## 2022-10-27 DIAGNOSIS — H02032 Senile entropion of right lower eyelid: Secondary | ICD-10-CM | POA: Diagnosis not present

## 2022-10-27 DIAGNOSIS — H04222 Epiphora due to insufficient drainage, left lacrimal gland: Secondary | ICD-10-CM | POA: Diagnosis not present

## 2022-10-27 DIAGNOSIS — H0279 Other degenerative disorders of eyelid and periocular area: Secondary | ICD-10-CM | POA: Diagnosis not present

## 2022-10-27 DIAGNOSIS — H02022 Mechanical entropion of right lower eyelid: Secondary | ICD-10-CM | POA: Diagnosis not present

## 2022-10-27 DIAGNOSIS — H04221 Epiphora due to insufficient drainage, right lacrimal gland: Secondary | ICD-10-CM | POA: Diagnosis not present

## 2022-10-27 DIAGNOSIS — E7800 Pure hypercholesterolemia, unspecified: Secondary | ICD-10-CM | POA: Insufficient documentation

## 2022-10-27 DIAGNOSIS — I509 Heart failure, unspecified: Secondary | ICD-10-CM | POA: Insufficient documentation

## 2022-10-27 DIAGNOSIS — H02532 Eyelid retraction right lower eyelid: Secondary | ICD-10-CM | POA: Diagnosis not present

## 2022-10-27 DIAGNOSIS — H02012 Cicatricial entropion of right lower eyelid: Secondary | ICD-10-CM | POA: Diagnosis not present

## 2022-10-27 DIAGNOSIS — H04223 Epiphora due to insufficient drainage, bilateral lacrimal glands: Secondary | ICD-10-CM | POA: Diagnosis not present

## 2022-11-17 ENCOUNTER — Ambulatory Visit: Payer: Self-pay | Admitting: Licensed Clinical Social Worker

## 2022-11-17 NOTE — Patient Instructions (Signed)
Visit Information  Thank you for taking time to visit with me today. Please don't hesitate to contact me if I can be of assistance to you.   Following are the goals we discussed today:   Goals Addressed             This Visit's Progress    Patient uses cane to help him walk. Needs help with meals and transport.       Interventions: Spoke with [name], son of client, about client needs Discussed program support Discussed ambulation of client. Client has a walker and a cane for help with ambulation Discussed medication procurement of client Discussed pain issues of client Discussed sleeping issues of client Reviewed appetite of client. Reviewed vision issues of client. Client did have eye exam recently and is receiving medical care for vision issue. Reviewed client use of inhaler. [name] said client uses inhaler daily to help client with breathing needs Encouraged client to use program resources as needed [name] for phone call with LCSW today Encouraged client or Kharon Schraer to call LCSW as needed for SW support for client at [phone].        Our next appointment is by telephone on 01/05/23 at 10:00 AM   Please call the care guide team at [phone] if you need to cancel or reschedule your appointment.   If you are experiencing a Mental Health or Behavioral Health Crisis or need someone to talk to, please go to [facility] [address] [place] [phone])   The patient / Scott Ward, ,son, verbalized understanding of instructions, educational materials, and care plan provided today and DECLINED offer to receive copy of patient instructions, educational materials, and care plan.   The patient / Scott Ward, son, has been provided with contact information for the care management team and has been advised to call with any health related questions or concerns.   Kelton Pillar.Edward Guthmiller MSW, LCSW Licensed Information systems manager [facility] Care Management [phone]

## 2022-11-17 NOTE — Patient Outreach (Signed)
Care Coordination   Follow Up Visit Note   11/17/2022 Name: Scott Ward MRN: [id number] DOB: 1940/11/08  Scott Ward is a 82 y.o. year old male who sees [name], Priscille Heidelberg, MD for primary care. I spoke with  Scott Ward / [name], son of client via phone today.  What matters to the patients health and wellness today?  Patient uses a cane to help him walk. Client needs help with meals and with transport     Goals Addressed             This Visit's Progress    Patient uses cane to help him walk. Needs help with meals and transport.       Interventions: Spoke with [name], son of client, about client needs Discussed program support Discussed ambulation of client. Client has a walker and a cane for help with ambulation Discussed medication procurement of client Discussed pain issues of client Discussed sleeping issues of client Reviewed appetite of client. Reviewed vision issues of client. Client did have eye exam recently and is receiving medical care for vision issue. Reviewed client use of inhaler. [name] said client uses inhaler daily to help client with breathing needs Encouraged client to use program resources as needed [name] for phone call with LCSW today Encouraged client or Kerry Barklow to call LCSW as needed for SW support for client at [phone].        SDOH assessments and interventions completed:  Yes  SDOH Interventions Today    Flowsheet Row Most Recent Value  SDOH Interventions   Physical Activity Interventions Other (Comments)  [has some walking challenges. uses a cane to help him walk]  Stress Interventions Other (Comment)  [has stress related to mobility issues. has some stress in managing medical needs]        Care Coordination Interventions:  Yes, provided   Interventions Today    Flowsheet Row Most Recent Value  Chronic Disease   Chronic disease during today's visit Other  [spoke with [name], son of client,  about client needs]  General Interventions   General Interventions Discussed/Reviewed General Interventions Discussed, Community Resources  Exercise Interventions   Exercise Discussed/Reviewed Physical Activity  [client uses a cane to help him walk]  Physical Activity Discussed/Reviewed Physical Activity Reviewed  Education Interventions   Education Provided Provided Education  Provided Verbal Education On Community Resources  Mental Health Interventions   Mental Health Discussed/Reviewed Coping Strategies  [no mood issues noted]  Nutrition Interventions   Nutrition Discussed/Reviewed Nutrition Discussed  Pharmacy Interventions   Pharmacy Dicussed/Reviewed Pharmacy Topics Discussed  Safety Interventions   Safety Discussed/Reviewed Fall Risk        Follow up plan: Follow up call scheduled for 01/05/23 at 10:00 AM    Encounter Outcome:  Patient Visit Completed   Kelton Pillar.Verlaine Embry MSW, LCSW Licensed Visual merchandiser [facility] Care Management [phone]

## 2022-11-25 ENCOUNTER — Other Ambulatory Visit: Payer: Self-pay | Admitting: Family Medicine

## 2022-11-25 NOTE — Telephone Encounter (Signed)
Requested Prescriptions  Pending Prescriptions Disp Refills   lisinopril (ZESTRIL) 40 MG tablet [Pharmacy Med Name: LISINOPRIL 40 MG TABLET] 90 tablet 1    Sig: TAKE 1 TABLET BY MOUTH DAILY; **MUST CALL MD FOR APPOINTMENT FOR FUTURE REFILLS     Cardiovascular:  ACE Inhibitors Failed - 11/25/2022 12:16 PM      Failed - Cr in normal range and within 180 days    Creat  Date Value Ref Range Status  09/07/2022 2.06 (H) 0.70 - 1.22 mg/dL Final   Creatinine, Urine  Date Value Ref Range Status  09/07/2022 127 20 - 320 mg/dL Final         Failed - Last BP in normal range    BP Readings from Last 1 Encounters:  09/07/22 (!) 152/86         Failed - Valid encounter within last 6 months    Recent Outpatient Visits           1 year ago Controlled type 2 diabetes mellitus without complication, without long-term current use of insulin (HCC)   [facility] Donita Brooks, MD   2 years ago Controlled type 2 diabetes mellitus without complication, without long-term current use of insulin (HCC)   [facility] [name], Priscille Heidelberg, MD   2 years ago Controlled type 2 diabetes mellitus without complication, without long-term current use of insulin (HCC)   [facility] [name], Priscille Heidelberg, MD   3 years ago Controlled type 2 diabetes mellitus without complication, without long-term current use of insulin (HCC)   [facility] [name], Priscille Heidelberg, MD   3 years ago Benign essential HTN   [facility] [name], Priscille Heidelberg, MD       Future Appointments             In 3 months [name], Priscille Heidelberg, MD [place] [facility]            Passed - K in normal range and within 180 days    Potassium  Date Value Ref Range Status  09/07/2022 4.4 3.5 - 5.3 mmol/L Final         Passed - Patient is not pregnant

## 2022-11-26 ENCOUNTER — Other Ambulatory Visit: Payer: Self-pay | Admitting: Family Medicine

## 2022-11-26 ENCOUNTER — Other Ambulatory Visit: Payer: Self-pay

## 2022-11-26 DIAGNOSIS — J9601 Acute respiratory failure with hypoxia: Secondary | ICD-10-CM

## 2022-11-26 DIAGNOSIS — J441 Chronic obstructive pulmonary disease with (acute) exacerbation: Secondary | ICD-10-CM

## 2022-11-26 MED ORDER — ALBUTEROL SULFATE HFA 108 (90 BASE) MCG/ACT IN AERS
2.0000 | INHALATION_SPRAY | Freq: Four times a day (QID) | RESPIRATORY_TRACT | 1 refills | Status: AC | PRN
Start: 2022-11-26 — End: ?

## 2022-11-26 NOTE — Telephone Encounter (Signed)
Requested medication (s) are due for refill today:yes  Requested medication (s) are on the active medication list:yes  Last refill:  last ordered 05/05/20 !8 g 1 RF  Future visit scheduled: yes  Notes to clinic:  last ordered 2 years ago   Requested Prescriptions  Pending Prescriptions Disp Refills   albuterol (VENTOLIN HFA) 108 (90 Base) MCG/ACT inhaler 18 g 1    Sig: Inhale 2 puffs into the lungs every 6 (six) hours as needed for wheezing or shortness of breath.     Pulmonology:  Beta Agonists 2 Failed - 11/26/2022  2:51 PM      Failed - Last BP in normal range    BP Readings from Last 1 Encounters:  09/07/22 (!) 152/86         Failed - Valid encounter within last 12 months    Recent Outpatient Visits           1 year ago Controlled type 2 diabetes mellitus without complication, without long-term current use of insulin (HCC)   [facility] [name], Priscille Heidelberg, MD   2 years ago Controlled type 2 diabetes mellitus without complication, without long-term current use of insulin (HCC)   [facility] Donita Brooks, MD   2 years ago Controlled type 2 diabetes mellitus without complication, without long-term current use of insulin (HCC)   [facility] Donita Brooks, MD   3 years ago Controlled type 2 diabetes mellitus without complication, without long-term current use of insulin (HCC)   [facility] [name], Priscille Heidelberg, MD   3 years ago Benign essential HTN   [facility] [name], Priscille Heidelberg, MD       Future Appointments             In 3 months [name], Priscille Heidelberg, MD [facility]            Passed - Last Heart Rate in normal range    Pulse Readings from Last 1 Encounters:  09/07/22 83

## 2022-11-26 NOTE — Telephone Encounter (Signed)
LOV 09/07/2022 (cpe)  Pharmacy requesting 90 day supply.   Please see previous notes in thread.

## 2022-11-26 NOTE — Telephone Encounter (Signed)
Prescription Request  11/26/2022  LOV: 09/07/2022  What is the name of the medication or equipment?       Have you contacted your pharmacy to request a refill? Yes   Which pharmacy would you like this sent to?  [facility] [id number] [place], [place] - [address] [facility] [address] [place] [address] Phone: [phone] Fax: [phone]    Patient notified that their request is being sent to the clinical staff for review and that they should receive a response within 2 business days.   Please advise pharmacist.

## 2022-11-28 ENCOUNTER — Other Ambulatory Visit: Payer: Self-pay | Admitting: Family Medicine

## 2022-11-29 NOTE — Telephone Encounter (Signed)
Requested medication (s) are due for refill today - yes  Requested medication (s) are on the active medication list -yes  Future visit scheduled -yes  Last refill: 10/25/22 #60  Notes to clinic: non delegated Rx  Requested Prescriptions  Pending Prescriptions Disp Refills   traMADol (ULTRAM) 50 MG tablet [Pharmacy Med Name: traMADol HCL 50MG  TABLET] 60 tablet     Sig: TAKE 1 TABLET BY MOUTH EVERY 8 HOURS AS NEEDED     Not Delegated - Analgesics:  Opioid Agonists Failed - 11/28/2022  1:46 PM      Failed - This refill cannot be delegated      Failed - Urine Drug Screen completed in last 360 days      Failed - Valid encounter within last 3 months    Recent Outpatient Visits           1 year ago Controlled type 2 diabetes mellitus without complication, without long-term current use of insulin (HCC)   [facility] [name], Priscille Heidelberg, MD   2 years ago Controlled type 2 diabetes mellitus without complication, without long-term current use of insulin (HCC)   [facility] [name], Priscille Heidelberg, MD   2 years ago Controlled type 2 diabetes mellitus without complication, without long-term current use of insulin (HCC)   [facility] [name], Priscille Heidelberg, MD   3 years ago Controlled type 2 diabetes mellitus without complication, without long-term current use of insulin (HCC)   [facility] [name], Priscille Heidelberg, MD   3 years ago Benign essential HTN   [facility] Donita Brooks, MD       Future Appointments             In 3 months [name], Priscille Heidelberg, MD [facility]               Requested Prescriptions  Pending Prescriptions Disp Refills   traMADol (ULTRAM) 50 MG tablet [Pharmacy Med Name: traMADol HCL 50MG  TABLET] 60 tablet     Sig: TAKE 1 TABLET BY MOUTH EVERY 8 HOURS AS NEEDED     Not Delegated - Analgesics:  Opioid Agonists Failed - 11/28/2022  1:46 PM      Failed -  This refill cannot be delegated      Failed - Urine Drug Screen completed in last 360 days      Failed - Valid encounter within last 3 months    Recent Outpatient Visits           1 year ago Controlled type 2 diabetes mellitus without complication, without long-term current use of insulin (HCC)   [facility] [name], Priscille Heidelberg, MD   2 years ago Controlled type 2 diabetes mellitus without complication, without long-term current use of insulin (HCC)   [facility] [name], Priscille Heidelberg, MD   2 years ago Controlled type 2 diabetes mellitus without complication, without long-term current use of insulin (HCC)   [facility] Donita Brooks, MD   3 years ago Controlled type 2 diabetes mellitus without complication, without long-term current use of insulin (HCC)   [facility] [name], Priscille Heidelberg, MD   3 years ago Benign essential HTN   [facility] [name], Priscille Heidelberg, MD       Future Appointments             In 3 months Tanya Nones Priscille Heidelberg,  MD [facility]

## 2022-12-01 DIAGNOSIS — H6123 Impacted cerumen, bilateral: Secondary | ICD-10-CM | POA: Diagnosis not present

## 2022-12-02 DIAGNOSIS — T161XXA Foreign body in right ear, initial encounter: Secondary | ICD-10-CM | POA: Diagnosis not present

## 2022-12-03 NOTE — Telephone Encounter (Signed)
Pharmacy sent script to follow up on refill requested for traMADol (ULTRAM) 50 MG tablet   LOV 09/07/22 (cpe)  Pharmacy:  [facility] [id number] - [place], [place] - [address] [place], [place] [address] Phone: [phone]  Fax: [phone]   Please advise pharmacist.

## 2022-12-27 DIAGNOSIS — H02052 Trichiasis without entropian right lower eyelid: Secondary | ICD-10-CM | POA: Diagnosis not present

## 2022-12-27 DIAGNOSIS — H02532 Eyelid retraction right lower eyelid: Secondary | ICD-10-CM | POA: Diagnosis not present

## 2022-12-27 DIAGNOSIS — H02022 Mechanical entropion of right lower eyelid: Secondary | ICD-10-CM | POA: Diagnosis not present

## 2022-12-27 DIAGNOSIS — H02032 Senile entropion of right lower eyelid: Secondary | ICD-10-CM | POA: Diagnosis not present

## 2022-12-27 DIAGNOSIS — H04221 Epiphora due to insufficient drainage, right lacrimal gland: Secondary | ICD-10-CM | POA: Diagnosis not present

## 2022-12-27 DIAGNOSIS — H04223 Epiphora due to insufficient drainage, bilateral lacrimal glands: Secondary | ICD-10-CM | POA: Diagnosis not present

## 2022-12-27 DIAGNOSIS — H04222 Epiphora due to insufficient drainage, left lacrimal gland: Secondary | ICD-10-CM | POA: Diagnosis not present

## 2022-12-27 DIAGNOSIS — H02012 Cicatricial entropion of right lower eyelid: Secondary | ICD-10-CM | POA: Diagnosis not present

## 2022-12-27 DIAGNOSIS — H02411 Mechanical ptosis of right eyelid: Secondary | ICD-10-CM | POA: Diagnosis not present

## 2023-01-05 ENCOUNTER — Ambulatory Visit: Payer: Self-pay | Admitting: Licensed Clinical Social Worker

## 2023-01-05 NOTE — Patient Instructions (Signed)
Visit Information  Thank you for taking time to visit with me today. Please don't hesitate to contact me if I can be of assistance to you.   Following are the goals we discussed today:   Goals Addressed             This Visit's Progress    Patient uses cane to help him walk. Needs help with meals and transport.       Interventions: Spoke with [name], son of client, via phone today about client status and needs Discussed program support with RN, LCSW, Pharmacist Discussed ambulation of client. Client is using a cane to help him walk Discussed medication procurement of client Discussed pain issues of client Discussed sleeping issues of client Reviewed appetite of client. Reviewed vision issues of client. Client had a eye procedure recently. He is attending appointments as scheduled related to care for his eyes Discussed edema issues of client. [name] said client is no longer using diurectic. He said client has occasional swelling in his legs [name] for phone call with LCSW today Encouraged client or Khaleb Broz to call LCSW as needed for SW support for client at [phone].        Our next appointment is by telephone on 02/07/23 at 1:30 PM   Please call the care guide team at [phone] if you need to cancel or reschedule your appointment.   If you are experiencing a Mental Health or Behavioral Health Crisis or need someone to talk to, please go to [facility] [address], [place] [phone])   The patient / Scott Ward, son of client, verbalized understanding of instructions, educational materials, and care plan provided today and DECLINED offer to receive copy of patient instructions, educational materials, and care plan.   The patient / Scott Ward, son of client, has been provided with contact information for the care management team and has been advised to call with any health related questions or concerns.    Kelton Pillar.Bricelyn Freestone MSW, LCSW Licensed Visual merchandiser [facility] Care Management [phone]

## 2023-01-05 NOTE — Patient Outreach (Signed)
  Care Coordination   Follow Up Visit Note   01/05/2023 Name: Scott Ward MRN: [id number] DOB: [birth year]  Scott Ward is a 82 y.o. year old male who sees [name], Priscille Heidelberg, MD for primary care. I spoke with  Scott Ward / Scott Ward, son of client via phone today.  What matters to the patients health and wellness today? Patient uses cane to help him walk. Needs help with meals and transport.     Goals Addressed             This Visit's Progress    Patient uses cane to help him walk. Needs help with meals and transport.       Interventions: Spoke with Scott Ward, son of client, via phone today about client status and needs Discussed program support with RN, LCSW, Pharmacist Discussed ambulation of client. Client is using a cane to help him walk Discussed medication procurement of client Discussed pain issues of client Discussed sleeping issues of client Reviewed appetite of client. Reviewed vision issues of client. Client had a eye procedure recently. He is attending appointments as scheduled related to care for his eyes Discussed edema issues of client. [name] said client is no longer using diurectic. He said client has occasional swelling in his legs [name] for phone call with LCSW today Encouraged client or [name] to call LCSW as needed for SW support for client at [phone].        SDOH assessments and interventions completed:  Yes  SDOH Interventions Today    Flowsheet Row Most Recent Value  SDOH Interventions   Physical Activity Interventions Other (Comments)  [client uses a cane to help  him walk]  Stress Interventions Other (Comment)  [client has some stress in managing medical needs]        Care Coordination Interventions:  Yes, provided  Interventions Today    Flowsheet Row Most Recent Value  Chronic Disease   Chronic disease during today's visit Other  [spoke with Scott Ward, son of client, about client needs]  General  Interventions   General Interventions Discussed/Reviewed General Interventions Discussed, Community Resources  Education Interventions   Education Provided Provided Education  Provided Verbal Education On [employer]  Mental Health Interventions   Mental Health Discussed/Reviewed Coping Strategies  [no mood issues noted]  Nutrition Interventions   Nutrition Discussed/Reviewed Nutrition Discussed  [name] is doing some cooking for himself]  Pharmacy Interventions   Pharmacy Dicussed/Reviewed Pharmacy Topics Discussed  Safety Interventions   Safety Discussed/Reviewed Fall Risk        Follow up plan: Follow up call scheduled for 02/07/23 at 1:30 PM     Encounter Outcome:  Patient Visit Completed   Kelton Pillar.[name] MSW, LCSW Licensed Visual merchandiser [facility] Care Management [phone]

## 2023-01-25 ENCOUNTER — Other Ambulatory Visit: Payer: Self-pay | Admitting: Family Medicine

## 2023-02-07 ENCOUNTER — Ambulatory Visit: Payer: Self-pay | Admitting: Licensed Clinical Social Worker

## 2023-02-07 NOTE — Patient Instructions (Signed)
Visit Information  Thank you for taking time to visit with me today. Please don't hesitate to contact me if I can be of assistance to you.   Following are the goals we discussed today:   Goals Addressed             This Visit's Progress    Patient uses cane to help him walk. Needs help with meals and transport.       Interventions: Spoke with [name], son of client, via phone today about client status and needs. [name] said client was doing a little better in doing some activities Discussed client ambulation. [name] said client uses a cane to help client walk Discussed program support with RN, LCSW, Pharmacist Discussed medication procurement of client Discussed pain issues of client Discussed sleeping issues of client. [name] said client sleeps about 12 hours daily Reviewed appetite of client. No problem with client appetite Reviewed vision issues of client.  Discussed client use of inhaler. [name] said client uses inhaler 2 times daily and that this helps with breathing of client Discussed meal procurement for client . [name] said he helps client obtain grocery items needed. [name] helps client with meal procurement  Discussed client support with PCP, Dr. Lynnea Ferrier. Thanked [name] for phone call with LCSW today Encouraged client or Vraj Tease to call LCSW as needed for SW support for client at [phone].          Our next appointment is by telephone on 04/04/23 at 9:00 AM   Please call the care guide team at [phone] if you need to cancel or reschedule your appointment.   If you are experiencing a Mental Health or Behavioral Health Crisis or need someone to talk to, please go to [facility] [address], [place] [phone])   The patient/ Scott Ward, son of patient,  verbalized understanding of instructions, educational materials, and care plan provided today and DECLINED offer to receive copy of patient  instructions, educational materials, and care plan.   The patient / [name], son of patient, has been provided with contact information for the care management team and has been advised to call with any health related questions or concerns.   Kelton Pillar.Abdel Effinger MSW, LCSW Licensed Visual merchandiser [facility] Care Management [phone]

## 2023-02-07 NOTE — Patient Outreach (Signed)
  Care Coordination   Follow Up Visit Note   02/07/2023 Name: Scott Ward MRN: [id number] DOB: 10-11-1940  Scott Ward is a 82 y.o. year old male who sees [name], Priscille Heidelberg, MD for primary care. I spoke with  Scott Ward / Scott Ward, son of client, via phone today.  What matters to the patients health and wellness today?  Patient uses cane to help him walk. Needs help with meals and transport.     Goals Addressed             This Visit's Progress    Patient uses cane to help him walk. Needs help with meals and transport.       Interventions: Spoke with Scott Ward, son of client, via phone today about client status and needs. Scott Ward said client was doing a little better in doing some activities Discussed client ambulation. Scott Ward said client uses a cane to help client walk Discussed program support with RN, LCSW, Pharmacist Discussed medication procurement of client Discussed pain issues of client Discussed sleeping issues of client. Scott Ward said client sleeps about 12 hours daily Reviewed appetite of client. No problem with client appetite Reviewed vision issues of client.  Discussed client use of inhaler. Scott Ward said client uses inhaler 2 times daily and that this helps with breathing of client Discussed meal procurement for client . Scott Ward said he helps client obtain grocery items needed. Scott Ward helps client with meal procurement  Discussed client support with PCP, Dr. Lynnea Ferrier. Thanked Scott Ward for phone call with LCSW today Encouraged client or Scott Ward to call LCSW as needed for SW support for client at [phone].          SDOH assessments and interventions completed:  Yes  SDOH Interventions Today    Flowsheet Row Most Recent Value  SDOH Interventions   Physical Activity Interventions Other (Comments)  [client uses a cane to help him walk. client has to take some rest breaks when walking]  Stress Interventions Other (Comment)  [client has stress  in managing medical needs. He needs help with meals and with transport. He has to take periodic rest breaks with activities]        Care Coordination Interventions:  Yes, provided   Interventions Today    Flowsheet Row Most Recent Value  Chronic Disease   Chronic disease during today's visit Other  [spoke with Scott Ward, son of client, about client needs]  General Interventions   General Interventions Discussed/Reviewed General Interventions Discussed, Community Resources  Education Interventions   Education Provided Provided Education  Provided Verbal Education On [employer]  Mental Health Interventions   Mental Health Discussed/Reviewed Coping Strategies  [no mood issues noted]  Nutrition Interventions   Nutrition Discussed/Reviewed Nutrition Discussed  [son of client helps client with grocery shopping and meal procurement for client]  Pharmacy Interventions   Pharmacy Dicussed/Reviewed Pharmacy Topics Discussed  Safety Interventions   Safety Discussed/Reviewed Fall Risk        Follow up plan: Follow up call scheduled for 04/04/23 at 9:00 AM    Encounter Outcome:  Patient Visit Completed   Kelton Pillar.Geeta Dworkin MSW, LCSW Licensed Visual merchandiser [facility] Care Management [phone]

## 2023-02-16 ENCOUNTER — Other Ambulatory Visit: Payer: Self-pay | Admitting: Family Medicine

## 2023-03-07 ENCOUNTER — Ambulatory Visit: Payer: Self-pay | Admitting: Licensed Clinical Social Worker

## 2023-03-07 NOTE — Patient Outreach (Signed)
  Care Coordination   Follow Up Visit Note   03/07/2023 Name: Scott Ward MRN: [id number] DOB: 19-Nov-1940  Scott Ward is a 83 y.o. year old male who sees [name], Butler DASEN, MD for primary care. I spoke with  Scott Ward / Scott Ward, son and contact for client, via phone today.  What matters to the patients health and wellness today? Patient uses cane or walker, as needed to help him walk. Needs help with meals and transport.     Goals Addressed             This Visit's Progress    Patient uses cane or walker, as needed to help him walk. Needs help with meals and transport.       Interventions: Spoke with Scott Ward, son of client, via phone today about client status and needs. Scott was seeking information about HCPOA and how to complete HCPOA for client. LCSW informed Scott that he could contact the Chaplain's Department at [facility] and talk with Chaplain's Department representative about setting up appointment for client and Scott to complete HCPOA for client.  LCSW gave Scott the contact information for Chaplain's Department at [facility]. Scott Ward plans to call Chaplain's Department at [facility] to discuss HCPOA completion for client Discussed client appointments with PCP Dr. Butler DASEN Burr Client continues to use either a cane or a walker as needed to help client walk Reviewed vision issues of client.  Thanked Scott Ward for phone call with LCSW today Encouraged client or Scott Ward to call LCSW as needed for SW support for client at [phone].          SDOH assessments and interventions completed:  Yes  SDOH Interventions Today    Flowsheet Row Most Recent Value  SDOH Interventions   Physical Activity Interventions Other (Comments)  [uses either a cane or a walker to help him walk as needed]  Stress Interventions Other (Comment)  [stress in managing daily activities. stress in managing medical needs]        Care Coordination Interventions:   Yes, provided    Interventions Today    Flowsheet Row Most Recent Value  Chronic Disease   Chronic disease during today's visit Other  [spoke with Scott Ward, son of client, about client needs]  General Interventions   General Interventions Discussed/Reviewed General Interventions Discussed, Community Resources  Education Interventions   Education Provided Provided Education  Provided Verbal Education On [employer] and completion of HCPOA for client]  Mental Health Interventions   Mental Health Discussed/Reviewed Coping Strategies  [no mood issues noted]  Safety Interventions   Safety Discussed/Reviewed Fall Risk       Follow up plan: LCSW to call Scott Ward, son of client, as scheduled, to assess client needs at that time   Encounter Outcome:  Patient Visit Completed   Scott Ward.Kani Chauvin MSW, LCSW Licensed Visual Merchandiser [facility] Care Management [phone]

## 2023-03-07 NOTE — Patient Instructions (Signed)
 Visit Information  Thank you for taking time to visit with me today. Please don't hesitate to contact me if I can be of assistance to you.   Following are the goals we discussed today:   Goals Addressed             This Visit's Progress    Patient uses cane or walker, as needed to help him walk. Needs help with meals and transport.       Interventions: Spoke with Alm Sans, son of client, via phone today about client status and needs. Alm was seeking information about HCPOA and how to complete HCPOA for client. LCSW informed Alm that he could contact the Chaplain's Department at [facility] and talk with Chaplain's Department representative about setting up appointment for client and Alm to complete HCPOA for client.  LCSW gave Alm the contact information for Chaplain's Department at [facility]. Alm Sans plans to call Chaplain's Department at [facility] to discuss HCPOA completion for client Discussed client appointments with PCP Dr. Butler [name] Client continues to use either a cane or a walker as needed to help client walk Reviewed vision issues of client.  Thanked Alm Sans for phone call with LCSW today Encouraged client or [name] to call LCSW as needed for SW support for client at [phone].         LCSW to call Alm Sans, son of client, as scheduled, to assess client needs at that time.    Please call the care guide team at [phone] if you need to cancel or reschedule your appointment.   If you are experiencing a Mental Health or Behavioral Health Crisis or need someone to talk to, please go to [facility] [address] [place] [phone])   The patient / Scott Ward, son of client, verbalized understanding of instructions, educational materials, and care plan provided today and DECLINED offer to receive copy of patient instructions, educational materials, and care plan.   The patient / Scott Ward, son of client, has been provided with contact information for the care management team and has been advised to call with any health related questions or concerns.   Scott Ward.Carilyn Woolston MSW, LCSW Licensed Visual Merchandiser [facility] Care Management [phone]

## 2023-03-14 ENCOUNTER — Encounter: Payer: Self-pay | Admitting: Family Medicine

## 2023-03-14 ENCOUNTER — Ambulatory Visit (INDEPENDENT_AMBULATORY_CARE_PROVIDER_SITE_OTHER): Payer: Self-pay | Admitting: Family Medicine

## 2023-03-14 VITALS — BP 138/82 | HR 81 | Temp 97.9°F | Ht 71.0 in | Wt 220.0 lb

## 2023-03-14 DIAGNOSIS — N1832 Chronic kidney disease, stage 3b: Secondary | ICD-10-CM

## 2023-03-14 DIAGNOSIS — Z23 Encounter for immunization: Secondary | ICD-10-CM

## 2023-03-14 DIAGNOSIS — E1169 Type 2 diabetes mellitus with other specified complication: Secondary | ICD-10-CM | POA: Diagnosis not present

## 2023-03-14 DIAGNOSIS — E119 Type 2 diabetes mellitus without complications: Secondary | ICD-10-CM

## 2023-03-14 MED ORDER — FUROSEMIDE 40 MG PO TABS: 40.0000 mg | ORAL_TABLET | Freq: Every day | ORAL | 0 refills | Status: DC | PRN

## 2023-03-14 NOTE — Addendum Note (Signed)
 Addended by: Venia Carbon K on: 03/14/2023 10:01 AM   Modules accepted: Orders

## 2023-03-14 NOTE — Progress Notes (Signed)
 Wt Readings from Last 3 Encounters:  03/14/23 220 lb (99.8 kg)  09/07/22 202 lb (91.6 kg)  04/06/22 206 lb (93.4 kg)     Subjective:    Patient ID: Scott Ward, male    DOB: April 25, 1940, 83 y.o.   MRN: [id number]  Patient is here today with his son.  Patient is not pain but less pain erratic pattern but the tramadol  seems to be helping with this.  He takes 1 tramadol  a day.  His son states that he has been able to abandon the walker and use the cane because he is been able to get up and do more.  However his weight has gone up substantially.  After his last lab work we reduced his Lasix  to as needed.  This appears to be swelling and fluid retention.  He has +1 edema in both legs to the knee.  His son thinks he is also drinking more [facility] and sugar containing beverages.  Therefore some of the weight gain could simply be weight gain.  The patient denies any chest pain or shortness of breath orthopnea.  He denies any sensation of smothering when he lays down at night.  There are no crackles in his lungs today.  There is no wheezing. Past Medical History:  Diagnosis Date   Arthritis    lower back (03/02/2016)   CAP (community acquired pneumonia) 2018   Right Middle Lobe   Colon polyp    COPD (chronic obstructive pulmonary disease) (HCC) dx'd 03/02/2016   Dermatochalasis    Diabetes mellitus without complication (HCC)    Fatty liver    High blood pressure    High cholesterol    History of BPH    History of colon polyps    HOH (hard of hearing)    Nicotine  abuse    Right carotid bruit    Past Surgical History:  Procedure Laterality Date   BLEPHAROPLASTY Bilateral    CATARACT EXTRACTION W/ INTRAOCULAR LENS  IMPLANT, BILATERAL     COLONOSCOPY     TRANSURETHRAL RESECTION OF PROSTATE N/A 02/06/2019   Procedure: TRANSURETHRAL RESECTION OF THE PROSTATE (TURP);  Surgeon: Watt Norleen, MD;  Location: [facility];  Service: Urology;  Laterality: N/A;   Current Outpatient Medications on File Prior  to Visit  Medication Sig Dispense Refill   albuterol  (VENTOLIN  HFA) 108 (90 Base) MCG/ACT inhaler Inhale 2 puffs into the lungs every 6 (six) hours as needed for wheezing or shortness of breath. 18 g 1   amLODipine  (NORVASC ) 10 MG tablet Take 1 tablet (10 mg total) by mouth daily. 90 tablet 3   aspirin  81 MG tablet Take 81 mg by mouth daily.       atorvastatin  (LIPITOR) 20 MG tablet Take 1 tablet (20 mg total) by mouth daily. Stop simvastatin  90 tablet 3   Budeson-Glycopyrrol-Formoterol (BREZTRI  AEROSPHERE) 160-9-4.8 MCG/ACT AERO Inhale 2 puffs into the lungs 2 (two) times daily. 10.7 g 11   finasteride  (PROSCAR ) 5 MG tablet TAKE 1 TABLET BY MOUTH DAILY 90 tablet 0   lisinopril  (ZESTRIL ) 40 MG tablet TAKE 1 TABLET BY MOUTH DAILY; **MUST CALL MD FOR APPOINTMENT FOR FUTURE REFILLS 90 tablet 1   potassium chloride  SA (KLOR-CON  M20) 20 MEQ tablet Take 1 tablet (20 mEq total) by mouth daily. 90 tablet 1   sitaGLIPtin  (JANUVIA ) 100 MG tablet TAKE 1 TABLET BY MOUTH DAILY 30 tablet 2   traMADol  (ULTRAM ) 50 MG tablet TAKE 1 TABLET BY MOUTH EVERY 8 HOURS AS  NEEDED 60 tablet 0   No current facility-administered medications on file prior to visit.   No Known Allergies  Social History   Socioeconomic History   Marital status: Divorced    Spouse name: Not on file   Number of children: 1   Years of education: 8   Highest education level: Not on file  Occupational History   Occupation: RETIRED  Tobacco Use   Smoking status: Every Day    Current packs/day: 1.00    Average packs/day: 1 pack/day for 60.0 years (60.0 ttl pk-yrs)    Types: Cigarettes   Smokeless tobacco: Never  Vaping Use   Vaping status: Never Used  Substance and Sexual Activity   Alcohol use: Not Currently    Comment: 03/02/2016 nothing since 1973   Drug use: No   Sexual activity: Never  Other Topics Concern   Not on file  Social History Narrative   Patient is single with one child. (Lives alone) Son does most of the cooking  and meal prep for him    Patient is left handed.Patient has an 8 th grade education.Patient drinks up to 3 cups daily.   Social Drivers of Corporate Investment Banker Strain: Low Risk  (04/06/2022)   Overall Financial Resource Strain (CARDIA)    Difficulty of Paying Living Expenses: Not hard at all  Food Insecurity: No Food Insecurity (04/06/2022)   Hunger Vital Sign    Worried About Running Out of Food in the Last Year: Never true    Ran Out of Food in the Last Year: Never true  Transportation Needs: No Transportation Needs (04/06/2022)   PRAPARE - Administrator, Civil Service (Medical): No    Lack of Transportation (Non-Medical): No  Physical Activity: Inactive (03/07/2023)   Exercise Vital Sign    Days of Exercise per Week: 0 days    Minutes of Exercise per Session: 0 min  Stress: Stress Concern Present (03/07/2023)   [employer] of Occupational Health - Occupational Stress Questionnaire    Feeling of Stress : To some extent  Social Connections: Socially Isolated (08/03/2022)   Social Connection and Isolation Panel [NHANES]    Frequency of Communication with Friends and Family: Twice a week    Frequency of Social Gatherings with Friends and Family: Twice a week    Attends Religious Services: Never    Database Administrator or Organizations: No    Attends Banker Meetings: Never    Marital Status: Divorced  Catering Manager Violence: Not At Risk (04/06/2022)   Humiliation, Afraid, Rape, and Kick questionnaire    Fear of Current or Ex-Partner: No    Emotionally Abused: No    Physically Abused: No    Sexually Abused: No   Family History  Problem Relation Age of Onset   Heart attack Father    Heart attack Sister    Lung cancer Sister       Review of Systems  All other systems reviewed and are negative.      Objective:   Physical Exam Vitals reviewed.  Constitutional:      General: He is not in acute distress.    Appearance: He is  well-developed. He is not diaphoretic.  HENT:     Head: Normocephalic and atraumatic.     Right Ear: External ear normal.     Left Ear: External ear normal.     Nose: Nose normal.     Mouth/Throat:     Pharynx: No  oropharyngeal exudate.  Eyes:     General: No scleral icterus.       Right eye: No discharge.        Left eye: No discharge.     Conjunctiva/sclera: Conjunctivae normal.     Pupils: Pupils are equal, round, and reactive to light.  Neck:     Vascular: No JVD.  Cardiovascular:     Rate and Rhythm: Normal rate and regular rhythm.     Heart sounds: Normal heart sounds. No murmur heard.    No friction rub. No gallop.  Pulmonary:     Effort: Pulmonary effort is normal. No respiratory distress.     Breath sounds: No stridor. No wheezing, rhonchi or rales.  Chest:     Chest wall: No tenderness.  Abdominal:     General: Bowel sounds are normal. There is no distension.     Palpations: Abdomen is soft. There is no mass.     Tenderness: There is no abdominal tenderness. There is no guarding or rebound.  Musculoskeletal:        General: No tenderness or deformity. Normal range of motion.     Cervical back: Normal range of motion and neck supple.     Right lower leg: No edema.     Left lower leg: No edema.  Lymphadenopathy:     Cervical: No cervical adenopathy.  Skin:    General: Skin is warm.     Coloration: Skin is not pale.     Findings: No erythema or rash.  Neurological:     Mental Status: He is alert and oriented to person, place, and time.     Cranial Nerves: No cranial nerve deficit.     Motor: No abnormal muscle tone.     Coordination: Coordination normal.     Deep Tendon Reflexes: Reflexes are normal and symmetric.  Psychiatric:        Behavior: Behavior normal.        Thought Content: Thought content normal.        Judgment: Judgment normal.           Assessment & Plan:  Controlled type 2 diabetes mellitus without complication, without long-term  current use of insulin  (HCC) - Plan: Hemoglobin A1c, CBC with Differential/Platelet, COMPLETE METABOLIC PANEL WITH GFR, Lipid panel  Stage 3b chronic kidney disease (HCC) - Plan: Hemoglobin A1c, CBC with Differential/Platelet, COMPLETE METABOLIC PANEL WITH GFR, Lipid panel Blood pressure is excellent.  The patient does have +1 edema.  I recommended checking his weight daily.  If he gains more than 3 pounds in a day, administer Lasix .  Check CBC CMP lipid panel and A1c.  I would be happy with his A1c less than 7.  Monitor his chronic kidney disease by checking his creatinine.  Avoid NSAIDs.  Continue to use tramadol  for pain.  Patient received Capvaxive.

## 2023-03-15 LAB — COMPLETE METABOLIC PANEL WITH GFR
AG Ratio: 1.7 (calc) (ref 1.0–2.5)
ALT: 9 U/L (ref 9–46)
AST: 9 U/L — ABNORMAL LOW (ref 10–35)
Albumin: 4.3 g/dL (ref 3.6–5.1)
Alkaline phosphatase (APISO): 85 U/L (ref 35–144)
BUN/Creatinine Ratio: 9 (calc) (ref 6–22)
BUN: 19 mg/dL (ref 7–25)
CO2: 22 mmol/L (ref 20–32)
Calcium: 8.9 mg/dL (ref 8.6–10.3)
Chloride: 112 mmol/L — ABNORMAL HIGH (ref 98–110)
Creat: 2.2 mg/dL — ABNORMAL HIGH (ref 0.70–1.22)
Globulin: 2.5 g/dL (ref 1.9–3.7)
Glucose, Bld: 110 mg/dL — ABNORMAL HIGH (ref 65–99)
Potassium: 4.5 mmol/L (ref 3.5–5.3)
Sodium: 144 mmol/L (ref 135–146)
Total Bilirubin: 0.3 mg/dL (ref 0.2–1.2)
Total Protein: 6.8 g/dL (ref 6.1–8.1)
eGFR: 29 mL/min/{1.73_m2} — ABNORMAL LOW (ref >=60)

## 2023-03-15 LAB — CBC WITH DIFFERENTIAL/PLATELET
Absolute Lymphocytes: 1360 {cells}/uL (ref 850–3900)
Absolute Monocytes: 850 {cells}/uL (ref 200–950)
Basophils Absolute: 40 {cells}/uL (ref 0–200)
Basophils Relative: 0.4 %
Eosinophils Absolute: 270 {cells}/uL (ref 15–500)
Eosinophils Relative: 2.7 %
HCT: 39.6 % (ref 38.5–50.0)
Hemoglobin: 13.4 g/dL (ref 13.2–17.1)
MCH: 32.1 pg (ref 27.0–33.0)
MCHC: 33.8 g/dL (ref 32.0–36.0)
MCV: 94.7 fL (ref 80.0–100.0)
MPV: 10.9 fL (ref 7.5–12.5)
Monocytes Relative: 8.5 %
Neutro Abs: 7480 {cells}/uL (ref 1500–7800)
Neutrophils Relative %: 74.8 %
Platelets: 239 10*3/uL (ref 140–400)
RBC: 4.18 10*6/uL — ABNORMAL LOW (ref 4.20–5.80)
RDW: 14 % (ref 11.0–15.0)
Total Lymphocyte: 13.6 %
WBC: 10 10*3/uL (ref 3.8–10.8)

## 2023-03-15 LAB — HEMOGLOBIN A1C
Hgb A1c MFr Bld: 6.8 %{Hb} — ABNORMAL HIGH (ref <5.7)
Mean Plasma Glucose: 148 mg/dL
eAG (mmol/L): 8.2 mmol/L

## 2023-03-15 LAB — LIPID PANEL
Cholesterol: 131 mg/dL (ref <200)
HDL: 38 mg/dL — ABNORMAL LOW (ref >=40)
LDL Cholesterol (Calc): 70 mg/dL
Non-HDL Cholesterol (Calc): 93 mg/dL (ref <130)
Total CHOL/HDL Ratio: 3.4 (calc) (ref <5.0)
Triglycerides: 143 mg/dL (ref <150)

## 2023-03-17 DIAGNOSIS — H02413 Mechanical ptosis of bilateral eyelids: Secondary | ICD-10-CM | POA: Diagnosis not present

## 2023-03-17 DIAGNOSIS — H5340 Unspecified visual field defects: Secondary | ICD-10-CM | POA: Diagnosis not present

## 2023-04-01 ENCOUNTER — Other Ambulatory Visit: Payer: Self-pay | Admitting: Family Medicine

## 2023-04-04 ENCOUNTER — Ambulatory Visit: Payer: Self-pay | Admitting: Licensed Clinical Social Worker

## 2023-04-04 NOTE — Patient Instructions (Signed)
Visit Information  Thank you for taking time to visit with me today. Please don't hesitate to contact me if I can be of assistance to you.   Following are the goals we discussed today:   Goals Addressed             This Visit's Progress    Patient needs help with meals and transport       Interventions:  LCSW spoke via phone today with [name], son of client, about client needs [name] said client was doing fairly well at this time. Client recently had his annual physical with Dr. Tanya Nones. [name] said he [name]) is watching blood pressure readings for client to monitor client blood pressure Discussed client ambulation . [name] said client is walking now without a cane or a walker. Client is not using a device at present to help him walk Client has medications and is taking medications as prescribed. [name], son of client, said that he ensures client has adequate meals and food provisions Dixon Luczak transports client to and from client appointments as needed LCSW encouraged Jamarco Zaldivar to call LCSW as needed for SW support for client at [phone].  [name] was appreciative of call from LCSW today        Our next appointment is by telephone on 06/06/23 at 10:00 AM   Please call the care guide team at [phone] if you need to cancel or reschedule your appointment.   If you are experiencing a Mental Health or Behavioral Health Crisis or need someone to talk to, please go to [facility] [address] [place] [phone])   The patient Scott Ward, son verbalized understanding of instructions, educational materials, and care plan provided today and DECLINED offer to receive copy of patient instructions, educational materials, and care plan.   The patient / Tomothy Eddins, son, has been provided with contact information for the care management team and has been advised to call with any health related questions or concerns.    [name]   MSW, LCSW [place] Licensed Clinical Social Worker Direct Dial:  [phone] Fax:  [phone] Website:  [email or web address]

## 2023-04-04 NOTE — Patient Outreach (Signed)
  Care Coordination   Follow Up Visit Note   04/04/2023 Name: Scott Ward MRN: [id number] DOB: 1940-11-12  Scott Ward is a 83 y.o. year old male who sees [name], Priscille Heidelberg, MD for primary care. I spoke with  Scott Ward / Scott Ward, son of client, via phone today about client needs.  What matters to the patients health and wellness today?  Patient needs help with meals and transport support    Goals Addressed             This Visit's Progress    Patient needs help with meals and transport       Interventions:  LCSW spoke via phone today with Scott Ward, son of client, about client needs Scott Ward said client was doing fairly well at this time. Client recently had his annual physical with Dr. Tanya Nones. Scott Ward said he Scott Ward) is watching blood pressure readings for client to monitor client blood pressure Discussed client ambulation . Scott Ward said client is walking now without a cane or a walker. Client is not using a device at present to help him walk Client has medications and is taking medications as prescribed. Scott Ward, son of client, said that he ensures client has adequate meals and food provisions Amiri Riechers transports client to and from client appointments as needed LCSW encouraged Rueben Kassim to call LCSW as needed for SW support for client at [phone].  Scott Ward was appreciative of call from LCSW today        SDOH assessments and interventions completed:  Yes  SDOH Interventions Today    Flowsheet Row Most Recent Value  SDOH Interventions   Physical Activity Interventions Other (Comments)  [client has some mobilitiy challenges]  Stress Interventions Other (Comment)  [needs help with meals,  needs help with transport needs]        Care Coordination Interventions:  Yes, provided   Interventions Today    Flowsheet Row Most Recent Value  Chronic Disease   Chronic disease during today's visit Other  [spoke with Scott Ward, son of client, about client needs]  General  Interventions   General Interventions Discussed/Reviewed General Interventions Discussed, Community Resources  Education Interventions   Education Provided Provided Education  Provided Engineer, petroleum On [employer]  Mental Health Interventions   Mental Health Discussed/Reviewed Coping Strategies  [no mood issues noted]  Nutrition Interventions   Nutrition Discussed/Reviewed Nutrition Discussed  Pharmacy Interventions   Pharmacy Dicussed/Reviewed Pharmacy Topics Discussed  Safety Interventions   Safety Discussed/Reviewed Fall Risk        Follow up plan: Follow up call scheduled for 06/06/23 at 10:00 AM    Encounter Outcome:  Patient Visit Completed    [name]  MSW, LCSW [place] Licensed Clinical Social Worker Direct Dial:  [phone] Fax:  [phone] Website:  [email or web address]

## 2023-04-18 ENCOUNTER — Other Ambulatory Visit: Payer: Self-pay | Admitting: Family Medicine

## 2023-05-01 ENCOUNTER — Other Ambulatory Visit: Payer: Self-pay | Admitting: Family Medicine

## 2023-05-05 DIAGNOSIS — H02413 Mechanical ptosis of bilateral eyelids: Secondary | ICD-10-CM | POA: Diagnosis not present

## 2023-05-30 ENCOUNTER — Other Ambulatory Visit: Payer: Self-pay | Admitting: Family Medicine

## 2023-05-31 NOTE — Telephone Encounter (Signed)
 Requested Prescriptions  Pending Prescriptions Disp Refills   lisinopril (ZESTRIL) 40 MG tablet [Pharmacy Med Name: LISINOPRIL 40 MG TABLET] 90 tablet 0    Sig: TAKE 1 TABLET BY MOUTH DAILY **MUST CALL MD FOR APPOINTMENT     Cardiovascular:  ACE Inhibitors Failed - 05/31/2023  4:59 PM      Failed - Cr in normal range and within 180 days    Creat  Date Value Ref Range Status  03/14/2023 2.20 (H) 0.70 - 1.22 mg/dL Final   Creatinine, Urine  Date Value Ref Range Status  09/07/2022 127 20 - 320 mg/dL Final         Passed - K in normal range and within 180 days    Potassium  Date Value Ref Range Status  03/14/2023 4.5 3.5 - 5.3 mmol/L Final         Passed - Patient is not pregnant      Passed - Last BP in normal range    BP Readings from Last 1 Encounters:  03/14/23 138/82         Passed - Valid encounter within last 6 months    Recent Outpatient Visits           2 months ago Controlled type 2 diabetes mellitus without complication, without long-term current use of insulin (HCC)   [place] [facility] Donita Brooks, MD   8 months ago Diabetes mellitus without complication [facility])   [place] [facility] [name], Priscille Heidelberg, MD   1 year ago Right sided sciatica   [place] [facility] Donita Brooks, MD   1 year ago Stage 3b chronic kidney disease [facility])   [place] [facility] [name], Priscille Heidelberg, MD

## 2023-06-06 ENCOUNTER — Ambulatory Visit: Payer: Self-pay | Admitting: Licensed Clinical Social Worker

## 2023-06-06 ENCOUNTER — Encounter: Payer: Self-pay | Admitting: Licensed Clinical Social Worker

## 2023-06-06 NOTE — Patient Outreach (Signed)
 Complex Care Management   Visit Note  06/06/2023  Name:  Scott Ward MRN: [id number] DOB: October 07, 1940  Situation: Referral received for Complex Care Management related to COPD I obtained verbal consent from son of client, Scott Ward.  Visit completed with Scott Ward, son of client  on the phone  Background:   Past Medical History:  Diagnosis Date   Arthritis    "lower back" (03/02/2016)   CAP (community acquired pneumonia) 2018   Right Middle Lobe   Colon polyp    COPD (chronic obstructive pulmonary disease) (HCC) dx'd 03/02/2016   Dermatochalasis    Diabetes mellitus without complication (HCC)    Fatty liver    High blood pressure    High cholesterol    History of BPH    History of colon polyps    HOH (hard of hearing)    Nicotine abuse    Right carotid bruit     Assessment: Patient Reported Symptoms:  Cognitive   Needs help in remembering to take prescribed meds  Neurological      HEENT      Cardiovascular      Respiratory    Uses inhaler 2 times daily  Endocrine      Gastrointestinal      Genitourinary      Integumentary      Musculoskeletal  Fatigues occasionally . Needs to take rest breaks  Psychosocial  Risk of some social isolation      Vitals:  Normal ranges per information from son, Scott Ward  Medications Reviewed Today     Reviewed by Scott Blakes, LCSW (Social Worker) on 06/06/23 at 1005  Med List Status: <None>   Medication Order Taking? Sig Documenting Provider Last Dose Status Informant  albuterol (VENTOLIN HFA) 108 (90 Base) MCG/ACT inhaler [id number] No Inhale 2 puffs into the lungs every 6 (six) hours as needed for wheezing or shortness of breath. Scott Brooks, MD Taking Active   amLODipine (NORVASC) 10 MG tablet [id number] No Take 1 tablet (10 mg total) by mouth daily. Scott Brooks, MD Taking Active   aspirin 81 MG tablet [id number] No Take 81 mg by mouth daily.   [provider] Taking Active Family Member            Med Note Anne Hahn, Raelyn Mora Apr 30, 2019  8:11 AM)    atorvastatin (LIPITOR) 20 MG tablet [id number] No Take 1 tablet (20 mg total) by mouth daily. Stop simvastatin Scott Brooks, MD Taking Active   Budeson-Glycopyrrol-Formoterol (BREZTRI AEROSPHERE) 160-9-4.8 MCG/ACT AERO [id number] No Inhale 2 puffs into the lungs 2 (two) times daily. Scott Brooks, MD Taking Active   finasteride (PROSCAR) 5 MG tablet [id number] No TAKE 1 TABLET BY MOUTH DAILY Scott Brooks, MD Taking Active   furosemide (LASIX) 40 MG tablet [id number]  Take 1 tablet (40 mg total) by mouth daily as needed. Scott Brooks, MD  Active   JANUVIA 100 MG tablet [id number]  TAKE 1 TABLET BY MOUTH DAILY Scott Brooks, MD  Active   lisinopril (ZESTRIL) 40 MG tablet [id number]  TAKE 1 TABLET BY MOUTH DAILY **MUST CALL MD FOR APPOINTMENT Scott Brooks, MD  Active   potassium chloride SA (KLOR-CON M20) 20 MEQ tablet [id number] No Take 1 tablet (20 mEq total) by mouth daily. Scott Brooks, MD Taking Active   traMADol Janean Sark) 50 MG tablet [id number]  TAKE 1 TABLET BY MOUTH EVERY 8 HOURS AS NEEDED [name],  Priscille Heidelberg, MD  Active             Recommendation:   Client to continue to cooperate with son, Scott Ward, in addressing daily client needs with ADLs, meals, meds, transport help Client to attend scheduled medical appointments Client to take medications as prescribed Client to participate in activities for relaxation such as watching favorite sports events Client to socialize at least one time weekly with others (friends, family members)  Follow Up Plan:   Telephone follow-up in 1 month   [name]  MSW, [employer] [place] [employer] Licensed Clinical Social Worker Direct Dial:  [phone] Fax:  [phone] Website:  [email or web address]

## 2023-06-06 NOTE — Patient Instructions (Signed)
 Visit Information  Thank you for taking time to visit with me today. Please don't hesitate to contact me if I can be of assistance to you.   Following are the goals we discussed today:   Goals Addressed             This Visit's Progress    Patient uses cane or walker, as needed to help him walk. Needs help with meals and transport.       Interventions: Spoke with Scott Ward, son of client, via phone today about client status and needs. [name] said he helps client with meals and with transport needs [name] said client wears glasses to help with vision Discussed client medication procurement Client has a cane and a walker to use as needed to help with client walking Discussed pain management of client Discussed client support with PCP, Dr. Tanya Nones Discussed appetite of client. Discussed sleeping issues of client Discussed client use of inhaler. [name] said client uses inhaler 2 times daily Discussed relaxation techniques of client. Client likes to watch racing events on TV. Client likes to watch sports events on TV [name] said he is trying to monitor blood pressure readings for client Discussed client transport. Scott Ward transports client to and from client medical appointments Thanked [name] for phone call with LCSW today Encouraged client or [name] to call LCSW as needed for SW support for client at [phone]. Scott Ward was appreciative of LCSW phone call today          Our next appointment is by telephone on 07/05/23 at 2:30 PM   Please call the care guide team at [phone] if you need to cancel or reschedule your appointment.   If you are experiencing a Mental Health or Behavioral Health Crisis or need someone to talk to, please go to [facility] [address], [place] [phone])   The patient / Scott Ward, son, verbalized understanding of instructions, educational materials, and care plan provided today and  DECLINED offer to receive copy of patient instructions, educational materials, and care plan.   The patient/ Scott Ward, son,  has been provided with contact information for the care management team and has been advised to call with any health related questions or concerns.    [name]  MSW, LCSW /[facility] Licensed Clinical Social Worker Direct Dial:  [phone] Fax:  [phone] Website:  [email or web address]

## 2023-07-05 ENCOUNTER — Other Ambulatory Visit: Payer: Self-pay | Admitting: Licensed Clinical Social Worker

## 2023-07-18 ENCOUNTER — Other Ambulatory Visit: Payer: Self-pay | Admitting: Family Medicine

## 2023-07-18 DIAGNOSIS — N138 Other obstructive and reflux uropathy: Secondary | ICD-10-CM

## 2023-07-20 NOTE — Telephone Encounter (Signed)
 OV 03/14/23 Requested Prescriptions  Pending Prescriptions Disp Refills   finasteride  (PROSCAR ) 5 MG tablet [Pharmacy Med Name: FINASTERIDE  5 MG TABLET] 90 tablet 0    Sig: TAKE 1 TABLET BY MOUTH DAILY     Urology: 5-alpha Reductase Inhibitors Failed - 07/20/2023 12:55 PM      Failed - PSA in normal range and within 360 days    PSA  Date Value Ref Range Status  10/26/2018 0.6 < OR = 4.0 ng/mL Final    Comment:    The total PSA value from this assay system is  standardized against the WHO standard. The test  result will be approximately 20% lower when compared  to the equimolar-standardized total PSA (Beckman  Coulter). Comparison of serial PSA results should be  interpreted with this fact in mind. . This test was performed using the Siemens  chemiluminescent method. Values obtained from  different assay methods cannot be used interchangeably. PSA levels, regardless of value, should not be interpreted as absolute evidence of the presence or absence of disease.          Passed - Valid encounter within last 12 months    Recent Outpatient Visits           4 months ago Controlled type 2 diabetes mellitus without complication, without long-term current use of insulin  [facility])   [place] [facility] Austine Lefort, MD   10 months ago Diabetes mellitus without complication [facility])   [place] [facility] [name], Cisco Crest, MD   1 year ago Right sided sciatica   [place] [facility] Austine Lefort, MD   1 year ago Stage 3b chronic kidney disease [facility])   [place] [facility] [name], Cisco Crest, MD

## 2023-07-26 ENCOUNTER — Other Ambulatory Visit: Payer: Self-pay | Admitting: Family Medicine

## 2023-07-27 ENCOUNTER — Other Ambulatory Visit: Payer: Self-pay

## 2023-07-27 MED ORDER — SITAGLIPTIN PHOSPHATE 100 MG PO TABS
100.0000 mg | ORAL_TABLET | Freq: Every day | ORAL | 2 refills | Status: DC
Start: 2023-07-27 — End: 2023-10-24

## 2023-07-28 MED ORDER — TRAMADOL HCL 50 MG PO TABS: 50.0000 mg | ORAL_TABLET | Freq: Three times a day (TID) | ORAL | 3 refills | Status: DC | PRN

## 2023-08-01 ENCOUNTER — Other Ambulatory Visit: Payer: Self-pay | Admitting: Family Medicine

## 2023-08-22 ENCOUNTER — Encounter: Payer: Self-pay | Admitting: Family Medicine

## 2023-08-22 ENCOUNTER — Other Ambulatory Visit: Payer: Self-pay | Admitting: Family Medicine

## 2023-08-22 ENCOUNTER — Ambulatory Visit (INDEPENDENT_AMBULATORY_CARE_PROVIDER_SITE_OTHER): Admitting: Family Medicine

## 2023-08-22 VITALS — BP 137/77 | HR 79 | Temp 97.5°F | Ht 71.0 in | Wt 215.2 lb

## 2023-08-22 DIAGNOSIS — E7800 Pure hypercholesterolemia, unspecified: Secondary | ICD-10-CM | POA: Diagnosis not present

## 2023-08-22 DIAGNOSIS — E119 Type 2 diabetes mellitus without complications: Secondary | ICD-10-CM

## 2023-08-22 DIAGNOSIS — Z7984 Long term (current) use of oral hypoglycemic drugs: Secondary | ICD-10-CM | POA: Diagnosis not present

## 2023-08-22 DIAGNOSIS — N1832 Chronic kidney disease, stage 3b: Secondary | ICD-10-CM

## 2023-08-22 DIAGNOSIS — J441 Chronic obstructive pulmonary disease with (acute) exacerbation: Secondary | ICD-10-CM | POA: Diagnosis not present

## 2023-08-22 DIAGNOSIS — E1169 Type 2 diabetes mellitus with other specified complication: Secondary | ICD-10-CM

## 2023-08-22 DIAGNOSIS — I10 Essential (primary) hypertension: Secondary | ICD-10-CM | POA: Diagnosis not present

## 2023-08-22 MED ORDER — BREZTRI AEROSPHERE 160-9-4.8 MCG/ACT IN AERO: 2.0000 | INHALATION_SPRAY | Freq: Two times a day (BID) | RESPIRATORY_TRACT | 11 refills | Status: AC

## 2023-08-22 NOTE — Progress Notes (Signed)
 Wt Readings from Last 3 Encounters:  03/14/23 220 lb (99.8 kg)  09/07/22 202 lb (91.6 kg)  04/06/22 206 lb (93.4 kg)     Subjective:    Patient ID: Scott Ward, male    DOB: [birth year], 83 y.o.   MRN: [id number]  Patient is here today for his regular checkup.  He denies any chest pain.  He denies any shortness of breath.  He denies any orthopnea paroxysmal nocturnal dyspnea.  His son states that he has to give him a Lasix  pill once every 2 or 3 weeks at best.  He has trace pitting edema in both legs today.  His blood pressure is well-controlled.  Patient does complain of weakness in both legs as well as pain in both legs stemming from lumbar radiculopathy.  However this is chronic.  Patient denies any recent falls.  Son tries to keep the patient active.  He has been able to graduate from a walker to a cane.  Past Medical History:  Diagnosis Date   Arthritis    lower back (03/02/2016)   CAP (community acquired pneumonia) 2018   Right Middle Lobe   Colon polyp    COPD (chronic obstructive pulmonary disease) (HCC) dx'd 03/02/2016   Dermatochalasis    Diabetes mellitus without complication (HCC)    Fatty liver    High blood pressure    High cholesterol    History of BPH    History of colon polyps    HOH (hard of hearing)    Nicotine  abuse    Right carotid bruit    Past Surgical History:  Procedure Laterality Date   BLEPHAROPLASTY Bilateral    CATARACT EXTRACTION W/ INTRAOCULAR LENS  IMPLANT, BILATERAL     COLONOSCOPY     TRANSURETHRAL RESECTION OF PROSTATE N/A 02/06/2019   Procedure: TRANSURETHRAL RESECTION OF THE PROSTATE (TURP);  Surgeon: Watt Norleen, MD;  Location: [facility];  Service: Urology;  Laterality: N/A;   Current Outpatient Medications on File Prior to Visit  Medication Sig Dispense Refill   albuterol  (VENTOLIN  HFA) 108 (90 Base) MCG/ACT inhaler Inhale 2 puffs into the lungs every 6 (six) hours as needed for wheezing or shortness of breath. 18 g 1   amLODipine  (NORVASC ) 10  MG tablet Take 1 tablet (10 mg total) by mouth daily. 90 tablet 3   aspirin  81 MG tablet Take 81 mg by mouth daily.       atorvastatin  (LIPITOR) 20 MG tablet Take 1 tablet (20 mg total) by mouth daily. Stop simvastatin  90 tablet 3   Budeson-Glycopyrrol-Formoterol (BREZTRI  AEROSPHERE) 160-9-4.8 MCG/ACT AERO Inhale 2 puffs into the lungs 2 (two) times daily. 10.7 g 11   finasteride  (PROSCAR ) 5 MG tablet TAKE 1 TABLET BY MOUTH DAILY 90 tablet 0   furosemide  (LASIX ) 40 MG tablet Take 1 tablet (40 mg total) by mouth daily as needed. 30 tablet 0   lisinopril  (ZESTRIL ) 40 MG tablet TAKE 1 TABLET BY MOUTH DAILY **MUST CALL MD FOR APPOINTMENT 90 tablet 0   potassium chloride  SA (KLOR-CON  M20) 20 MEQ tablet Take 1 tablet (20 mEq total) by mouth daily. 90 tablet 1   sitaGLIPtin  (JANUVIA ) 100 MG tablet Take 1 tablet (100 mg total) by mouth daily. 30 tablet 2   traMADol  (ULTRAM ) 50 MG tablet Take 1 tablet (50 mg total) by mouth every 8 (eight) hours as needed. 30 tablet 3   No current facility-administered medications on file prior to visit.   No Known Allergies  Social History  Socioeconomic History   Marital status: Divorced    Spouse name: Not on file   Number of children: 1   Years of education: 8   Highest education level: Not on file  Occupational History   Occupation: RETIRED  Tobacco Use   Smoking status: Every Day    Current packs/day: 1.00    Average packs/day: 1 pack/day for 60.0 years (60.0 ttl pk-yrs)    Types: Cigarettes   Smokeless tobacco: Never  Vaping Use   Vaping status: Never Used  Substance and Sexual Activity   Alcohol use: Not Currently    Comment: 03/02/2016 nothing since 1973   Drug use: No   Sexual activity: Never  Other Topics Concern   Not on file  Social History Narrative   Patient is single with one child. (Lives alone) Son does most of the cooking and meal prep for him    Patient is left handed.Patient has an 8 th grade education.Patient drinks up to 3  cups daily.   Social Drivers of Corporate investment banker Strain: Low Risk  (04/06/2022)   Overall Financial Resource Strain (CARDIA)    Difficulty of Paying Living Expenses: Not hard at all  Food Insecurity: No Food Insecurity (04/06/2022)   Hunger Vital Sign    Worried About Running Out of Food in the Last Year: Never true    Ran Out of Food in the Last Year: Never true  Transportation Needs: No Transportation Needs (04/06/2022)   PRAPARE - Administrator, Civil Service (Medical): No    Lack of Transportation (Non-Medical): No  Physical Activity: Inactive (06/06/2023)   Exercise Vital Sign    Days of Exercise per Week: 0 days    Minutes of Exercise per Session: 0 min  Stress: Stress Concern Present (06/06/2023)   [employer] of Occupational Health - Occupational Stress Questionnaire    Feeling of Stress : To some extent  Social Connections: Socially Isolated (08/03/2022)   Social Connection and Isolation Panel    Frequency of Communication with Friends and Family: Twice a week    Frequency of Social Gatherings with Friends and Family: Twice a week    Attends Religious Services: Never    Database administrator or Organizations: No    Attends Banker Meetings: Never    Marital Status: Divorced  Catering manager Violence: Not At Risk (04/06/2022)   Humiliation, Afraid, Rape, and Kick questionnaire    Fear of Current or Ex-Partner: No    Emotionally Abused: No    Physically Abused: No    Sexually Abused: No   Family History  Problem Relation Age of Onset   Heart attack Father    Heart attack Sister    Lung cancer Sister       Review of Systems  All other systems reviewed and are negative.      Objective:   Physical Exam Vitals reviewed.  Constitutional:      General: He is not in acute distress.    Appearance: He is well-developed. He is not diaphoretic.  HENT:     Head: Normocephalic and atraumatic.     Right Ear: External ear normal.      Left Ear: External ear normal.     Nose: Nose normal.     Mouth/Throat:     Pharynx: No oropharyngeal exudate.   Eyes:     General: No scleral icterus.       Right eye: No discharge.  Left eye: No discharge.     Conjunctiva/sclera: Conjunctivae normal.     Pupils: Pupils are equal, round, and reactive to light.   Neck:     Vascular: No JVD.   Cardiovascular:     Rate and Rhythm: Normal rate and regular rhythm.     Heart sounds: Normal heart sounds. No murmur heard.    No friction rub. No gallop.  Pulmonary:     Effort: Pulmonary effort is normal. No respiratory distress.     Breath sounds: No stridor. No wheezing, rhonchi or rales.  Chest:     Chest wall: No tenderness.  Abdominal:     General: Bowel sounds are normal. There is no distension.     Palpations: Abdomen is soft. There is no mass.     Tenderness: There is no abdominal tenderness. There is no guarding or rebound.   Musculoskeletal:        General: No tenderness or deformity. Normal range of motion.     Cervical back: Normal range of motion and neck supple.     Right lower leg: Edema present.     Left lower leg: Edema present.  Lymphadenopathy:     Cervical: No cervical adenopathy.   Skin:    General: Skin is warm.     Coloration: Skin is not pale.     Findings: No erythema or rash.   Neurological:     Mental Status: He is alert and oriented to person, place, and time.     Cranial Nerves: No cranial nerve deficit.     Motor: No abnormal muscle tone.     Coordination: Coordination normal.     Deep Tendon Reflexes: Reflexes are normal and symmetric.   Psychiatric:        Behavior: Behavior normal.        Thought Content: Thought content normal.        Judgment: Judgment normal.           Assessment & Plan:  Controlled type 2 diabetes mellitus without complication, without long-term current use of insulin  (HCC) - Plan: Hemoglobin A1c, CBC with Differential/Platelet, Comprehensive metabolic  panel with GFR, Lipid panel  Stage 3b chronic kidney disease (HCC) - Plan: Hemoglobin A1c, CBC with Differential/Platelet, Comprehensive metabolic panel with GFR, Lipid panel  Pure hypercholesterolemia  Benign essential HTN - Plan: Hemoglobin A1c, CBC with Differential/Platelet, Comprehensive metabolic panel with GFR, Lipid panel  Acute exacerbation of chronic obstructive pulmonary disease (COPD) (HCC) - Plan: budesonide-glycopyrrolate-formoterol (BREZTRI  AEROSPHERE) 160-9-4.8 MCG/ACT AERO inhaler I am very happy with his blood pressure today.  Encouraged the patient to try to do more exercises to strengthen his legs.  We discussed some exercises that he can do safely.  I will check a hemoglobin A1c.  I would like to keep this below 7.  Monitor his renal function.  Monitor his fasting lipid panel.  Patient appears euvolemic today on exam.  Patient has chronic kidney disease stage IIIb but this is stable.

## 2023-08-23 ENCOUNTER — Ambulatory Visit: Payer: Self-pay | Admitting: Family Medicine

## 2023-08-23 LAB — CBC WITH DIFFERENTIAL/PLATELET
Absolute Lymphocytes: 1217 {cells}/uL (ref 850–3900)
Absolute Monocytes: 967 {cells}/uL — ABNORMAL HIGH (ref 200–950)
Basophils Absolute: 62 {cells}/uL (ref 0–200)
Basophils Relative: 0.6 %
Eosinophils Absolute: 260 {cells}/uL (ref 15–500)
Eosinophils Relative: 2.5 %
HCT: 43.8 % (ref 38.5–50.0)
Hemoglobin: 14.3 g/dL (ref 13.2–17.1)
MCH: 31.8 pg (ref 27.0–33.0)
MCHC: 32.6 g/dL (ref 32.0–36.0)
MCV: 97.6 fL (ref 80.0–100.0)
MPV: 11 fL (ref 7.5–12.5)
Monocytes Relative: 9.3 %
Neutro Abs: 7894 {cells}/uL — ABNORMAL HIGH (ref 1500–7800)
Neutrophils Relative %: 75.9 %
Platelets: 200 10*3/uL (ref 140–400)
RBC: 4.49 10*6/uL (ref 4.20–5.80)
RDW: 14.4 % (ref 11.0–15.0)
Total Lymphocyte: 11.7 %
WBC: 10.4 10*3/uL (ref 3.8–10.8)

## 2023-08-23 LAB — COMPREHENSIVE METABOLIC PANEL WITH GFR
AG Ratio: 1.6 (calc) (ref 1.0–2.5)
ALT: 10 U/L (ref 9–46)
AST: 10 U/L (ref 10–35)
Albumin: 4.2 g/dL (ref 3.6–5.1)
Alkaline phosphatase (APISO): 86 U/L (ref 35–144)
BUN/Creatinine Ratio: 11 (calc) (ref 6–22)
BUN: 24 mg/dL (ref 7–25)
CO2: 22 mmol/L (ref 20–32)
Calcium: 9 mg/dL (ref 8.6–10.3)
Chloride: 110 mmol/L (ref 98–110)
Creat: 2.27 mg/dL — ABNORMAL HIGH (ref 0.70–1.22)
Globulin: 2.6 g/dL (ref 1.9–3.7)
Glucose, Bld: 131 mg/dL — ABNORMAL HIGH (ref 65–99)
Potassium: 4.6 mmol/L (ref 3.5–5.3)
Sodium: 141 mmol/L (ref 135–146)
Total Bilirubin: 0.3 mg/dL (ref 0.2–1.2)
Total Protein: 6.8 g/dL (ref 6.1–8.1)
eGFR: 28 mL/min/{1.73_m2} — ABNORMAL LOW (ref >=60)

## 2023-08-23 LAB — LIPID PANEL
Cholesterol: 131 mg/dL (ref <200)
HDL: 37 mg/dL — ABNORMAL LOW (ref >=40)
LDL Cholesterol (Calc): 69 mg/dL
Non-HDL Cholesterol (Calc): 94 mg/dL (ref <130)
Total CHOL/HDL Ratio: 3.5 (calc) (ref <5.0)
Triglycerides: 170 mg/dL — ABNORMAL HIGH (ref <150)

## 2023-08-23 LAB — HEMOGLOBIN A1C
Hgb A1c MFr Bld: 6.5 % — ABNORMAL HIGH (ref <5.7)
Mean Plasma Glucose: 140 mg/dL
eAG (mmol/L): 7.7 mmol/L

## 2023-08-28 ENCOUNTER — Other Ambulatory Visit: Payer: Self-pay | Admitting: Family Medicine

## 2023-09-05 ENCOUNTER — Other Ambulatory Visit: Payer: Self-pay | Admitting: Family Medicine

## 2023-10-05 ENCOUNTER — Ambulatory Visit

## 2023-10-05 VITALS — Ht 71.0 in | Wt 215.0 lb

## 2023-10-05 DIAGNOSIS — Z0000 Encounter for general adult medical examination without abnormal findings: Secondary | ICD-10-CM | POA: Diagnosis not present

## 2023-10-05 NOTE — Progress Notes (Signed)
 Subjective:   Scott Ward is a 83 y.o. who presents for a Medicare Wellness preventive visit.  As a reminder, Annual Wellness Visits don't include a physical exam, and some assessments may be limited, especially if this visit is performed virtually. We may recommend an in-person follow-up visit with your provider if needed.  Visit Complete: Virtual I connected with  Deryl D Bibbins on 10/05/23 by a audio enabled telemedicine application and verified that I am speaking with the correct person using two identifiers.  Patient Location: Home  Provider Location: Home Office  I discussed the limitations of evaluation and management by telemedicine. The patient expressed understanding and agreed to proceed.  Vital Signs: Because this visit was a virtual/telehealth visit, some criteria may be missing or patient reported. Any vitals not documented were not able to be obtained and vitals that have been documented are patient reported.  VideoDeclined- This patient declined Librarian, academic. Therefore the visit was completed with audio only.  Persons Participating in Visit: Patient assisted by son Jahdiel Krol .  AWV Questionnaire: No: Patient Medicare AWV questionnaire was not completed prior to this visit.  Cardiac Risk Factors include: advanced age (>69men, >64 women);hypertension;male gender;smoking/ tobacco exposure;diabetes mellitus     Objective:    Today's Vitals   10/05/23 0838  Weight: 215 lb (97.5 kg)  Height: 5' 11 (1.803 m)   Body mass index is 29.99 kg/m.     10/05/2023    9:59 AM 04/06/2022   10:27 AM 02/19/2021   10:41 AM 11/08/2019    8:50 AM 09/27/2019    7:33 PM 02/06/2019    8:08 AM 02/02/2019    8:47 AM  Advanced Directives  Does Patient Have a Medical Advance Directive? Yes Yes No Yes No Yes Yes  Type of Estate agent of [place];Living will Living will;Healthcare Power of [employer] Power of [place];Living  will  Healthcare Power of [place];Living will Healthcare Power of [place];Living will  Does patient want to make changes to medical advance directive? No - Patient declined No - Patient declined No - Patient declined   No - Patient declined No - Patient declined  Copy of Healthcare Power of Attorney in Chart? Yes - validated most recent copy scanned in chart (See row information) No - copy requested    No - copy requested No - copy requested  Would patient like information on creating a medical advance directive?   No - Patient declined  No - Patient declined      Current Medications (verified) Outpatient Encounter Medications as of 10/05/2023  Medication Sig   albuterol  (VENTOLIN  HFA) 108 (90 Base) MCG/ACT inhaler Inhale 2 puffs into the lungs every 6 (six) hours as needed for wheezing or shortness of breath.   amLODipine  (NORVASC ) 10 MG tablet TAKE 1 TABLET BY MOUTH DAILY   aspirin  81 MG tablet Take 81 mg by mouth daily.     atorvastatin  (LIPITOR) 20 MG tablet TAKE 1 TABLET BY MOUTH DAILY; STOP TAKING SIMVASTATIN  !   budesonide-glycopyrrolate-formoterol (BREZTRI  AEROSPHERE) 160-9-4.8 MCG/ACT AERO inhaler Inhale 2 puffs into the lungs 2 (two) times daily.   finasteride  (PROSCAR ) 5 MG tablet TAKE 1 TABLET BY MOUTH DAILY   furosemide  (LASIX ) 40 MG tablet TAKE 1 TABLET BY MOUTH DAILY AS NEEDED   lisinopril  (ZESTRIL ) 40 MG tablet TAKE 1 TABLET BY MOUTH DAILY; **MUST CALL MD FOR APPOINTMENT FOR FUTURE REFILLS   potassium chloride  SA (KLOR-CON  M20) 20 MEQ tablet Take  1 tablet (20 mEq total) by mouth daily.   sitaGLIPtin  (JANUVIA ) 100 MG tablet Take 1 tablet (100 mg total) by mouth daily.   traMADol  (ULTRAM ) 50 MG tablet Take 1 tablet (50 mg total) by mouth every 8 (eight) hours as needed.   No facility-administered encounter medications on file as of 10/05/2023.    Allergies (verified) Patient has no known allergies.   History: Past Medical History:  Diagnosis Date   Arthritis    lower back  (03/02/2016)   CAP (community acquired pneumonia) 2018   Right Middle Lobe   Colon polyp    COPD (chronic obstructive pulmonary disease) (HCC) dx'd 03/02/2016   Dermatochalasis    Diabetes mellitus without complication (HCC)    Fatty liver    High blood pressure    High cholesterol    History of BPH    History of colon polyps    HOH (hard of hearing)    Nicotine  abuse    Right carotid bruit    Past Surgical History:  Procedure Laterality Date   BLEPHAROPLASTY Bilateral    CATARACT EXTRACTION W/ INTRAOCULAR LENS  IMPLANT, BILATERAL     COLONOSCOPY     TRANSURETHRAL RESECTION OF PROSTATE N/A 02/06/2019   Procedure: TRANSURETHRAL RESECTION OF THE PROSTATE (TURP);  Surgeon: Watt Rush, MD;  Location: [facility];  Service: Urology;  Laterality: N/A;   Family History  Problem Relation Age of Onset   Heart attack Father    Heart attack Sister    Lung cancer Sister    Social History   Socioeconomic History   Marital status: Divorced    Spouse name: Not on file   Number of children: 1   Years of education: 8   Highest education level: Not on file  Occupational History   Occupation: RETIRED  Tobacco Use   Smoking status: Every Day    Current packs/day: 2.00    Average packs/day: 2.0 packs/day for 60.0 years (120.0 ttl pk-yrs)    Types: Cigarettes   Smokeless tobacco: Never  Vaping Use   Vaping status: Never Used  Substance and Sexual Activity   Alcohol use: Not Currently    Comment: 03/02/2016 nothing since 1973   Drug use: No   Sexual activity: Never  Other Topics Concern   Not on file  Social History Narrative   Patient is single with one child. (Lives alone) Son does most of the cooking and meal prep for him    Patient is left handed.Patient has an 8 th grade education.Patient drinks up to 3 cups daily.   Social Drivers of Corporate investment banker Strain: Low Risk  (10/05/2023)   Overall Financial Resource Strain (CARDIA)    Difficulty of Paying Living Expenses: Not  hard at all  Food Insecurity: No Food Insecurity (10/05/2023)   Hunger Vital Sign    Worried About Running Out of Food in the Last Year: Never true    Ran Out of Food in the Last Year: Never true  Transportation Needs: No Transportation Needs (10/05/2023)   PRAPARE - Administrator, Civil Service (Medical): No    Lack of Transportation (Non-Medical): No  Physical Activity: Inactive (10/05/2023)   Exercise Vital Sign    Days of Exercise per Week: 0 days    Minutes of Exercise per Session: 0 min  Stress: No Stress Concern Present (10/05/2023)   [employer] of Occupational Health - Occupational Stress Questionnaire    Feeling of Stress: Only a little  Social Connections: Socially Isolated (10/05/2023)   Social Connection and Isolation Panel    Frequency of Communication with Friends and Family: Three times a week    Frequency of Social Gatherings with Friends and Family: Three times a week    Attends Religious Services: Never    Active Member of Clubs or Organizations: No    Attends Banker Meetings: Never    Marital Status: Divorced    Tobacco Counseling Ready to quit: Not Answered Counseling given: Not Answered    Clinical Intake:  Pre-visit preparation completed: Yes  Pain : No/denies pain  Diabetes: Yes CBG done?: No Did pt. bring in CBG monitor from home?: No  Lab Results  Component Value Date   HGBA1C 6.5 (H) 08/22/2023   HGBA1C 6.8 (H) 03/14/2023   HGBA1C 6.2 (H) 09/07/2022     How often do you need to have someone help you when you read instructions, pamphlets, or other written materials from your doctor or pharmacy?: 1 - Never  Interpreter Needed?: No  Information entered by :: Charmaine Bloodgood LPN   Activities of Daily Living     10/05/2023    9:58 AM  In your present state of health, do you have any difficulty performing the following activities:  Hearing? 1  Vision? 0  Difficulty concentrating or making decisions? 0  Walking or  climbing stairs? 0  Dressing or bathing? 0  Doing errands, shopping? 1  Preparing Food and eating ? N  Using the Toilet? N  In the past six months, have you accidently leaked urine? N  Do you have problems with loss of bowel control? N  Managing your Medications? N  Managing your Finances? Y  Housekeeping or managing your Housekeeping? N    Patient Care Team: Duanne Butler DASEN, MD as PCP - General (Family Medicine) Nicholaus Sherlean CROME, [facility] (Inactive) as Pharmacist (Pharmacist) Associates, [facility] Frances, Ozell GORMAN HUGHS as [facility] Care Management (Licensed Clinical Social Worker)  I have updated your Care Teams any recent Medical Services you may have received from other providers in the past year.     Assessment:   This is a routine wellness examination for [place].  Hearing/Vision screen Hearing Screening - Comments:: Some hearing difficulties  Vision Screening - Comments:: Wears rx glasses - up to date with routine eye exams with Dr. Zaldivar and [facility]    Goals Addressed             This Visit's Progress    Remain as independent as possible   On track      Depression Screen     10/05/2023   10:02 AM 06/06/2023   10:02 AM 03/14/2023   10:01 AM 09/07/2022    8:35 AM 04/06/2022   10:24 AM 01/04/2022   12:28 PM 11/12/2021    8:00 AM  PHQ 2/9 Scores  PHQ - 2 Score 0 0 0 0 0 0 0    Fall Risk     10/05/2023   10:01 AM 03/14/2023   10:01 AM 09/07/2022    8:35 AM 04/06/2022   10:12 AM 01/04/2022   12:28 PM  Fall Risk   Falls in the past year? 0 1 1 1  0  Number falls in past yr: 0 0 1 0 0  Injury with Fall? 0 0 0 0 0  Risk for fall due to : Impaired mobility History of fall(s);Impaired balance/gait;Impaired mobility;Impaired vision History of fall(s);Impaired balance/gait;Impaired mobility Impaired balance/gait;Impaired mobility No Fall Risks  Follow up Falls prevention discussed;Education provided;Falls evaluation completed Education provided;Falls  prevention discussed;Falls evaluation completed Education provided;Falls prevention discussed;Falls evaluation completed Falls evaluation completed;Education provided;Falls prevention discussed Falls prevention discussed      Data saved with a previous flowsheet row definition    MEDICARE RISK AT HOME:  Medicare Risk at Home Any stairs in or around the home?: No If so, are there any without handrails?: No Home free of loose throw rugs in walkways, pet beds, electrical cords, etc?: Yes Adequate lighting in your home to reduce risk of falls?: Yes Life alert?: No Use of a cane, walker or w/c?: No Grab bars in the bathroom?: Yes Shower chair or bench in shower?: Yes Elevated toilet seat or a handicapped toilet?: Yes  TIMED UP AND GO:  Was the test performed?  No  Cognitive Function: Declined/Normal: No cognitive concerns noted by patient or family. Patient alert, oriented, able to answer questions appropriately and recall recent events. No signs of memory loss or confusion.        04/06/2022   10:28 AM 11/08/2019    8:51 AM  6CIT Screen  What Year? 0 points 0 points  What month? 0 points 0 points  What time? 0 points 0 points  Count back from 20 0 points 0 points  Months in reverse 0 points 0 points  Repeat phrase 0 points 0 points  Total Score 0 points 0 points    Immunizations Immunization History  Administered Date(s) Administered   Fluad Quad(high Dose 65+) 10/26/2018, 11/08/2019, 12/15/2020   Influenza Whole 12/22/2007, 12/25/2008, 01/07/2010   Influenza, High Dose Seasonal PF 01/04/2017   Influenza,inj,Quad PF,6+ Mos 01/18/2013, 05/17/2014, 11/22/2014, 03/03/2016, 11/15/2017   Influenza-Unspecified 11/30/2021, 12/01/2022   Moderna Sars-Covid-2 Vaccination 05/07/2019, 06/04/2019, 02/18/2020   Pneumococcal Conjugate Pcv21, Polysaccharide Crm197 Conjugaf 03/14/2023   Pneumococcal Conjugate-13 01/18/2013   Pneumococcal Polysaccharide-23 03/10/2004, 05/20/2014    RSV,unspecified 12/01/2022   Td 03/10/2004    Screening Tests Health Maintenance  Topic Date Due   Zoster Vaccines- Shingrix (1 of 2) Never done   OPHTHALMOLOGY EXAM  12/02/2020   COVID-19 Vaccine (4 - 2024-25 season) 10/31/2022   Diabetic kidney evaluation - Urine ACR  09/07/2023   FOOT EXAM  09/07/2023   INFLUENZA VACCINE  09/30/2023   HEMOGLOBIN A1C  02/21/2024   Diabetic kidney evaluation - eGFR measurement  08/21/2024   Medicare Annual Wellness (AWV)  10/04/2024   Pneumococcal Vaccine: 50+ Years  Completed   Hepatitis B Vaccines  Aged Out   HPV VACCINES  Aged Out   Meningococcal B Vaccine  Aged Out   DTaP/Tdap/Td  Discontinued    Health Maintenance  Health Maintenance Due  Topic Date Due   Zoster Vaccines- Shingrix (1 of 2) Never done   OPHTHALMOLOGY EXAM  12/02/2020   COVID-19 Vaccine (4 - 2024-25 season) 10/31/2022   Diabetic kidney evaluation - Urine ACR  09/07/2023   FOOT EXAM  09/07/2023   INFLUENZA VACCINE  09/30/2023    Additional Screening:  Vision Screening: Recommended annual ophthalmology exams for early detection of glaucoma and other disorders of the eye. Would you like a referral to an eye doctor? No    Dental Screening: Recommended annual dental exams for proper oral hygiene  Community Resource Referral / Chronic Care Management: CRR required this visit?  No   CCM required this visit?  No   Plan:    I have personally reviewed and noted the following in the patient's chart:   Medical and social history  Use of alcohol, tobacco or illicit drugs  Current medications and supplements including opioid prescriptions. Patient is not currently taking opioid prescriptions. Functional ability and status Nutritional status Physical activity Advanced directives List of other physicians Hospitalizations, surgeries, and ER visits in previous 12 months Vitals Screenings to include cognitive, depression, and falls Referrals and appointments  In  addition, I have reviewed and discussed with patient certain preventive protocols, quality metrics, and best practice recommendations. A written personalized care plan for preventive services as well as general preventive health recommendations were provided to patient.   Lavelle Pfeiffer [place], [place]   03/03/7972   After Visit Summary: (MyChart) Due to this being a telephonic visit, the after visit summary with patients personalized plan was offered to patient via MyChart   Notes: Nothing significant to report at this time.

## 2023-10-05 NOTE — Patient Instructions (Signed)
 Mr. Scott Ward , Thank you for taking time out of your busy schedule to complete your Annual Wellness Visit with me. I enjoyed our conversation and look forward to speaking with you again next year. I, as well as your care team,  appreciate your ongoing commitment to your health goals. Please review the following plan we discussed and let me know if I can assist you in the future. Your Game plan/ To Do List      Follow up Visits: We will see or speak with you next year for your Next Medicare AWV with our clinical staff Have you seen your provider in the last 6 months (3 months if uncontrolled diabetes)? Yes  Clinician Recommendations:  Aim for 30 minutes of exercise or brisk walking, 6-8 glasses of water, and 5 servings of fruits and vegetables each day.       This is a list of the screenings recommended for you:  Health Maintenance  Topic Date Due   Zoster (Shingles) Vaccine (1 of 2) Never done   Eye exam for diabetics  12/02/2020   COVID-19 Vaccine (4 - 2024-25 season) 10/31/2022   Yearly kidney health urinalysis for diabetes  09/07/2023   Complete foot exam   09/07/2023   Flu Shot  09/30/2023   Hemoglobin A1C  02/21/2024   Yearly kidney function blood test for diabetes  08/21/2024   Medicare Annual Wellness Visit  10/04/2024   Pneumococcal Vaccine for age over 98  Completed   Hepatitis B Vaccine  Aged Out   HPV Vaccine  Aged Out   Meningitis B Vaccine  Aged Out   DTaP/Tdap/Td vaccine  Discontinued    Advanced directives: (In Chart) A copy of your advanced directives are scanned into your chart should your provider ever need it.  Advance Care Planning is important because it:  [x]  Makes sure you receive the medical care that is consistent with your values, goals, and preferences  [x]  It provides guidance to your family and loved ones and reduces their decisional burden about whether or not they are making the right decisions based on your wishes.  Follow the link provided in your  after visit summary or read over the paperwork we have mailed to you to help you started getting your Advance Directives in place. If you need assistance in completing these, please reach out to us  so that we can help you!  See attachments for Preventive Care and Fall Prevention Tips.

## 2023-10-11 ENCOUNTER — Other Ambulatory Visit: Payer: Self-pay | Admitting: Family Medicine

## 2023-10-11 ENCOUNTER — Encounter: Payer: Self-pay | Admitting: Family Medicine

## 2023-10-11 MED ORDER — TRAMADOL HCL 50 MG PO TABS
50.0000 mg | ORAL_TABLET | Freq: Three times a day (TID) | ORAL | 0 refills | Status: DC | PRN
Start: 2023-10-11 — End: 2024-01-03

## 2023-10-23 ENCOUNTER — Other Ambulatory Visit: Payer: Self-pay | Admitting: Family Medicine

## 2023-10-23 DIAGNOSIS — N138 Other obstructive and reflux uropathy: Secondary | ICD-10-CM

## 2023-10-24 NOTE — Telephone Encounter (Signed)
 Requested medication (s) are due for refill today - yes  Requested medication (s) are on the active medication list -yes  Future visit scheduled -yes  Last refill: 07/20/23 #90  Notes to clinic: fails lab protocol- over 1 year-10/26/18  Requested Prescriptions  Pending Prescriptions Disp Refills   finasteride  (PROSCAR ) 5 MG tablet [Pharmacy Med Name: FINASTERIDE  5 MG TABLET] 90 tablet 0    Sig: TAKE 1 TABLET BY MOUTH DAILY     Urology: 5-alpha Reductase Inhibitors Failed - 10/24/2023  4:30 PM      Failed - PSA in normal range and within 360 days    PSA  Date Value Ref Range Status  10/26/2018 0.6 < OR = 4.0 ng/mL Final    Comment:    The total PSA value from this assay system is  standardized against the WHO standard. The test  result will be approximately 20% lower when compared  to the equimolar-standardized total PSA (Beckman  Coulter). Comparison of serial PSA results should be  interpreted with this fact in mind. . This test was performed using the Siemens  chemiluminescent method. Values obtained from  different assay methods cannot be used interchangeably. PSA levels, regardless of value, should not be interpreted as absolute evidence of the presence or absence of disease.          Passed - Valid encounter within last 12 months    Recent Outpatient Visits           2 months ago Controlled type 2 diabetes mellitus without complication, without long-term current use of insulin  (HCC)   [place] [facility] [name], Butler DASEN, MD   7 months ago Controlled type 2 diabetes mellitus without complication, without long-term current use of insulin  [facility])   [place] [facility] Duanne Butler DASEN, MD   1 year ago Diabetes mellitus without complication [facility])   [place] [facility] Duanne Butler DASEN, MD   1 year ago Right sided sciatica   [facility] Duanne Butler DASEN, MD   1 year ago Stage  3b chronic kidney disease [facility])   [place] [facility] [name], Butler DASEN, MD              Signed Prescriptions Disp Refills   sitaGLIPtin  (JANUVIA ) 100 MG tablet 90 tablet 0    Sig: TAKE 1 TABLET BY MOUTH DAILY     Endocrinology:  Diabetes - DPP-4 Inhibitors Failed - 10/24/2023  4:30 PM      Failed - Cr in normal range and within 360 days    Creat  Date Value Ref Range Status  08/22/2023 2.27 (H) 0.70 - 1.22 mg/dL Final   Creatinine, Urine  Date Value Ref Range Status  09/07/2022 127 20 - 320 mg/dL Final         Passed - HBA1C is between 0 and 7.9 and within 180 days    Hgb A1c MFr Bld  Date Value Ref Range Status  08/22/2023 6.5 (H) <5.7 % Final    Comment:    For someone without known diabetes, a hemoglobin A1c value of 6.5% or greater indicates that they may have  diabetes and this should be confirmed with a follow-up  test. . For someone with known diabetes, a value <7% indicates  that their diabetes is well controlled and a value  greater than or equal to 7% indicates suboptimal  control. A1c targets should be individualized based on  duration of diabetes, age, comorbid conditions, and  other considerations. . Currently, no consensus exists regarding use of hemoglobin A1c for diagnosis of diabetes for children. SABRA Amy - Valid encounter within last 6 months    Recent Outpatient Visits           2 months ago Controlled type 2 diabetes mellitus without complication, without long-term current use of insulin  (HCC)   [place] [facility] Duanne Butler DASEN, MD   7 months ago Controlled type 2 diabetes mellitus without complication, without long-term current use of insulin  [facility])   [facility] [name], Butler DASEN, MD   1 year ago Diabetes mellitus without complication [facility])   [place] [facility] [name], Butler DASEN, MD   1 year ago Right sided sciatica   [place]  [facility] Duanne Butler DASEN, MD   1 year ago Stage 3b chronic kidney disease [facility])   [place] [facility] Duanne Butler DASEN, MD                 Requested Prescriptions  Pending Prescriptions Disp Refills   finasteride  (PROSCAR ) 5 MG tablet [Pharmacy Med Name: FINASTERIDE  5 MG TABLET] 90 tablet 0    Sig: TAKE 1 TABLET BY MOUTH DAILY     Urology: 5-alpha Reductase Inhibitors Failed - 10/24/2023  4:30 PM      Failed - PSA in normal range and within 360 days    PSA  Date Value Ref Range Status  10/26/2018 0.6 < OR = 4.0 ng/mL Final    Comment:    The total PSA value from this assay system is  standardized against the WHO standard. The test  result will be approximately 20% lower when compared  to the equimolar-standardized total PSA (Beckman  Coulter). Comparison of serial PSA results should be  interpreted with this fact in mind. . This test was performed using the Siemens  chemiluminescent method. Values obtained from  different assay methods cannot be used interchangeably. PSA levels, regardless of value, should not be interpreted as absolute evidence of the presence or absence of disease.          Passed - Valid encounter within last 12 months    Recent Outpatient Visits           2 months ago Controlled type 2 diabetes mellitus without complication, without long-term current use of insulin  (HCC)   [place] [facility] [name], Butler DASEN, MD   7 months ago Controlled type 2 diabetes mellitus without complication, without long-term current use of insulin  [facility])   [place] [facility] [name], Butler DASEN, MD   1 year ago Diabetes mellitus without complication [facility])   [place] [facility] [name], Butler DASEN, MD   1 year ago Right sided sciatica   [place] [facility] Duanne Butler DASEN, MD   1 year ago Stage 3b chronic kidney disease [facility])   [place]  [facility] Duanne Butler DASEN, MD              Signed Prescriptions Disp Refills   sitaGLIPtin  (JANUVIA ) 100 MG tablet 90 tablet 0    Sig: TAKE 1 TABLET BY MOUTH DAILY     Endocrinology:  Diabetes - DPP-4 Inhibitors Failed - 10/24/2023  4:30 PM      Failed -  Cr in normal range and within 360 days    Creat  Date Value Ref Range Status  08/22/2023 2.27 (H) 0.70 - 1.22 mg/dL Final   Creatinine, Urine  Date Value Ref Range Status  09/07/2022 127 20 - 320 mg/dL Final         Passed - HBA1C is between 0 and 7.9 and within 180 days    Hgb A1c MFr Bld  Date Value Ref Range Status  08/22/2023 6.5 (H) <5.7 % Final    Comment:    For someone without known diabetes, a hemoglobin A1c value of 6.5% or greater indicates that they may have  diabetes and this should be confirmed with a follow-up  test. . For someone with known diabetes, a value <7% indicates  that their diabetes is well controlled and a value  greater than or equal to 7% indicates suboptimal  control. A1c targets should be individualized based on  duration of diabetes, age, comorbid conditions, and  other considerations. . Currently, no consensus exists regarding use of hemoglobin A1c for diagnosis of diabetes for children. SABRA Amy - Valid encounter within last 6 months    Recent Outpatient Visits           2 months ago Controlled type 2 diabetes mellitus without complication, without long-term current use of insulin  (HCC)   [place] [facility] Duanne Butler DASEN, MD   7 months ago Controlled type 2 diabetes mellitus without complication, without long-term current use of insulin  [facility])   [place] Duanne Butler DASEN, MD   1 year ago Diabetes mellitus without complication [facility])   [place] [facility] Duanne Butler DASEN, MD   1 year ago Right sided sciatica   [place] [facility] Duanne Butler DASEN,  MD   1 year ago Stage 3b chronic kidney disease [facility])   [place] [facility] [name], Butler DASEN, MD

## 2023-10-24 NOTE — Telephone Encounter (Signed)
 Requested Prescriptions  Pending Prescriptions Disp Refills   finasteride  (PROSCAR ) 5 MG tablet [Pharmacy Med Name: FINASTERIDE  5 MG TABLET] 90 tablet 0    Sig: TAKE 1 TABLET BY MOUTH DAILY     Urology: 5-alpha Reductase Inhibitors Failed - 10/24/2023  4:30 PM      Failed - PSA in normal range and within 360 days    PSA  Date Value Ref Range Status  10/26/2018 0.6 < OR = 4.0 ng/mL Final    Comment:    The total PSA value from this assay system is  standardized against the WHO standard. The test  result will be approximately 20% lower when compared  to the equimolar-standardized total PSA (Beckman  Coulter). Comparison of serial PSA results should be  interpreted with this fact in mind. . This test was performed using the Siemens  chemiluminescent method. Values obtained from  different assay methods cannot be used interchangeably. PSA levels, regardless of value, should not be interpreted as absolute evidence of the presence or absence of disease.          Passed - Valid encounter within last 12 months    Recent Outpatient Visits           2 months ago Controlled type 2 diabetes mellitus without complication, without long-term current use of insulin  (HCC)   [place] [facility] [name], Butler DASEN, MD   7 months ago Controlled type 2 diabetes mellitus without complication, without long-term current use of insulin  [facility])   [place] [facility] Duanne Butler DASEN, MD   1 year ago Diabetes mellitus without complication [facility])   [place] [facility] Duanne Butler DASEN, MD   1 year ago Right sided sciatica   [place] [facility] Duanne Butler DASEN, MD   1 year ago Stage 3b chronic kidney disease [facility])   [place] [facility] [name], Butler DASEN, MD               sitaGLIPtin  (JANUVIA ) 100 MG tablet [Pharmacy Med Name: JANUVIA  100 MG TABLET] 90 tablet 0    Sig: TAKE 1 TABLET BY MOUTH  DAILY     Endocrinology:  Diabetes - DPP-4 Inhibitors Failed - 10/24/2023  4:30 PM      Failed - Cr in normal range and within 360 days    Creat  Date Value Ref Range Status  08/22/2023 2.27 (H) 0.70 - 1.22 mg/dL Final   Creatinine, Urine  Date Value Ref Range Status  09/07/2022 127 20 - 320 mg/dL Final         Passed - HBA1C is between 0 and 7.9 and within 180 days    Hgb A1c MFr Bld  Date Value Ref Range Status  08/22/2023 6.5 (H) <5.7 % Final    Comment:    For someone without known diabetes, a hemoglobin A1c value of 6.5% or greater indicates that they may have  diabetes and this should be confirmed with a follow-up  test. . For someone with known diabetes, a value <7% indicates  that their diabetes is well controlled and a value  greater than or equal to 7% indicates suboptimal  control. A1c targets should be individualized based on  duration of diabetes, age, comorbid conditions, and  other considerations. . Currently, no consensus exists regarding use of hemoglobin A1c for diagnosis of diabetes for children. SABRA Amy - Valid  encounter within last 6 months    Recent Outpatient Visits           2 months ago Controlled type 2 diabetes mellitus without complication, without long-term current use of insulin  [facility])   [place] [facility] Duanne Butler DASEN, MD   7 months ago Controlled type 2 diabetes mellitus without complication, without long-term current use of insulin  [facility])   [place] [facility] Duanne Butler DASEN, MD   1 year ago Diabetes mellitus without complication [facility])   [place] [facility] Duanne Butler DASEN, MD   1 year ago Right sided sciatica   [place] [facility] Duanne Butler DASEN, MD   1 year ago Stage 3b chronic kidney disease [facility])   [place] [facility] [name], Butler DASEN, MD

## 2023-11-19 ENCOUNTER — Other Ambulatory Visit: Payer: Self-pay | Admitting: Family Medicine

## 2023-11-21 NOTE — Telephone Encounter (Signed)
 Requested Prescriptions  Pending Prescriptions Disp Refills   lisinopril  (ZESTRIL ) 40 MG tablet [Pharmacy Med Name: LISINOPRIL  40 MG TABLET] 90 tablet 0    Sig: TAKE 1 TABLET BY MOUTH DAILY - APPOINTMENT NEEDED FOR FURTHER REFILLS     Cardiovascular:  ACE Inhibitors Failed - 11/21/2023 12:56 PM      Failed - Cr in normal range and within 180 days    Creat  Date Value Ref Range Status  08/22/2023 2.27 (H) 0.70 - 1.22 mg/dL Final   Creatinine, Urine  Date Value Ref Range Status  09/07/2022 127 20 - 320 mg/dL Final         Passed - K in normal range and within 180 days    Potassium  Date Value Ref Range Status  08/22/2023 4.6 3.5 - 5.3 mmol/L Final         Passed - Patient is not pregnant      Passed - Last BP in normal range    BP Readings from Last 1 Encounters:  08/22/23 137/77         Passed - Valid encounter within last 6 months    Recent Outpatient Visits           3 months ago Controlled type 2 diabetes mellitus without complication, without long-term current use of insulin  (HCC)   [place] [facility] Du[facility] Butler D[facility]EN, [facility]   8 months ago Controlled type 2 diabetes mellitus without complication, without long-term current use of insulin  [facility])   [place] [facility] Duanne Butler DASEN, MD   1 year ago Diabetes mellitus without complication [facility])   [place] [facility] [name], Butler DASEN, MD   1 year ago Right sided sciatica   [place] [facility] Duanne Butler DASEN, MD   2 years ago Stage 3b chronic kidney disease [facility])   [place] [facility] [name], Butler DASEN, MD

## 2024-01-01 ENCOUNTER — Other Ambulatory Visit: Payer: Self-pay | Admitting: Family Medicine

## 2024-01-16 ENCOUNTER — Other Ambulatory Visit: Payer: Self-pay | Admitting: Family Medicine

## 2024-01-19 NOTE — Telephone Encounter (Signed)
 Requested Prescriptions  Pending Prescriptions Disp Refills   JANUVIA  100 MG tablet [Pharmacy Med Name: JANUVIA  100 MG TABLET] 90 tablet 0    Sig: TAKE 1 TABLET BY MOUTH DAILY     Endocrinology:  Diabetes - DPP-4 Inhibitors Failed - 01/19/2024 10:28 AM      Failed - Cr in normal range and within 360 days    Creat  Date Value Ref Range Status  08/22/2023 2.27 (H) 0.70 - 1.22 mg/dL Final   Creatinine, Urine  Date Value Ref Range Status  09/07/2022 127 20 - 320 mg/dL Final         Passed - HBA1C is between 0 and 7.9 and within 180 days    Hgb A1c MFr Bld  Date Value Ref Range Status  08/22/2023 6.5 (H) <5.7 % Final    Comment:    For someone without known diabetes, a hemoglobin A1c value of 6.5% or greater indicates that they may have  diabetes and this should be confirmed with a follow-up  test. . For someone with known diabetes, a value <7% indicates  that their diabetes is well controlled and a value  greater than or equal to 7% indicates suboptimal  control. A1c targets should be individualized based on  duration of diabetes, age, comorbid conditions, and  other considerations. . Currently, no consensus exists regarding use of hemoglobin A1c for diagnosis of diabetes for children. SABRA Amy - Valid encounter within last 6 months    Recent Outpatient Visits           5 months ago Controlled type 2 diabetes mellitus without complication, without long-term current use of insulin  (HCC)   [place] [facility] Duanne Butler DASEN, MD   10 months ago Controlled type 2 diabetes mellitus without complication, without long-term current use of insulin  [facility])   [place] [facility] Duanne Butler DASEN, MD   1 year ago Diabetes mellitus without complication [facility])   [place] [facility] Duanne Butler DASEN, MD   2 years ago Right sided sciatica   [place] [facility] Duanne Butler DASEN, MD   2 years  ago Stage 3b chronic kidney disease [facility])   [place] [name], Butler DASEN, MD

## 2024-01-23 ENCOUNTER — Other Ambulatory Visit: Payer: Self-pay | Admitting: Family Medicine

## 2024-01-23 DIAGNOSIS — N401 Enlarged prostate with lower urinary tract symptoms: Secondary | ICD-10-CM

## 2024-01-24 NOTE — Telephone Encounter (Signed)
 Requested Prescriptions  Pending Prescriptions Disp Refills   finasteride  (PROSCAR ) 5 MG tablet [Pharmacy Med Name: FINASTERIDE  5 MG TABLET] 90 tablet 0    Sig: TAKE 1 TABLET BY MOUTH DAILY     Urology: 5-alpha Reductase Inhibitors Failed - 01/24/2024 11:12 AM      Failed - PSA in normal range and within 360 days    PSA  Date Value Ref Range Status  10/26/2018 0.6 < OR = 4.0 ng/mL Final    Comment:    The total PSA value from this assay system is  standardized against the WHO standard. The test  result will be approximately 20% lower when compared  to the equimolar-standardized total PSA (Beckman  Coulter). Comparison of serial PSA results should be  interpreted with this fact in mind. . This test was performed using the Siemens  chemiluminescent method. Values obtained from  different assay methods cannot be used interchangeably. PSA levels, regardless of value, should not be interpreted as absolute evidence of the presence or absence of disease.          Passed - Valid encounter within last 12 months    Recent Outpatient Visits           5 months ago Controlled type 2 diabetes mellitus without complication, without long-term current use of insulin  (HCC)   [facility] [name], Butler DASEN, MD   10 months ago Controlled type 2 diabetes mellitus without complication, without long-term current use of insulin  [facility])   [place] [facility] Duanne Butler DASEN, MD   1 year ago Diabetes mellitus without complication [facility])   [place] [facility] Duanne Butler DASEN, MD   2 years ago Right sided sciatica   [place] [facility] Duanne Butler DASEN, MD   2 years ago Stage 3b chronic kidney disease [facility])   [place] [facility] [name], Butler DASEN, MD               JANUVIA  100 MG tablet [Pharmacy Med Name: JANUVIA  100 MG TABLET] 30 tablet     Sig: TAKE 1 TABLET BY MOUTH DAILY      Endocrinology:  Diabetes - DPP-4 Inhibitors Failed - 01/24/2024 11:12 AM      Failed - Cr in normal range and within 360 days    Creat  Date Value Ref Range Status  08/22/2023 2.27 (H) 0.70 - 1.22 mg/dL Final   Creatinine, Urine  Date Value Ref Range Status  09/07/2022 127 20 - 320 mg/dL Final         Passed - HBA1C is between 0 and 7.9 and within 180 days    Hgb A1c MFr Bld  Date Value Ref Range Status  08/22/2023 6.5 (H) <5.7 % Final    Comment:    For someone without known diabetes, a hemoglobin A1c value of 6.5% or greater indicates that they may have  diabetes and this should be confirmed with a follow-up  test. . For someone with known diabetes, a value <7% indicates  that their diabetes is well controlled and a value  greater than or equal to 7% indicates suboptimal  control. A1c targets should be individualized based on  duration of diabetes, age, comorbid conditions, and  other considerations. . Currently, no consensus exists regarding use of hemoglobin A1c for diagnosis of diabetes for children. SABRA Amy - Valid encounter within last  6 months    Recent Outpatient Visits           5 months ago Controlled type 2 diabetes mellitus without complication, without long-term current use of insulin  [facility])   [place] [facility] [name], Butler DASEN, MD   10 months ago Controlled type 2 diabetes mellitus without complication, without long-term current use of insulin  [facility])   [place] [facility] Duanne Butler DASEN, MD   1 year ago Diabetes mellitus without complication [facility])   [place] [facility] Duanne Butler DASEN, MD   2 years ago Right sided sciatica   [place] [facility] Medicine Duanne Butler DASEN, MD   2 years ago Stage 3b chronic kidney disease [facility])   [place] [facility] [name], Butler DASEN, MD

## 2024-02-13 ENCOUNTER — Other Ambulatory Visit: Payer: Self-pay | Admitting: Family Medicine

## 2024-02-20 ENCOUNTER — Other Ambulatory Visit: Payer: Self-pay | Admitting: Family Medicine

## 2024-02-21 ENCOUNTER — Ambulatory Visit: Admitting: Family Medicine

## 2024-02-21 VITALS — BP 134/76 | HR 86 | Temp 98.5°F | Ht 71.0 in | Wt 215.0 lb

## 2024-02-21 DIAGNOSIS — I10 Essential (primary) hypertension: Secondary | ICD-10-CM | POA: Diagnosis not present

## 2024-02-21 DIAGNOSIS — E7800 Pure hypercholesterolemia, unspecified: Secondary | ICD-10-CM

## 2024-02-21 DIAGNOSIS — E1129 Type 2 diabetes mellitus with other diabetic kidney complication: Secondary | ICD-10-CM

## 2024-02-21 DIAGNOSIS — N1832 Chronic kidney disease, stage 3b: Secondary | ICD-10-CM | POA: Diagnosis not present

## 2024-02-21 DIAGNOSIS — E1122 Type 2 diabetes mellitus with diabetic chronic kidney disease: Secondary | ICD-10-CM | POA: Diagnosis not present

## 2024-02-21 NOTE — Progress Notes (Signed)
 "   Subjective:    Patient ID: Scott Ward, male    DOB: March 20, 1940, 83 y.o.   MRN: [id number] Patient presents today for his regular follow-up.  He has a history of stage IIIb chronic kidney disease, type 2 diabetes, smoking, COPD.  He continues to smoke 2 packs a day but he denies any chest pain or shortness of breath or dyspnea on exertion.  He benefits from taking breztri  2 puffs twice daily according to his son.  He has not used Lasix  in more than a month.  He does have +1 pitting edema in both legs to his knees today.  I encouraged him to take Lasix  for the next 3 days.  However he denies any orthopnea or paroxysmal nocturnal dyspnea.  He denies any polyuria or polydipsia or blurry vision.  He has had his flu shot.  Past Medical History:  Diagnosis Date   Arthritis    lower back (03/02/2016)   CAP (community acquired pneumonia) 2018   Right Middle Lobe   Colon polyp    COPD (chronic obstructive pulmonary disease) (HCC) dx'd 03/02/2016   Dermatochalasis    Diabetes mellitus without complication (HCC)    Fatty liver    High blood pressure    High cholesterol    History of BPH    History of colon polyps    HOH (hard of hearing)    Nicotine  abuse    Right carotid bruit    Past Surgical History:  Procedure Laterality Date   BLEPHAROPLASTY Bilateral    CATARACT EXTRACTION W/ INTRAOCULAR LENS  IMPLANT, BILATERAL     COLONOSCOPY     TRANSURETHRAL RESECTION OF PROSTATE N/A 02/06/2019   Procedure: TRANSURETHRAL RESECTION OF THE PROSTATE (TURP);  Surgeon: Watt Norleen, MD;  Location: [facility];  Service: Urology;  Laterality: N/A;   Current Outpatient Medications on File Prior to Visit  Medication Sig Dispense Refill   albuterol  (VENTOLIN  HFA) 108 (90 Base) MCG/ACT inhaler Inhale 2 puffs into the lungs every 6 (six) hours as needed for wheezing or shortness of breath. 18 g 1   amLODipine  (NORVASC ) 10 MG tablet TAKE 1 TABLET BY MOUTH DAILY 90 tablet 3   aspirin  81 MG tablet Take 81 mg by  mouth daily.       atorvastatin  (LIPITOR) 20 MG tablet TAKE 1 TABLET BY MOUTH DAILY; STOP TAKING SIMVASTATIN  ! 90 tablet 3   budesonide-glycopyrrolate-formoterol (BREZTRI  AEROSPHERE) 160-9-4.8 MCG/ACT AERO inhaler Inhale 2 puffs into the lungs 2 (two) times daily. 10.7 g 11   finasteride  (PROSCAR ) 5 MG tablet TAKE 1 TABLET BY MOUTH DAILY 90 tablet 0   furosemide  (LASIX ) 40 MG tablet TAKE 1 TABLET BY MOUTH DAILY AS NEEDED 30 tablet 0   JANUVIA  100 MG tablet TAKE 1 TABLET BY MOUTH DAILY 90 tablet 0   lisinopril  (ZESTRIL ) 40 MG tablet TAKE 1 TABLET BY MOUTH DAILY **APPOINTMENT NEEDED FOR MORE REFILLS** 90 tablet 0   potassium chloride  SA (KLOR-CON  M20) 20 MEQ tablet Take 1 tablet (20 mEq total) by mouth daily. 90 tablet 1   traMADol  (ULTRAM ) 50 MG tablet TAKE 1 TABLET BY MOUTH EVERY 8 HOURS AS NEEDED 180 tablet 0   No current facility-administered medications on file prior to visit.   No Known Allergies  Social History   Socioeconomic History   Marital status: Divorced    Spouse name: Not on file   Number of children: 1   Years of education: 8   Highest education level: Not  on file  Occupational History   Occupation: RETIRED  Tobacco Use   Smoking status: Every Day    Current packs/day: 2.00    Average packs/day: 2.0 packs/day for 60.0 years (120.0 ttl pk-yrs)    Types: Cigarettes   Smokeless tobacco: Never  Vaping Use   Vaping status: Never Used  Substance and Sexual Activity   Alcohol use: Not Currently    Comment: 03/02/2016 nothing since 1973   Drug use: No   Sexual activity: Never  Other Topics Concern   Not on file  Social History Narrative   Patient is single with one child. (Lives alone) Son does most of the cooking and meal prep for him    Patient is left handed.Patient has an 8 th grade education.Patient drinks up to 3 cups daily.   Social Drivers of Health   Tobacco Use: High Risk (10/05/2023)   Patient History    Smoking Tobacco Use: Every Day    Smokeless  Tobacco Use: Never    Passive Exposure: Not on file  Financial Resource Strain: Low Risk (10/05/2023)   Overall Financial Resource Strain (CARDIA)    Difficulty of Paying Living Expenses: Not hard at all  Food Insecurity: No Food Insecurity (10/05/2023)   Epic    Worried About Radiation Protection Practitioner of Food in the Last Year: Never true    Ran Out of Food in the Last Year: Never true  Transportation Needs: No Transportation Needs (10/05/2023)   Epic    Lack of Transportation (Medical): No    Lack of Transportation (Non-Medical): No  Physical Activity: Inactive (10/05/2023)   Exercise Vital Sign    Days of Exercise per Week: 0 days    Minutes of Exercise per Session: 0 min  Stress: No Stress Concern Present (10/05/2023)   [employer] of Occupational Health - Occupational Stress Questionnaire    Feeling of Stress: Only a little  Social Connections: Socially Isolated (10/05/2023)   Social Connection and Isolation Panel    Frequency of Communication with Friends and Family: Three times a week    Frequency of Social Gatherings with Friends and Family: Three times a week    Attends Religious Services: Never    Active Member of Clubs or Organizations: No    Attends Banker Meetings: Never    Marital Status: Divorced  Catering Manager Violence: Not At Risk (10/05/2023)   Epic    Fear of Current or Ex-Partner: No    Emotionally Abused: No    Physically Abused: No    Sexually Abused: No  Depression (PHQ2-9): Low Risk (10/05/2023)   Depression (PHQ2-9)    PHQ-2 Score: 0  Alcohol Screen: Low Risk (10/05/2023)   Alcohol Screen    Last Alcohol Screening Score (AUDIT): 0  Housing: Unknown (10/05/2023)   Epic    Unable to Pay for Housing in the Last Year: No    Number of Times Moved in the Last Year: Not on file    Homeless in the Last Year: No  Utilities: Not At Risk (10/05/2023)   Epic    Threatened with loss of utilities: No  Health Literacy: Adequate Health Literacy (10/05/2023)   B[facility] Health  Literacy    Frequency of need for help with medical instructions: Never   Family History  Problem Relation Age of Onset   Heart attack Father    Heart attack Sister    Lung cancer Sister       Review of Systems  All other systems reviewed  and are negative.      Objective:   Physical Exam Vitals reviewed.  Constitutional:      General: He is not in acute distress.    Appearance: He is well-developed. He is not diaphoretic.  HENT:     Head: Normocephalic and atraumatic.     Right Ear: External ear normal.     Left Ear: External ear normal.     Nose: Nose normal.     Mouth/Throat:     Pharynx: No oropharyngeal exudate.  Eyes:     General: No scleral icterus.       Right eye: No discharge.        Left eye: No discharge.     Conjunctiva/sclera: Conjunctivae normal.     Pupils: Pupils are equal, round, and reactive to light.  Neck:     Vascular: No JVD.  Cardiovascular:     Rate and Rhythm: Normal rate and regular rhythm.     Heart sounds: Normal heart sounds. No murmur heard.    No friction rub. No gallop.  Pulmonary:     Effort: Pulmonary effort is normal. No respiratory distress.     Breath sounds: No stridor. No wheezing, rhonchi or rales.  Chest:     Chest wall: No tenderness.  Abdominal:     General: Bowel sounds are normal. There is no distension.     Palpations: Abdomen is soft. There is no mass.     Tenderness: There is no abdominal tenderness. There is no guarding or rebound.  Musculoskeletal:        General: No tenderness or deformity. Normal range of motion.     Cervical back: Normal range of motion and neck supple.     Right lower leg: Edema present.     Left lower leg: Edema present.  Lymphadenopathy:     Cervical: No cervical adenopathy.  Skin:    General: Skin is warm.     Coloration: Skin is not pale.     Findings: No erythema or rash.  Neurological:     Mental Status: He is alert and oriented to person, place, and time.     Cranial Nerves:  No cranial nerve deficit.     Motor: No abnormal muscle tone.     Coordination: Coordination normal.     Deep Tendon Reflexes: Reflexes are normal and symmetric.  Psychiatric:        Behavior: Behavior normal.        Thought Content: Thought content normal.        Judgment: Judgment normal.           Assessment & Plan:  Stage 3b chronic kidney disease (HCC) - Plan: Hemoglobin A1c, CBC with Differential/Platelet, Comprehensive metabolic panel with GFR, Lipid panel, Microalbumin / creatinine urine ratio  Pure hypercholesterolemia - Plan: Hemoglobin A1c, CBC with Differential/Platelet, Comprehensive metabolic panel with GFR, Lipid panel, Microalbumin / creatinine urine ratio  Benign essential HTN - Plan: Hemoglobin A1c, CBC with Differential/Platelet, Comprehensive metabolic panel with GFR, Lipid panel, Microalbumin / creatinine urine ratio  Controlled type 2 diabetes mellitus with kidney complication, without long-term current use of insulin  (HCC) - Plan: Hemoglobin A1c, CBC with Differential/Platelet, Comprehensive metabolic panel with GFR, Lipid panel, Microalbumin / creatinine urine ratio Blood pressure is well-controlled today.  Check a hemoglobin A1c.  Goal hemoglobin A1c is less than 7.  I will check a microalbumin to creatinine ratio.  I would like this to be under 30 to prevent progression of his chronic  kidney disease.  Blood pressure today is well-controlled.  I will check a fasting lipid panel.  Goal LDL cholesterol is less than 70. "

## 2024-02-22 LAB — COMPREHENSIVE METABOLIC PANEL WITH GFR
AG Ratio: 1.6 (calc) (ref 1.0–2.5)
ALT: 11 U/L (ref 9–46)
AST: 10 U/L (ref 10–35)
Albumin: 4.2 g/dL (ref 3.6–5.1)
Alkaline phosphatase (APISO): 97 U/L (ref 35–144)
BUN/Creatinine Ratio: 10 (calc) (ref 6–22)
BUN: 22 mg/dL (ref 7–25)
CO2: 27 mmol/L (ref 20–32)
Calcium: 9 mg/dL (ref 8.6–10.3)
Chloride: 107 mmol/L (ref 98–110)
Creat: 2.26 mg/dL — ABNORMAL HIGH (ref 0.70–1.22)
Globulin: 2.6 g/dL (ref 1.9–3.7)
Glucose, Bld: 144 mg/dL — ABNORMAL HIGH (ref 65–99)
Potassium: 4.6 mmol/L (ref 3.5–5.3)
Sodium: 142 mmol/L (ref 135–146)
Total Bilirubin: 0.3 mg/dL (ref 0.2–1.2)
Total Protein: 6.8 g/dL (ref 6.1–8.1)
eGFR: 28 mL/min/1.73m2 — ABNORMAL LOW

## 2024-02-22 LAB — CBC WITH DIFFERENTIAL/PLATELET
Absolute Lymphocytes: 1491 {cells}/uL (ref 850–3900)
Absolute Monocytes: 956 {cells}/uL — ABNORMAL HIGH (ref 200–950)
Basophils Absolute: 84 {cells}/uL (ref 0–200)
Basophils Relative: 0.8 %
Eosinophils Absolute: 336 {cells}/uL (ref 15–500)
Eosinophils Relative: 3.2 %
HCT: 42.8 % (ref 39.4–51.1)
Hemoglobin: 14.3 g/dL (ref 13.2–17.1)
MCH: 32.1 pg (ref 27.0–33.0)
MCHC: 33.4 g/dL (ref 31.6–35.4)
MCV: 96 fL (ref 81.4–101.7)
MPV: 11 fL (ref 7.5–12.5)
Monocytes Relative: 9.1 %
Neutro Abs: 7634 {cells}/uL (ref 1500–7800)
Neutrophils Relative %: 72.7 %
Platelets: 221 Thousand/uL (ref 140–400)
RBC: 4.46 Million/uL (ref 4.20–5.80)
RDW: 13.4 % (ref 11.0–15.0)
Total Lymphocyte: 14.2 %
WBC: 10.5 Thousand/uL (ref 3.8–10.8)

## 2024-02-22 LAB — HEMOGLOBIN A1C
Hgb A1c MFr Bld: 6.7 % — ABNORMAL HIGH
Mean Plasma Glucose: 146 mg/dL
eAG (mmol/L): 8.1 mmol/L

## 2024-02-22 LAB — MICROALBUMIN / CREATININE URINE RATIO
Creatinine, Urine: 95 mg/dL (ref 20–320)
Microalb Creat Ratio: 82 mg/g{creat} — ABNORMAL HIGH
Microalb, Ur: 7.8 mg/dL

## 2024-02-22 LAB — LIPID PANEL
Cholesterol: 132 mg/dL
HDL: 37 mg/dL — ABNORMAL LOW
LDL Cholesterol (Calc): 71 mg/dL
Non-HDL Cholesterol (Calc): 95 mg/dL
Total CHOL/HDL Ratio: 3.6 (calc)
Triglycerides: 153 mg/dL — ABNORMAL HIGH

## 2024-02-23 ENCOUNTER — Ambulatory Visit: Payer: Self-pay | Admitting: Family Medicine

## 2024-03-13 NOTE — Progress Notes (Signed)
 Scott Ward                                          MRN: [id number]   03/13/2024   The [facility] Quality Team Specialist reviewed this patient medical record for the purposes of chart review for care gap closure. The following were reviewed: abstraction for care gap closure-kidney health evaluation for diabetes:eGFR  and uACR.    [facility] Quality Team

## 2024-03-19 ENCOUNTER — Other Ambulatory Visit: Payer: Self-pay | Admitting: Family Medicine

## 2024-04-03 ENCOUNTER — Other Ambulatory Visit: Payer: Self-pay | Admitting: Family Medicine

## 2024-08-27 ENCOUNTER — Ambulatory Visit: Admitting: Family Medicine

## 2024-10-10 ENCOUNTER — Ambulatory Visit
